# Patient Record
Sex: Male | Born: 1970 | Race: White | Hispanic: No | Marital: Married | Smoking: Never smoker
Health system: Southern US, Community
[De-identification: ages and names within clinical notes are randomized; demographics above are authoritative.]

## PROBLEM LIST (undated history)

## (undated) DIAGNOSIS — Z9289 Personal history of other medical treatment: Secondary | ICD-10-CM

## (undated) DIAGNOSIS — E785 Hyperlipidemia, unspecified: Secondary | ICD-10-CM

## (undated) DIAGNOSIS — Z8249 Family history of ischemic heart disease and other diseases of the circulatory system: Secondary | ICD-10-CM

## (undated) DIAGNOSIS — I10 Essential (primary) hypertension: Secondary | ICD-10-CM

## (undated) DIAGNOSIS — I2510 Atherosclerotic heart disease of native coronary artery without angina pectoris: Secondary | ICD-10-CM

## (undated) HISTORY — DX: Hyperlipidemia, unspecified: E78.5

## (undated) HISTORY — DX: Personal history of other medical treatment: Z92.89

## (undated) HISTORY — DX: Family history of ischemic heart disease and other diseases of the circulatory system: Z82.49

## (undated) HISTORY — DX: Essential (primary) hypertension: I10

## (undated) HISTORY — DX: Atherosclerotic heart disease of native coronary artery without angina pectoris: I25.10

---
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
## 1994-07-27 HISTORY — PX: HERNIA REPAIR: SHX51

## 2008-07-27 HISTORY — PX: TRANSTHORACIC ECHOCARDIOGRAM: SHX275

## 2008-10-25 HISTORY — PX: CORONARY ANGIOPLASTY WITH STENT PLACEMENT: SHX49

## 2011-06-27 DIAGNOSIS — Z9289 Personal history of other medical treatment: Secondary | ICD-10-CM

## 2011-06-27 HISTORY — DX: Personal history of other medical treatment: Z92.89

## 2012-01-19 ENCOUNTER — Encounter: Payer: Self-pay | Admitting: *Deleted

## 2012-01-19 ENCOUNTER — Other Ambulatory Visit: Payer: Self-pay | Admitting: Cardiology

## 2012-01-19 DIAGNOSIS — R079 Chest pain, unspecified: Secondary | ICD-10-CM

## 2012-01-21 ENCOUNTER — Other Ambulatory Visit (HOSPITAL_COMMUNITY): Payer: Self-pay

## 2013-02-22 ENCOUNTER — Other Ambulatory Visit: Payer: Self-pay | Admitting: Cardiovascular Disease

## 2013-02-22 NOTE — Telephone Encounter (Signed)
Rx was sent to pharmacy electronically. 

## 2013-03-05 ENCOUNTER — Other Ambulatory Visit: Payer: Self-pay | Admitting: Cardiovascular Disease

## 2013-03-06 ENCOUNTER — Other Ambulatory Visit: Payer: Self-pay | Admitting: *Deleted

## 2013-03-06 MED ORDER — PRAVASTATIN SODIUM 80 MG PO TABS: 80.0000 mg | ORAL_TABLET | Freq: Every evening | ORAL | Status: DC

## 2013-03-06 NOTE — Telephone Encounter (Signed)
Rx was sent to pharmacy electronically. 

## 2013-03-17 ENCOUNTER — Encounter: Payer: Self-pay | Admitting: *Deleted

## 2013-03-22 ENCOUNTER — Encounter: Payer: Self-pay | Admitting: Cardiovascular Disease

## 2013-03-23 ENCOUNTER — Encounter: Payer: Self-pay | Admitting: Cardiovascular Disease

## 2013-03-23 ENCOUNTER — Ambulatory Visit (INDEPENDENT_AMBULATORY_CARE_PROVIDER_SITE_OTHER): Payer: No Typology Code available for payment source | Admitting: Cardiovascular Disease

## 2013-03-23 VITALS — BP 126/84 | HR 60 | Resp 14 | Ht 74.0 in | Wt 250.4 lb

## 2013-03-23 DIAGNOSIS — I2510 Atherosclerotic heart disease of native coronary artery without angina pectoris: Secondary | ICD-10-CM

## 2013-03-23 DIAGNOSIS — E785 Hyperlipidemia, unspecified: Secondary | ICD-10-CM

## 2013-03-23 DIAGNOSIS — I10 Essential (primary) hypertension: Secondary | ICD-10-CM

## 2013-03-23 DIAGNOSIS — K429 Umbilical hernia without obstruction or gangrene: Secondary | ICD-10-CM

## 2013-03-23 DIAGNOSIS — E782 Mixed hyperlipidemia: Secondary | ICD-10-CM

## 2013-03-23 DIAGNOSIS — F329 Major depressive disorder, single episode, unspecified: Secondary | ICD-10-CM

## 2013-03-23 DIAGNOSIS — Z79899 Other long term (current) drug therapy: Secondary | ICD-10-CM

## 2013-03-23 MED ORDER — PRAVASTATIN SODIUM 80 MG PO TABS: 80.0000 mg | ORAL_TABLET | Freq: Every evening | ORAL | Status: DC

## 2013-03-23 MED ORDER — LOSARTAN POTASSIUM 50 MG PO TABS: 50.0000 mg | ORAL_TABLET | Freq: Every day | ORAL | Status: DC

## 2013-03-23 MED ORDER — SERTRALINE HCL 100 MG PO TABS: 100.0000 mg | ORAL_TABLET | Freq: Every day | ORAL | Status: DC

## 2013-03-23 NOTE — Patient Instructions (Addendum)
Your physician recommends that you schedule a follow-up appointment in: 12 months Your physician encouraged you to lose weight for better health. Your physician recommends that you return for lab work when fasting

## 2013-03-26 ENCOUNTER — Encounter: Payer: Self-pay | Admitting: Cardiovascular Disease

## 2013-03-26 DIAGNOSIS — F325 Major depressive disorder, single episode, in full remission: Secondary | ICD-10-CM | POA: Insufficient documentation

## 2013-03-26 DIAGNOSIS — E7800 Pure hypercholesterolemia, unspecified: Secondary | ICD-10-CM | POA: Insufficient documentation

## 2013-03-26 DIAGNOSIS — F329 Major depressive disorder, single episode, unspecified: Secondary | ICD-10-CM | POA: Insufficient documentation

## 2013-03-26 DIAGNOSIS — E785 Hyperlipidemia, unspecified: Secondary | ICD-10-CM | POA: Insufficient documentation

## 2013-03-26 DIAGNOSIS — K429 Umbilical hernia without obstruction or gangrene: Secondary | ICD-10-CM

## 2013-03-26 DIAGNOSIS — I2510 Atherosclerotic heart disease of native coronary artery without angina pectoris: Secondary | ICD-10-CM | POA: Insufficient documentation

## 2013-03-26 DIAGNOSIS — F32A Depression, unspecified: Secondary | ICD-10-CM

## 2013-03-26 DIAGNOSIS — F324 Major depressive disorder, single episode, in partial remission: Secondary | ICD-10-CM | POA: Insufficient documentation

## 2013-03-26 DIAGNOSIS — I10 Essential (primary) hypertension: Secondary | ICD-10-CM

## 2013-03-26 HISTORY — DX: Essential (primary) hypertension: I10

## 2013-03-26 HISTORY — DX: Umbilical hernia without obstruction or gangrene: K42.9

## 2013-03-26 HISTORY — DX: Depression, unspecified: F32.A

## 2013-03-26 NOTE — Assessment & Plan Note (Addendum)
Time to reassess lipid levels. Encouraged to lose weight for better health. Discussed diet and exercise.

## 2013-03-26 NOTE — Assessment & Plan Note (Signed)
Asymptomatic. 10/2008 xience 3.5x75mm stent to LAD. Normal LV function.

## 2013-03-26 NOTE — Assessment & Plan Note (Signed)
Good control

## 2013-03-26 NOTE — Assessment & Plan Note (Signed)
Improved. Paradoxically, reduced libido with sertraline has helped his marriage.

## 2013-03-26 NOTE — Progress Notes (Signed)
Patient ID: Aneudy Champlain, male   DOB: [birth year], 42 y.o.   MRN: [id number]     Reason for office visit CAD follow up  Doing well, no angina. Had a recent respiratory infection and developed a small umbilical hernia secondary to coughing. Relatively sedentary, no symptoms mowing the lawn with a push mower.    Allergies  Allergen Reactions  . Simvastatin Other (See Comments)    Myalgias     Current Outpatient Prescriptions  Medication Sig Dispense Refill  . aspirin 325 MG tablet Take 325 mg by mouth daily.      Marland Kitchen losartan (COZAAR) 50 MG tablet Take 1 tablet (50 mg total) by mouth daily.  90 tablet  3  . Multiple Vitamins-Minerals (MULTIVITAMIN PO) Take by mouth daily.      . nitroGLYCERIN (NITROSTAT) 0.4 MG SL tablet Place 0.4 mg under the tongue every 5 (five) minutes as needed for chest pain.      . pravastatin (PRAVACHOL) 80 MG tablet Take 1 tablet (80 mg total) by mouth every evening.  90 tablet  3  . sertraline (ZOLOFT) 100 MG tablet Take 1 tablet (100 mg total) by mouth daily.  90 tablet  3   No current facility-administered medications for this visit.    Past Medical History  Diagnosis Date  . Family history of heart disease   . CAD (coronary artery disease)   . Hyperlipidemia   . Hypertension   . History of nuclear stress test 06/2011    exercise; negative for ischemia, normal pattern of perfusion     Past Surgical History  Procedure Laterality Date  . Coronary angioplasty with stent placement  10/2008    xience 3.5x22mm stent to LAD [facility])  . Appendectomy  1977  . Hernia repair  1996  . Transthoracic echocardiogram  2010    EF ~65%; borderline LA enlargement     Family History  Problem Relation Age of Onset  . Heart disease Father     CABG at 12  . Heart disease Paternal Grandmother   . Heart disease Paternal Grandfather     History   Social History  . Marital Status: Married    Spouse Name: N/A    Number of Children: 3  . Years of  Education: N/A   Occupational History  . Not on file.   Social History Main Topics  . Smoking status: Never Smoker   . Smokeless tobacco: Never Used  . Alcohol Use: Yes     Comment: occasional   . Drug Use: No  . Sexual Activity: Not on file   Other Topics Concern  . Not on file   Social History Narrative  . No narrative on file    Review of systems: The patient specifically denies any chest pain at rest exertion, dyspnea at rest or with exertion, orthopnea, paroxysmal nocturnal dyspnea, syncope, palpitations, focal neurological deficits, intermittent claudication, lower extremity edema, unexplained weight gain, cough, hemoptysis or wheezing.   PHYSICAL EXAM BP 126/84  Pulse 60  Resp 14  Ht 6\' 2"  (1.88 m)  Wt 250 lb 6.4 oz (113.581 kg)  BMI 32.14 kg/m2  General: Alert, oriented x3, no distress Head: no evidence of trauma, PERRL, EOMI, no exophtalmos or lid lag, no myxedema, no xanthelasma; normal ears, nose and oropharynx Neck: normal jugular venous pulsations and no hepatojugular reflux; brisk carotid pulses without delay and no carotid bruits Chest: clear to auscultation, no signs of consolidation by percussion or palpation, normal fremitus, symmetrical and  full respiratory excursions Cardiovascular: normal position and quality of the apical impulse, regular rhythm, normal first and second heart sounds, no murmurs, rubs or gallops Abdomen: no tenderness or distention, no masses by palpation, no abnormal pulsatility or arterial bruits, normal bowel sounds, no hepatosplenomegaly. Tiny readily reducible umbilical hernia, evident with cough. Extremities: no clubbing, cyanosis or edema; 2+ radial, ulnar and brachial pulses bilaterally; 2+ right femoral, posterior tibial and dorsalis pedis pulses; 2+ left femoral, posterior tibial and dorsalis pedis pulses; no subclavian or femoral bruits Neurological: grossly nonfocal   EKG: NSR, normal   ASSESSMENT AND PLAN HTN  (hypertension) Good control.  Hyperlipidemia Time to reassess lipid levels. Encouraged to lose weight for better health. Discussed diet and exercise.  Depression Improved. Paradoxically, reduced libido with sertraline has helped his marriage.  CAD (coronary artery disease) Asymptomatic. 10/2008 xience 3.5x52mm stent to LAD. Normal LV function.  Umbilical hernia Small, no enteric content. Observe. Avoid excessive straining.   Orders Placed This Encounter  Procedures  . Comp Met (CMET)  . Lipid Profile  . EKG 12-Lead   Meds ordered this encounter  Medications  . losartan (COZAAR) 50 MG tablet    Sig: Take 1 tablet (50 mg total) by mouth daily.    Dispense:  90 tablet    Refill:  3    Patient needs to call our office to schedule an appointment.  . sertraline (ZOLOFT) 100 MG tablet    Sig: Take 1 tablet (100 mg total) by mouth daily.    Dispense:  90 tablet    Refill:  3  . pravastatin (PRAVACHOL) 80 MG tablet    Sig: Take 1 tablet (80 mg total) by mouth every evening.    Dispense:  90 tablet    Refill:  3    Patient needs appointment for future refills.    Junious Silk, MD, [facility] [phone] office [phone] pager

## 2013-03-26 NOTE — Assessment & Plan Note (Signed)
Small, no enteric content. Observe. Avoid excessive straining.

## 2013-03-30 ENCOUNTER — Other Ambulatory Visit: Payer: Self-pay | Admitting: Cardiovascular Disease

## 2013-03-31 NOTE — Telephone Encounter (Signed)
Rx was sent to pharmacy electronically. 

## 2013-06-18 ENCOUNTER — Other Ambulatory Visit: Payer: Self-pay | Admitting: Cardiovascular Disease

## 2013-06-19 NOTE — Telephone Encounter (Signed)
Rx was sent to pharmacy electronically. 

## 2013-08-29 ENCOUNTER — Other Ambulatory Visit: Payer: Self-pay | Admitting: Cardiovascular Disease

## 2013-08-29 NOTE — Telephone Encounter (Signed)
Please advise 

## 2013-11-21 ENCOUNTER — Telehealth: Payer: Self-pay | Admitting: Cardiovascular Disease

## 2013-11-21 NOTE — Telephone Encounter (Signed)
Returned call and pt verified x 2.  Pt stated losartan doesn't seem to be working as effective.  Stated BP has been around 155/95.  Stated he has noticed the increase over the past week.  Denied CP, SOB, HA, dizziness or other symptoms.   Advised BP check since pt has not been seen since August 2014.  Pt agreed and appt scheduled for tomorrow (4/29) at 2:40pm w/ PharmD.  Pt agreed to bring in a list of ALL meds Rx and OTC and BP monitor.

## 2013-11-21 NOTE — Telephone Encounter (Signed)
Please call- question about his Losartan.

## 2013-11-22 ENCOUNTER — Encounter: Payer: Self-pay | Admitting: Pharmacist Clinician (PhC)/ Clinical Pharmacy Specialist

## 2013-11-22 ENCOUNTER — Ambulatory Visit (INDEPENDENT_AMBULATORY_CARE_PROVIDER_SITE_OTHER)
Payer: No Typology Code available for payment source | Admitting: Pharmacist Clinician (PhC)/ Clinical Pharmacy Specialist

## 2013-11-22 VITALS — BP 138/90 | HR 68 | Ht 74.0 in | Wt 247.4 lb

## 2013-11-22 DIAGNOSIS — I10 Essential (primary) hypertension: Secondary | ICD-10-CM

## 2013-11-22 MED ORDER — LOSARTAN POTASSIUM 50 MG PO TABS: 50.0000 mg | ORAL_TABLET | Freq: Two times a day (BID) | ORAL | Status: DC

## 2013-11-22 NOTE — Patient Instructions (Signed)
Return for a a follow up appointment in 1 month  Your blood pressure today is 138\90  Check your blood pressure at home daily (if able) and keep record of the readings.  Take your BP meds as follows - increase losartan to 50mg  twice daily  Bring all of your meds, your BP cuff and your record of home blood pressures to your next appointment.  Exercise as you're able, try to walk approximately 30 minutes per day.  Keep salt intake to a minimum, especially watch canned and prepared boxed foods.  Eat more fresh fruits and vegetables and fewer canned items.  Avoid eating in fast food restaurants.    HOW TO TAKE YOUR BLOOD PRESSURE:   Rest 5 minutes before taking your blood pressure.    Don't smoke or drink caffeinated beverages for at least 30 minutes before.   Take your blood pressure before (not after) you eat.   Sit comfortably with your back supported and both feet on the floor (don't cross your legs).   Elevate your arm to heart level on a table or a desk.   Use the proper sized cuff. It should fit smoothly and snugly around your bare upper arm. There should be enough room to slip a fingertip under the cuff. The bottom edge of the cuff should be 1 inch above the crease of the elbow.   Ideally, take 3 measurements at one sitting and record the average.

## 2013-11-22 NOTE — Progress Notes (Signed)
     11/22/2013 Francisco White 1970/11/11 [id number]   HPI:  Francisco White is a 43 y.o. male patient of Dr.Croitoru, with a PMH below who presents today for a blood pressure check.  He has been treated for hypertension with losartan 50mg  since 2012 and only just recently noticed that his home blood pressure readings were trending into the 150s/90s.  He denies any CP, SOB, HA or dizziness.  He comes into the office today with his home BP machine.  He does not use any tobacco products, but does admit to drinking 3-4 beers and 2-3 cups of coffee per day.  Most of his meals are eaten at home, and while his mother-in-law, who does the cooking, does not add salt, he does keep a salt shaker on the table to salt his own food.  His family history is significant in that both his father and paternal grandfather both had hypertension.  He gets no regular exercise other than some evening walks with his wife and mowing his lawn.  He owns his own business and states that it has been more stressful in the past month or two.   Current Outpatient Prescriptions  Medication Sig Dispense Refill  . aspirin 325 MG tablet Take 325 mg by mouth daily.      Marland Kitchen. losartan (COZAAR) 50 MG tablet TAKE 1 TABLET BY MOUTH DAILY  30 tablet  6  . nitroGLYCERIN (NITROSTAT) 0.4 MG SL tablet Place 0.4 mg under the tongue every 5 (five) minutes as needed for chest pain.      . pravastatin (PRAVACHOL) 80 MG tablet TAKE 1 TABLET BY MOUTH EVERY EVENING  90 tablet  2  . sertraline (ZOLOFT) 100 MG tablet TAKE ONE TABLET BY MOUTH DAILY  90 tablet  2   No current facility-administered medications for this visit.    Allergies  Allergen Reactions  . Simvastatin Other (See Comments)    Myalgias     Past Medical History  Diagnosis Date  . Family history of heart disease   . CAD (coronary artery disease)   . Hyperlipidemia   . Hypertension   . History of nuclear stress test 06/2011    exercise; negative for ischemia, normal pattern of  perfusion     Blood pressure 138/90, pulse 68, height 6\' 2"  (1.88 m), weight 247 lb 6.4 oz (112.22 kg).   ASSESSMENT AND PLAN: Today [name]'s blood pressure is actually just within guidelines at 138/90.  His home BP cuff read about 10 points higher at 148/99.  We had a long discussion about the changes in BP with cuts in sodium and increases in exercise.  I encouraged him to work on these areas  However I find that losartan is usually not effective for a full 24 hours, so I will go ahead and increase his dose to 50mg  twice daily.  I have asked that he continue with home blood pressure checks and return in one month.    Phillips HayKristin Alvstad PharmD CPP [facility]

## 2014-01-22 ENCOUNTER — Other Ambulatory Visit: Payer: Self-pay | Admitting: *Deleted

## 2014-01-28 ENCOUNTER — Other Ambulatory Visit: Payer: Self-pay | Admitting: Cardiovascular Disease

## 2014-01-29 NOTE — Telephone Encounter (Signed)
Rx was sent to pharmacy electronically. 

## 2014-02-01 ENCOUNTER — Telehealth: Payer: Self-pay | Admitting: Cardiovascular Disease

## 2014-02-01 ENCOUNTER — Other Ambulatory Visit: Payer: Self-pay | Admitting: *Deleted

## 2014-02-01 MED ORDER — LOSARTAN POTASSIUM 50 MG PO TABS: 50.0000 mg | ORAL_TABLET | Freq: Two times a day (BID) | ORAL | Status: DC

## 2014-02-01 NOTE — Telephone Encounter (Signed)
Pt still have not received his Losartan,this have been going on for quite a while. [facility] [address] [facility]

## 2014-04-15 ENCOUNTER — Other Ambulatory Visit: Payer: Self-pay | Admitting: Cardiovascular Disease

## 2014-04-16 NOTE — Telephone Encounter (Signed)
Rx was sent to pharmacy electronically. 

## 2014-05-11 LAB — COMPREHENSIVE METABOLIC PANEL
ALK PHOS: 49 U/L (ref 39–117)
ALT: 22 U/L (ref 0–53)
AST: 20 U/L (ref 0–37)
Albumin: 4.7 g/dL (ref 3.5–5.2)
BUN: 15 mg/dL (ref 6–23)
CO2: 26 mEq/L (ref 19–32)
CREATININE: 1.16 mg/dL (ref 0.50–1.35)
Calcium: 9.8 mg/dL (ref 8.4–10.5)
Chloride: 105 mEq/L (ref 96–112)
GLUCOSE: 95 mg/dL (ref 70–99)
Potassium: 4.1 mEq/L (ref 3.5–5.3)
Sodium: 141 mEq/L (ref 135–145)
Total Bilirubin: 0.8 mg/dL (ref 0.2–1.2)
Total Protein: 6.9 g/dL (ref 6.0–8.3)

## 2014-07-02 ENCOUNTER — Other Ambulatory Visit: Payer: Self-pay | Admitting: Cardiovascular Disease

## 2014-07-02 MED ORDER — PRAVASTATIN SODIUM 80 MG PO TABS
80.0000 mg | ORAL_TABLET | Freq: Every evening | ORAL | Status: DC
Start: 2014-07-02 — End: 2014-07-06

## 2014-07-02 NOTE — Telephone Encounter (Signed)
Rx was sent to pharmacy electronically. 

## 2014-07-04 ENCOUNTER — Ambulatory Visit (INDEPENDENT_AMBULATORY_CARE_PROVIDER_SITE_OTHER): Payer: No Typology Code available for payment source | Admitting: Cardiovascular Disease

## 2014-07-04 ENCOUNTER — Encounter: Payer: Self-pay | Admitting: Cardiovascular Disease

## 2014-07-04 VITALS — BP 114/72 | HR 66 | Resp 16 | Ht 74.0 in | Wt 251.3 lb

## 2014-07-04 DIAGNOSIS — E7800 Pure hypercholesterolemia, unspecified: Secondary | ICD-10-CM

## 2014-07-04 DIAGNOSIS — F329 Major depressive disorder, single episode, unspecified: Secondary | ICD-10-CM

## 2014-07-04 DIAGNOSIS — E785 Hyperlipidemia, unspecified: Secondary | ICD-10-CM

## 2014-07-04 DIAGNOSIS — F32A Depression, unspecified: Secondary | ICD-10-CM

## 2014-07-04 DIAGNOSIS — I10 Essential (primary) hypertension: Secondary | ICD-10-CM

## 2014-07-04 DIAGNOSIS — I2510 Atherosclerotic heart disease of native coronary artery without angina pectoris: Secondary | ICD-10-CM

## 2014-07-04 NOTE — Patient Instructions (Addendum)
Your physician recommends that you return for lab work in: FASTING.  Your physician has requested that you have an exercise tolerance test. For further information please visit [email or web address]. Please also follow instruction sheet, as given. (order given after patient had left the office).  Scheduling will call to arrange.  Dr. Royann Shiversroitoru recommends that you schedule a follow-up appointment in: One year.

## 2014-07-05 LAB — LIPID PANEL
CHOL/HDL RATIO: 4.2 ratio
Cholesterol: 211 mg/dL — ABNORMAL HIGH (ref 0–200)
HDL: 50 mg/dL (ref >39)
LDL CALC: 113 mg/dL — AB (ref 0–99)
Triglycerides: 239 mg/dL — ABNORMAL HIGH (ref <150)
VLDL: 48 mg/dL — AB (ref 0–40)

## 2014-07-06 ENCOUNTER — Telehealth: Payer: Self-pay | Admitting: *Deleted

## 2014-07-06 ENCOUNTER — Encounter: Payer: Self-pay | Admitting: Cardiovascular Disease

## 2014-07-06 MED ORDER — ATORVASTATIN CALCIUM 40 MG PO TABS: 40.0000 mg | ORAL_TABLET | Freq: Every day | ORAL | Status: DC

## 2014-07-06 NOTE — Progress Notes (Signed)
Patient ID: Francisco White, male   DOB: 04-26-1971, 43 y.o.   MRN: [id number]     Reason for office visit CAD, HTN, Hyperlipidemia  No recent changes in [name]'s health. He has infrequent and inconsistent cardiac complaints. Mows his own lawn with a push mower with mild dyspnea and chest tightness, different from previous angina (his previous angina was "indigestion"). His depression is well controlled. He remains mildly obese.  Received a drug eluting 3.5x18 mm Xience LAD stent in April 2010.   Allergies  Allergen Reactions  . Simvastatin Other (See Comments)    Myalgias     Current Outpatient Prescriptions  Medication Sig Dispense Refill  . aspirin 325 MG tablet Take 325 mg by mouth daily.    Marland Kitchen. losartan (COZAAR) 50 MG tablet Take 1 tablet (50 mg total) by mouth 2 (two) times daily. 180 tablet 1  . losartan (COZAAR) 50 MG tablet Take 1 tablet (50 mg total) by mouth 2 (two) times daily. 180 tablet 3  . sertraline (ZOLOFT) 100 MG tablet TAKE ONE TABLET BY MOUTH DAILY 90 tablet 2  . atorvastatin (LIPITOR) 40 MG tablet Take 1 tablet (40 mg total) by mouth daily. 30 tablet 6   No current facility-administered medications for this visit.    Past Medical History  Diagnosis Date  . Family history of heart disease   . CAD (coronary artery disease)   . Hyperlipidemia   . Hypertension   . History of nuclear stress test 06/2011    exercise; negative for ischemia, normal pattern of perfusion     Past Surgical History  Procedure Laterality Date  . Coronary angioplasty with stent placement  10/2008    xience 3.5x11018mm stent to LAD [facility])  . Appendectomy  1977  . Hernia repair  1996  . Transthoracic echocardiogram  2010    EF ~65%; borderline LA enlargement     Family History  Problem Relation Age of Onset  . Heart disease Father     CABG at 652  . Heart disease Paternal Grandmother   . Heart disease Paternal Grandfather     History   Social History  . Marital  Status: Married    Spouse Name: N/A    Number of Children: 3  . Years of Education: N/A   Occupational History  . Not on file.   Social History Main Topics  . Smoking status: Never Smoker   . Smokeless tobacco: Never Used  . Alcohol Use: Yes     Comment: occasional   . Drug Use: No  . Sexual Activity: Not on file   Other Topics Concern  . Not on file   Social History Narrative    Review of systems: The patient specifically denies any chest pain at rest or with exertion, dyspnea at rest or with exertion, orthopnea, paroxysmal nocturnal dyspnea, syncope, palpitations, focal neurological deficits, intermittent claudication, lower extremity edema, unexplained weight gain, cough, hemoptysis or wheezing.  The patient also denies abdominal pain, nausea, vomiting, dysphagia, diarrhea, constipation, polyuria, polydipsia, dysuria, hematuria, frequency, urgency, abnormal bleeding or bruising, fever, chills, unexpected weight changes, mood swings, change in skin or hair texture, change in voice quality, auditory or visual problems, allergic reactions or rashes, new musculoskeletal complaints other than usual "aches and pains".   PHYSICAL EXAM BP 114/72 mmHg  Pulse 66  Ht 6\' 2"  (1.88 m)  Wt 251 lb 4.8 oz (113.989 kg)  BMI 32.25 kg/m2  General: Alert, oriented x3, no distress Head: no evidence of trauma,  PERRL, EOMI, no exophtalmos or lid lag, no myxedema, no xanthelasma; normal ears, nose and oropharynx Neck: normal jugular venous pulsations and no hepatojugular reflux; brisk carotid pulses without delay and no carotid bruits Chest: clear to auscultation, no signs of consolidation by percussion or palpation, normal fremitus, symmetrical and full respiratory excursions Cardiovascular: normal position and quality of the apical impulse, regular rhythm, normal first and second heart sounds, no murmurs, rubs or gallops Abdomen: no tenderness or distention, no masses by palpation, no abnormal  pulsatility or arterial bruits, normal bowel sounds, no hepatosplenomegaly, small reducible periumbilical hernia. Extremities: no clubbing, cyanosis or edema; 2+ radial, ulnar and brachial pulses bilaterally; 2+ right femoral, posterior tibial and dorsalis pedis pulses; 2+ left femoral, posterior tibial and dorsalis pedis pulses; no subclavian or femoral bruits Neurological: grossly nonfocal   EKG: NSR, normal  Lipid Panel     Component Value Date/Time   CHOL 211* 07/04/2014 1207   TRIG 239* 07/04/2014 1207   HDL 50 07/04/2014 1207   CHOLHDL 4.2 07/04/2014 1207   VLDL 48* 07/04/2014 1207   LDLCALC 113* 07/04/2014 1207    BMET    Component Value Date/Time   NA 141 05/11/2014 0802   K 4.1 05/11/2014 0802   CL 105 05/11/2014 0802   CO2 26 05/11/2014 0802   GLUCOSE 95 05/11/2014 0802   BUN 15 05/11/2014 0802   CREATININE 1.16 05/11/2014 0802   CALCIUM 9.8 05/11/2014 0802     ASSESSMENT AND PLAN Hyperlipidemia Switch to atorvastatin 40 mg daily, target LDL preferred<70, definitely want <100.. Encouraged to lose weight for better health. Discussed diet and exercise.  Depression Improved. Stable on sertraline.  CAD (coronary artery disease)  10/2008 xience 3.5x5618mm stent to LAD. Normal LV function. Reasonable to perform ECG stress test for his recent symptoms.  Umbilical hernia Small, no enteric content. Observe.  Orders Placed This Encounter  Procedures  . Lipid panel  . EKG 12-Lead  . Exercise Tolerance Test   No orders of the defined types were placed in this encounter.    Junious SilkROITORU,Rivkah Wolz  Aran Menning, MD, [facility] [phone] office [phone] pager

## 2014-07-06 NOTE — Telephone Encounter (Signed)
-----   Message from Thurmon FairMihai Croitoru, MD sent at 07/05/2014  9:10 PM EST ----- Not happy with lab results. Both LDL and TG are way too high prevent new coronary blockages.Best solution is to switch to Crestor 20 mg daily (more expensive, but He was intolerant of simvastatin and will likely not tolerate atorvastatin either). If he agrees, prescribe, send him a discount card and recheck in 3 months.

## 2014-07-06 NOTE — Telephone Encounter (Signed)
Lab results called to patient.  Patient is self insured and can't afford Crestor.  Would like to try atorvastatin.  Message to Dr. Salena Saner - instructed to switch to atorvastatin 40mg  daily.  [facility]

## 2014-07-06 NOTE — Telephone Encounter (Signed)
Returning your call. This note should have been added to original note.

## 2014-07-08 ENCOUNTER — Other Ambulatory Visit: Payer: Self-pay | Admitting: Cardiovascular Disease

## 2014-08-01 ENCOUNTER — Telehealth (HOSPITAL_COMMUNITY): Payer: Self-pay

## 2014-08-01 NOTE — Telephone Encounter (Signed)
Encounter complete. 

## 2014-08-03 ENCOUNTER — Ambulatory Visit (HOSPITAL_COMMUNITY)
Admission: RE | Admit: 2014-08-03 | Discharge: 2014-08-03 | Disposition: A | Discharge status: Home / Self Care | Payer: No Typology Code available for payment source | Source: Ambulatory Visit | Attending: Cardiology | Admitting: Cardiology

## 2014-08-03 DIAGNOSIS — R5383 Other fatigue: Secondary | ICD-10-CM | POA: Diagnosis not present

## 2014-08-03 DIAGNOSIS — R079 Chest pain, unspecified: Secondary | ICD-10-CM | POA: Diagnosis present

## 2014-08-03 DIAGNOSIS — I2510 Atherosclerotic heart disease of native coronary artery without angina pectoris: Secondary | ICD-10-CM

## 2014-08-03 NOTE — Procedures (Signed)
Exercise Treadmill Test  Pre-Exercise Testing Evaluation NSR, normal tracing  Test  Exercise Tolerance Test Ordering MD: M. Raeleigh Guinn, MD  Interpreting MD:   Unique Test No: 1 Treadmill:  1  Indication for ETT: chest pain - rule out ischemia Contraindication to ETT: No   Stress Modality: exercise - treadmill  Cardiac Imaging Performed: non   Protocol: standard Bruce - maximal  Max BP:  210/74  Max MPHR (bpm): 175 85% MPR (bpm):  148  MPHR obtained (bpm):  169 % MPHR obtained: 96  Reached 85% MPHR (min:sec):  9:15 Total Exercise Time (min-sec):  11:02  Workload in METS: 13.50 Borg Scale:   Reason ETT Terminated:  fatigue    ST Segment Analysis At Rest: normal ST segments - no evidence of significant ST depression With Exercise: no evidence of significant ST depression  Other Information Arrhythmia:  No Angina during ETT:  absent (0) Quality of ETT:  diagnostic  ETT Interpretation:  normal - no evidence of ischemia by ST analysis  Comments: Good exercise tolerance. Normal BP response to exercise.  Exercise tolerance is slightly better compared to 2012.  Thurmon FairMihai Sedale Jenifer, MD, [facility] [phone] office [phone] pager

## 2014-08-21 ENCOUNTER — Telehealth: Payer: Self-pay | Admitting: Cardiovascular Disease

## 2014-08-21 NOTE — Telephone Encounter (Signed)
Normal ETT results given.  Patient states he was expecting the results last week.  I apologized and suggested he call anytime he feels he should be getting any results. Once again, apologized.

## 2014-08-21 NOTE — Telephone Encounter (Signed)
Pt called in stating that he had a stress test done on 1/8 and would like to know the results. Please f/u with pt  Thanks

## 2014-08-21 NOTE — Telephone Encounter (Signed)
Forward to Huntsman CorporationBARBARA LASSISTER CMA

## 2014-08-22 NOTE — Telephone Encounter (Signed)
Can this encounter be closed?

## 2014-08-31 NOTE — Telephone Encounter (Signed)
Can this encounter be closed?

## 2015-01-26 ENCOUNTER — Other Ambulatory Visit: Payer: Self-pay | Admitting: Cardiovascular Disease

## 2015-01-29 NOTE — Telephone Encounter (Signed)
REFILL 

## 2015-02-11 ENCOUNTER — Other Ambulatory Visit: Payer: Self-pay

## 2015-02-11 MED ORDER — LOSARTAN POTASSIUM 50 MG PO TABS: 50.0000 mg | ORAL_TABLET | Freq: Two times a day (BID) | ORAL | Status: DC

## 2015-02-11 NOTE — Telephone Encounter (Signed)
Rx(s) sent to pharmacy electronically.  

## 2015-02-12 ENCOUNTER — Telehealth: Payer: Self-pay | Admitting: Cardiovascular Disease

## 2015-02-12 NOTE — Telephone Encounter (Signed)
Spoke with dawn, the patient is needing clearance to hold his 325 mg aspirin 5 to 7 days prior to vasectomy. They are planning on doing surgery 03/08/15, pending clearance. Aware dr croitoru is out and will return in 2 weeks. She is going to fax clearance form for review. Form received and placed in dr croitoru's box for review when he returns.

## 2015-02-26 ENCOUNTER — Encounter: Payer: Self-pay | Admitting: Cardiovascular Disease

## 2015-03-06 ENCOUNTER — Other Ambulatory Visit: Payer: Self-pay | Admitting: Cardiovascular Disease

## 2015-03-06 NOTE — Telephone Encounter (Signed)
Rx request has been sent to the pharmacy.

## 2015-06-08 ENCOUNTER — Other Ambulatory Visit: Payer: Self-pay | Admitting: Cardiovascular Disease

## 2015-06-11 NOTE — Telephone Encounter (Signed)
Pt requesting a refill on this medication. Please advise °

## 2015-06-11 NOTE — Telephone Encounter (Signed)
Deferred to Dr. Salena Saner for advise

## 2015-07-16 ENCOUNTER — Other Ambulatory Visit: Payer: Self-pay

## 2015-07-16 MED ORDER — ATORVASTATIN CALCIUM 40 MG PO TABS
40.0000 mg | ORAL_TABLET | Freq: Every day | ORAL | Status: DC
Start: 2015-07-16 — End: 2015-08-31

## 2015-08-31 ENCOUNTER — Other Ambulatory Visit: Payer: Self-pay | Admitting: Cardiovascular Disease

## 2015-09-02 NOTE — Telephone Encounter (Signed)
Rx(s) sent to pharmacy electronically.  

## 2015-09-23 ENCOUNTER — Other Ambulatory Visit: Payer: Self-pay | Admitting: Cardiovascular Disease

## 2015-09-24 NOTE — Telephone Encounter (Signed)
REFILL 

## 2015-09-30 ENCOUNTER — Other Ambulatory Visit: Payer: Self-pay | Admitting: Cardiovascular Disease

## 2015-10-02 ENCOUNTER — Ambulatory Visit (INDEPENDENT_AMBULATORY_CARE_PROVIDER_SITE_OTHER): Payer: No Typology Code available for payment source | Admitting: Cardiovascular Disease

## 2015-10-02 ENCOUNTER — Encounter: Payer: Self-pay | Admitting: Cardiovascular Disease

## 2015-10-02 VITALS — BP 152/100 | HR 60 | Ht 74.0 in | Wt 251.6 lb

## 2015-10-02 DIAGNOSIS — F329 Major depressive disorder, single episode, unspecified: Secondary | ICD-10-CM | POA: Diagnosis not present

## 2015-10-02 DIAGNOSIS — E785 Hyperlipidemia, unspecified: Secondary | ICD-10-CM

## 2015-10-02 DIAGNOSIS — I2510 Atherosclerotic heart disease of native coronary artery without angina pectoris: Secondary | ICD-10-CM | POA: Diagnosis not present

## 2015-10-02 DIAGNOSIS — I10 Essential (primary) hypertension: Secondary | ICD-10-CM

## 2015-10-02 DIAGNOSIS — Z79899 Other long term (current) drug therapy: Secondary | ICD-10-CM

## 2015-10-02 DIAGNOSIS — F32A Depression, unspecified: Secondary | ICD-10-CM

## 2015-10-02 LAB — COMPREHENSIVE METABOLIC PANEL
ALK PHOS: 54 U/L (ref 40–115)
ALT: 26 U/L (ref 9–46)
AST: 19 U/L (ref 10–40)
Albumin: 4.7 g/dL (ref 3.6–5.1)
BILIRUBIN TOTAL: 0.8 mg/dL (ref 0.2–1.2)
BUN: 14 mg/dL (ref 7–25)
CO2: 27 mmol/L (ref 20–31)
CREATININE: 1.3 mg/dL (ref 0.60–1.35)
Calcium: 9.7 mg/dL (ref 8.6–10.3)
Chloride: 103 mmol/L (ref 98–110)
GLUCOSE: 87 mg/dL (ref 65–99)
Potassium: 4.3 mmol/L (ref 3.5–5.3)
SODIUM: 138 mmol/L (ref 135–146)
Total Protein: 6.9 g/dL (ref 6.1–8.1)

## 2015-10-02 LAB — LIPID PANEL
CHOL/HDL RATIO: 4 ratio (ref <=5.0)
CHOLESTEROL: 170 mg/dL (ref 125–200)
HDL: 43 mg/dL (ref >=40)
LDL Cholesterol: 99 mg/dL (ref <130)
Triglycerides: 140 mg/dL (ref <150)
VLDL: 28 mg/dL (ref <30)

## 2015-10-02 MED ORDER — LOSARTAN POTASSIUM-HCTZ 100-12.5 MG PO TABS: 1.0000 | ORAL_TABLET | Freq: Every day | ORAL | Status: DC

## 2015-10-02 MED ORDER — ATORVASTATIN CALCIUM 40 MG PO TABS: 40.0000 mg | ORAL_TABLET | Freq: Every day | ORAL | Status: DC

## 2015-10-02 NOTE — Progress Notes (Signed)
Patient ID: Francisco White, male   DOB: [birth year], 45 y.o.   MRN: [id number]    Cardiology Office Note    Date:  10/02/2015   ID:  Francisco White, DOB [birth year], MRN [id number]  PCP:  No PCP Per Patient  Cardiologist:   Sanda Klein, MD   Chief Complaint  Patient presents with  . Annual Exam    no chest pain, no shortness of breath, no edema, no pain or cramping in legs, no lightheadedness or dizziness, no fatigue    History of Present Illness:  Francisco White is a 45 y.o. male with a history of coronary artery disease, now 7 years after placement of a drug-eluting stent to the LAD artery. He has hypertension and hyperlipidemia and has had problems with depression.  He has no cardiovascular complaints. He occasionally exercises on his treadmill and does not have angina or dyspnea. He admits he should exercise more regularly. His weight is essentially unchanged over the last 3 years. Denies palpitations, edema, claudication or focal neurological complaints.  He recently had a vasectomy and has a lot of residual pain. His wife has been diagnosed with BRCA positive cancer of the breast and is currently undergoing neoadjuvant chemotherapy, with a lot of emotional issues at home. Personally, he finds that his mood is stable and other than "road rage" has not had any outbursts of depression or other major psychological issues.  He had a normal treadmill stress test in January 2016 when he was able to exercise for over 11 minutes.  Past Medical History  Diagnosis Date  . Family history of heart disease   . CAD (coronary artery disease)   . Hyperlipidemia   . Hypertension   . History of nuclear stress test 06/2011    exercise; negative for ischemia, normal pattern of perfusion     Past Surgical History  Procedure Laterality Date  . Coronary angioplasty with stent placement  10/2008    xience 3.5x83m stent to LAD ([facility])  . Appendectomy  1977  . Hernia repair  1996  .  Transthoracic echocardiogram  2010    EF ~65%; borderline LA enlargement     Outpatient Prescriptions Prior to Visit  Medication Sig Dispense Refill  . aspirin 325 MG tablet Take 325 mg by mouth daily.    .Marland Kitchenatorvastatin (LIPITOR) 40 MG tablet Take 1 tablet (40 mg total) by mouth daily at 6 PM. NEED OV. 7 tablet 0  . losartan (COZAAR) 50 MG tablet Take 1 tablet (50 mg total) by mouth 2 (two) times daily. 180 tablet 1  . sertraline (ZOLOFT) 100 MG tablet TAKE ONE TABLET BY MOUTH DAILY. NEED OFFICE VISIT FOR MORE REFILLS. 60 tablet 3   No facility-administered medications prior to visit.     Allergies:   Simvastatin   Social History   Social History  . Marital Status: Married    Spouse Name: N/A  . Number of Children: 3  . Years of Education: N/A   Social History Main Topics  . Smoking status: Never Smoker   . Smokeless tobacco: Never Used  . Alcohol Use: Yes     Comment: occasional   . Drug Use: No  . Sexual Activity: Not Asked   Other Topics Concern  . None   Social History Narrative     Family History:  The patient's family history includes Heart disease in his father, paternal grandfather, and paternal grandmother.   ROS:   Please see the history of present illness.  ROS All other systems reviewed and are negative.   PHYSICAL EXAM:   VS:  There were no vitals taken for this visit.   GEN: Well nourished, well developed, in no acute distress HEENT: normal Neck: no JVD, carotid bruits, or masses Cardiac: RRR; no murmurs, rubs, or gallops,no edema  Respiratory:  clear to auscultation bilaterally, normal work of breathing GI: soft, nontender, nondistended, + BS MS: no deformity or atrophy Skin: warm and dry, no rash Neuro:  Alert and Oriented x 3, Strength and sensation are intact Psych: euthymic mood, full affect  Wt Readings from Last 3 Encounters:  07/04/14 113.989 kg (251 lb 4.8 oz)  11/22/13 112.22 kg (247 lb 6.4 oz)  03/23/13 113.581 kg (250 lb 6.4  oz)      Studies/Labs Reviewed:   EKG:  EKG is ordered today.  The ekg ordered today demonstrates normal sinus rhythm, normal tracing  Recent Labs: No results found for requested labs within last 365 days.   Lipid Panel    Component Value Date/Time   CHOL 211* 07/04/2014 1207   TRIG 239* 07/04/2014 1207   HDL 50 07/04/2014 1207   CHOLHDL 4.2 07/04/2014 1207   VLDL 48* 07/04/2014 1207   LDLCALC 113* 07/04/2014 1207      ASSESSMENT:    1. Coronary artery disease involving native coronary artery of native heart without angina pectoris   2. Essential hypertension   3. Hyperlipidemia   4. Depression      PLAN:  In order of problems listed above:  1. CAD: Asymptomatic. Focus on risk factor modification. Lose weight, improve blood pressure control, reevaluate lipids, exercise regularly 2. HTN: His blood pressure severely elevated today, also bleeding in part due to pain and due to emotional distress. Nevertheless even when he is relaxed at home his systolic blood pressure is elevated. Will switch to losartan-HCT 100/12.5 and asked him to come back in a month with his home blood pressure monitors to make sure they're adequately calibrated. 3. HLP: The current lipid profile was performed before we switched to atorvastatin. Will reevaluate. Target LDL less than 100, preferably less than 70. Again, weight loss and regular physical exercise be highly beneficial. 4. seems to be very well compensated on current medications. Would advise against discontinuing the SSRI until his home situation is calmer.    Medication Adjustments/Labs and Tests Ordered: Current medicines are reviewed at length with the patient today.  Concerns regarding medicines are outlined above.  Medication changes, Labs and Tests ordered today are listed in the Patient Instructions below. There are no Patient Instructions on file for this visit.     Mikael Spray, MD  10/02/2015 8:24 AM    [facility] [address], [place], [place]  [address] Phone: [phone]; Fax: [phone]

## 2015-10-02 NOTE — Patient Instructions (Signed)
Your physician has recommended you make the following change in your medication:   STOP LOSARTAN  START LOSARTAN/HCTZ 100/12.5 MG TAKE ONE TABLET DAILY.  A NEW RX HAS BEEN SENT TO YOUR PHARMACY  If you need a refill on your cardiac medications before your next appointment, please call your pharmacy.   Your physician recommends that you return for lab work in: [facility]  Your physician recommends that you schedule a follow-up appointment in: ONE MONTH WITH KRISTIN, PHARM-D   Dr. Royann Shiversroitoru recommends that you schedule a follow-up appointment in: ONE YEAR

## 2015-10-02 NOTE — Addendum Note (Signed)
Addended by: Ronnell GuadalajaraLASSITER, Zanita Millman A on: 10/02/2015 09:54 AM   Modules accepted: Orders, Medications

## 2015-11-07 ENCOUNTER — Ambulatory Visit (INDEPENDENT_AMBULATORY_CARE_PROVIDER_SITE_OTHER)
Payer: No Typology Code available for payment source | Admitting: Pharmacist Clinician (PhC)/ Clinical Pharmacy Specialist

## 2015-11-07 ENCOUNTER — Encounter: Payer: Self-pay | Admitting: Pharmacist Clinician (PhC)/ Clinical Pharmacy Specialist

## 2015-11-07 VITALS — BP 112/84 | HR 60 | Ht 74.0 in | Wt 249.8 lb

## 2015-11-07 DIAGNOSIS — I10 Essential (primary) hypertension: Secondary | ICD-10-CM | POA: Diagnosis not present

## 2015-11-07 NOTE — Progress Notes (Signed)
     11/07/2015 Francisco White 10-27-1970 [id number]   HPI:  Francisco White is a 45 y.o. male patient of Dr Royann Shiversroitoru, with a PMH below who presents today for hypertension clinic evaluation.  He was seen by Dr. Royann Shiversroitoru last month with a pressure of 152/100 and his losartan 50 mg was changed to losartan hctz 100/12.5.  Today he reports no side effects or concerns with the medication.  He says he has had hypertension for about 5-6 years, he assumes well controlled, as he has not regularly checked a home BP.  There has been some home stresses lately as his wife is going thru breast cancer treatment and has surgery scheduled for next week.    Cardiac Hx: CAD with stent to LAD, hypertension, hyperlipidemia  Family Hx: father and paternal grandfather both with CABG, father still living at 3072  Social Hx: no tobacco; does admit to 6 beers per day; 2 cups of coffee most mornings  Diet: eats lunch out, mostly fast food, admits to too much salt at home on meals, but believes eats healthier overall at home  Exercise: yard work, home maintenace; has a treadmill but hasn't been on much in past 6 months  Home BP readings: has not checked (although checks wife's BP twice daily)  Current antihypertensive medications: losartan hctz 100/12.5   Wt Readings from Last 3 Encounters:  11/07/15 249 lb 12.8 oz (113.309 kg)  10/02/15 251 lb 9 oz (114.108 kg)  07/04/14 251 lb 4.8 oz (113.989 kg)   BP Readings from Last 3 Encounters:  11/07/15 112/84  10/02/15 152/100  07/04/14 114/72   Pulse Readings from Last 3 Encounters:  11/07/15 60  10/02/15 60  07/04/14 66    Current Outpatient Prescriptions  Medication Sig Dispense Refill  . aspirin 81 MG tablet Take 81 mg by mouth daily.    Marland Kitchen. atorvastatin (LIPITOR) 40 MG tablet Take 1 tablet (40 mg total) by mouth daily at 6 PM. NEED OV. 30 tablet 10  . losartan-hydrochlorothiazide (HYZAAR) 100-12.5 MG tablet Take 1 tablet by mouth daily. 30 tablet 5  .  sertraline (ZOLOFT) 100 MG tablet TAKE ONE TABLET BY MOUTH DAILY. NEED OFFICE VISIT FOR MORE REFILLS. 60 tablet 3   No current facility-administered medications for this visit.    Allergies  Allergen Reactions  . Simvastatin Other (See Comments)    Myalgias     Past Medical History  Diagnosis Date  . Family history of heart disease   . CAD (coronary artery disease)   . Hyperlipidemia   . Hypertension   . History of nuclear stress test 06/2011    exercise; negative for ischemia, normal pattern of perfusion     Blood pressure 112/84, pulse 60, height 6\' 2"  (1.88 m), weight 249 lb 12.8 oz (113.309 kg).    Phillips HayKristin Alvstad PharmD CPP [facility]

## 2015-11-07 NOTE — Assessment & Plan Note (Signed)
Today his BP is much better controlled at 112/84.  His right arm was higher at 128/90.  Will make no changes in medication today.  Did encourage him to cut back on number of beers/day as well as to decrease his salt use at the table.  He did ask several questions about his cholesterol lab results and whether the alcohol has caused any liver damage.  Asked that he check home BP readings occasionally and look for readings that go above 140/90.  If he sees this consistently, he should contact us for medication adjustment.

## 2015-11-07 NOTE — Patient Instructions (Signed)
   Your blood pressure today is 112/84  (goal is < 140/90)  Check your blood pressure at home several times each week and keep record of the readings.  Take your BP meds as follows: continue with the losartan hct 100/12.5 mg tablet  Bring all of your meds, your BP cuff and your record of home blood pressures to your next appointment.  Exercise as you're able, try to walk approximately 30 minutes per day.  Keep salt intake to a minimum, especially watch canned and prepared boxed foods.  Eat more fresh fruits and vegetables and fewer canned items.  Avoid eating in fast food restaurants.    HOW TO TAKE YOUR BLOOD PRESSURE: . Rest 5 minutes before taking your blood pressure. .  Don't smoke or drink caffeinated beverages for at least 30 minutes before. . Take your blood pressure before (not after) you eat. . Sit comfortably with your back supported and both feet on the floor (don't cross your legs). . Elevate your arm to heart level on a table or a desk. . Use the proper sized cuff. It should fit smoothly and snugly around your bare upper arm. There should be enough room to slip a fingertip under the cuff. The bottom edge of the cuff should be 1 inch above the crease of the elbow. . Ideally, take 3 measurements at one sitting and record the average.

## 2016-01-15 ENCOUNTER — Other Ambulatory Visit: Payer: Self-pay | Admitting: Family Medicine

## 2016-01-15 ENCOUNTER — Encounter: Payer: Self-pay | Admitting: Family Medicine

## 2016-01-15 ENCOUNTER — Ambulatory Visit (INDEPENDENT_AMBULATORY_CARE_PROVIDER_SITE_OTHER): Payer: No Typology Code available for payment source | Admitting: Family Medicine

## 2016-01-15 VITALS — BP 120/84 | HR 65 | Temp 98.6°F | Resp 12 | Ht 74.0 in | Wt 248.1 lb

## 2016-01-15 DIAGNOSIS — K429 Umbilical hernia without obstruction or gangrene: Secondary | ICD-10-CM

## 2016-01-15 DIAGNOSIS — Z6831 Body mass index (BMI) 31.0-31.9, adult: Secondary | ICD-10-CM | POA: Diagnosis not present

## 2016-01-15 DIAGNOSIS — I10 Essential (primary) hypertension: Secondary | ICD-10-CM

## 2016-01-15 DIAGNOSIS — N50812 Left testicular pain: Secondary | ICD-10-CM | POA: Diagnosis not present

## 2016-01-15 NOTE — Progress Notes (Addendum)
HPI:   [name] is a 45 y.o. male, who is here today to establish care with me.  Former PCP: not one for 20 years. Last preventive routine visit: not in the past few years.  Hx of CAD, s/p stenting, last OV with cardiologists in 09/2015, annual follow up.    Concerns today: umbilical hernia.  S/P left inguinal hernia repair , which according to pt was done through laparoscopy, once of the incisions right above umbilicus. He states that he has "a lot scar tissue" and hernia is"way open" since then. He can reduce hernia with no problem/pain.  Denies abdominal pain, nausea, vomiting, changes in bowel habits, blood in stool or melena.  He also c/o testicular pain, hard to pint point where or which testicle. According to pt, it  started a couple weeks after vasectomy (01/2015), pain is achy,mild, intermittently, exacerbated by sexual activity. No edema or erythema. He feels like it is steadily getting better but very slowly. No Hx of testicular trauma. Denies urinary symptoms.    Hypertension: Currently he is on Losartan-HCTZ . He is taking medications as instructed, no side effects reported.  He has not noted unusual headache, visual changes, exertional chest pain, dyspnea,  focal weakness, or edema.  Lab Results  Component Value Date   CREATININE 1.30 10/02/2015   BUN 14 10/02/2015   NA 138 10/02/2015   K 4.3 10/02/2015   CL 103 10/02/2015   CO2 27 10/02/2015      Hyperlipidemia: He is on Lipitor 40 mg daily. Following a low fat diet. He has not noted side effects with medication.  Lab Results  Component Value Date   CHOL 170 10/02/2015   HDL 43 10/02/2015   LDLCALC 99 10/02/2015   TRIG 140 10/02/2015   CHOLHDL 4.0 10/02/2015      Depression: Dx a couple years ago, he thinks he is still on medication to control his libido after his wife was Dx and treated for breast cancer.  Currently he is on Zoloft 100 mg daily. Prior medications include:  N/A. Hospitalizations due to psychiatric disorders: denies Hx of bipolar disorder denies Insomnia/sleep disorder no Suicidal thoughts/ideation denies. FHx mother with depression. It has been managed by cardiologists.   Lives with wife.  Current symptoms: denies any depressed symptoms.   Review of Systems  Constitutional: Negative for fever, activity change, appetite change, fatigue and unexpected weight change.  HENT: Negative for nosebleeds, sore throat and trouble swallowing.   Eyes: Negative for redness and visual disturbance.  Respiratory: Negative for apnea, cough, shortness of breath and wheezing.   Cardiovascular: Negative for chest pain, palpitations and leg swelling.  Gastrointestinal: Negative for nausea, vomiting and abdominal pain.       No changes in bowel habits.  Endocrine: Negative for cold intolerance and heat intolerance.  Genitourinary: Positive for testicular pain. Negative for dysuria, frequency, hematuria, flank pain, decreased urine volume, penile swelling and scrotal swelling.  Skin: Negative for color change, rash and wound.  Neurological: Negative for dizziness, seizures, weakness, numbness and headaches.  Psychiatric/Behavioral: Negative for hallucinations, confusion and sleep disturbance. The patient is not nervous/anxious.       Current Outpatient Prescriptions on File Prior to Visit  Medication Sig Dispense Refill  . aspirin 81 MG tablet Take 81 mg by mouth daily.    Marland Kitchen. atorvastatin (LIPITOR) 40 MG tablet Take 1 tablet (40 mg total) by mouth daily at 6 PM. NEED OV. 30 tablet 10  . losartan-hydrochlorothiazide (  HYZAAR) 100-12.5 MG tablet Take 1 tablet by mouth daily. 30 tablet 5  . sertraline (ZOLOFT) 100 MG tablet TAKE ONE TABLET BY MOUTH DAILY. NEED OFFICE VISIT FOR MORE REFILLS. 60 tablet 3   No current facility-administered medications on file prior to visit.     Past Medical History  Diagnosis Date  . Family history of heart disease   .  CAD (coronary artery disease)   . Hyperlipidemia   . Hypertension   . History of nuclear stress test 06/2011    exercise; negative for ischemia, normal pattern of perfusion    Allergies  Allergen Reactions  . Simvastatin Other (See Comments)    Myalgias     Family History  Problem Relation Age of Onset  . Heart disease Father     CABG at 37  . Heart disease Paternal Grandmother   . Heart disease Paternal Grandfather     Social History   Social History  . Marital Status: Married    Spouse Name: N/A  . Number of Children: 3  . Years of Education: N/A   Social History Main Topics  . Smoking status: Never Smoker   . Smokeless tobacco: Never Used  . Alcohol Use: Yes     Comment: occasional   . Drug Use: No  . Sexual Activity: Not Asked   Other Topics Concern  . None   Social History Narrative    Filed Vitals:   01/15/16 0955  BP: 120/84  Pulse: 65  Temp: 98.6 F (37 C)  Resp: 12    Body mass index is 31.84 kg/(m^2).  SpO2 Readings from Last 3 Encounters:  01/15/16 97%     Physical Exam  Constitutional: He is oriented to person, place, and time. He appears well-developed. No distress.  HENT:  Head: Atraumatic.  Eyes: Conjunctivae and EOM are normal. Pupils are equal, round, and reactive to light.  Neck: No tracheal deviation present. No thyroid mass and no thyromegaly present.  Cardiovascular: Normal rate and regular rhythm.   No murmur heard. Pulses:      Dorsalis pedis pulses are 2+ on the right side, and 2+ on the left side.  Respiratory: Effort normal and breath sounds normal. No respiratory distress.  GI: Soft. He exhibits no mass. There is no hepatomegaly. There is no tenderness. A hernia (umbilical, easy to reduce, no tender.) is present. Hernia confirmed negative in the right inguinal area and confirmed negative in the left inguinal area.  Genitourinary: Penis normal. Right testis shows no swelling and no tenderness. Left testis shows  tenderness. Left testis shows no swelling.  No chaperone in room, pt choice. Nodular lesion along epididymo and mildly tender on left testicle, ? Varicocele. No testicular masses.  Musculoskeletal: He exhibits no edema.  Lymphadenopathy:    He has no cervical adenopathy.       Right: No inguinal and no supraclavicular adenopathy present.       Left: No inguinal and no supraclavicular adenopathy present.  Neurological: He is alert and oriented to person, place, and time. He has normal strength.  Skin: Skin is warm. No erythema.  Psychiatric: He has a normal mood and affect. His speech is normal.  Well groomed, good eye contact. Flat affect.      ASSESSMENT AND PLAN:     Talin was seen today for establish care.  Diagnoses and all orders for this visit:  Umbilical hernia without obstruction and without gangrene  Asymptomatic, instructed about warning signs. Surgery referral placed.  -  Ambulatory referral to General Surgery  Testicular pain, left  We discussed possible causes: Epididymal cyst versus varicocele.  Options discussed: urology vs s/s, he would like the latter one since pain is slowly getting better. Further recommendations would be given after testicular ultrasound results are back. Instructed about warning signs.  -     US Scrotum; Future  Essential hypertension  Adequately controlled. No changes in current management. Low salt diet recommended. Eye exam recommended annually.   BMI 31.0-31.9,adult  We discussed benefits of wt loss as well as adverse effects of obesity. Consistency with healthy diet and physical activity recommended. Postion control, healthy diet as well as daily brisk walking for 15-30 min as tolerated.     -Since he is also following with pharmacists and cardiology intermittently around q 6 months, he can have his annual physical here. He can come sooner if new concerns arise.       Ginia Rudell G. [place], MD  [facility]. [place] office.

## 2016-01-15 NOTE — Progress Notes (Signed)
Pre visit review using our clinic review tool, if applicable. No additional management support is needed unless otherwise documented below in the visit note. 

## 2016-01-15 NOTE — Patient Instructions (Signed)
A few things to remember from today's visit:   1. Essential hypertension   2. Umbilical hernia without obstruction and without gangrene   3. Testicular pain, left   Testicular u/s will be arranged as well as surgeon.  Continue healthy diet and regular exercise.Since you are following with cardiologists and pharmacists you can continue following annually.       If you sign-up for My chart, you can communicate easier with us in case you have any question or concern.

## 2016-01-22 ENCOUNTER — Ambulatory Visit
Admission: RE | Admit: 2016-01-22 | Discharge: 2016-01-22 | Disposition: A | Discharge status: Home / Self Care | Payer: No Typology Code available for payment source | Source: Ambulatory Visit | Attending: Family Medicine | Admitting: Family Medicine

## 2016-01-22 ENCOUNTER — Encounter: Payer: Self-pay | Admitting: Family Medicine

## 2016-01-22 DIAGNOSIS — K429 Umbilical hernia without obstruction or gangrene: Secondary | ICD-10-CM

## 2016-01-22 DIAGNOSIS — N50812 Left testicular pain: Secondary | ICD-10-CM

## 2016-04-09 ENCOUNTER — Other Ambulatory Visit: Payer: Self-pay | Admitting: Cardiovascular Disease

## 2016-04-11 ENCOUNTER — Other Ambulatory Visit: Payer: Self-pay | Admitting: Cardiovascular Disease

## 2016-04-13 ENCOUNTER — Telehealth: Payer: Self-pay | Admitting: Family Medicine

## 2016-04-13 ENCOUNTER — Encounter: Payer: Self-pay | Admitting: Cardiovascular Disease

## 2016-04-13 ENCOUNTER — Encounter: Payer: Self-pay | Admitting: Family Medicine

## 2016-04-13 ENCOUNTER — Other Ambulatory Visit: Payer: Self-pay | Admitting: *Deleted

## 2016-04-13 ENCOUNTER — Telehealth: Payer: Self-pay | Admitting: *Deleted

## 2016-04-13 MED ORDER — LOSARTAN POTASSIUM-HCTZ 100-12.5 MG PO TABS: 1.0000 | ORAL_TABLET | Freq: Every day | ORAL | 3 refills | Status: DC

## 2016-04-13 MED ORDER — SERTRALINE HCL 100 MG PO TABS: 100.0000 mg | ORAL_TABLET | Freq: Every day | ORAL | 1 refills | Status: DC

## 2016-04-13 MED ORDER — ATORVASTATIN CALCIUM 40 MG PO TABS: 40.0000 mg | ORAL_TABLET | Freq: Every day | ORAL | 3 refills | Status: DC

## 2016-04-13 MED ORDER — SERTRALINE HCL 100 MG PO TABS: 100.0000 mg | ORAL_TABLET | Freq: Every day | ORAL | 3 refills | Status: DC

## 2016-04-13 NOTE — Telephone Encounter (Signed)
Spoke to patient. Aware of  change in quantity of sertaline medications from 30 day to 90 day supply. Quantity of 90 tablet  phone in the changes to pharmacy.

## 2016-04-13 NOTE — Telephone Encounter (Signed)
° °  Pt request refill of the following:  sertraline (ZOLOFT) 100 MG tablet  atorvastatin (LIPITOR) 40 MG tablet  losartan-hydrochlorothiazide (HYZAAR) 100-12.5 MG tablet  Pt would like a call back. His heart doctor refuse to fill SERTRALINE  Phamacy:

## 2016-04-13 NOTE — Telephone Encounter (Signed)
Patient has called and requested this to be refilled. I made him aware that per the rx refusal, he needs to contact his pcp for this refill. He stated that Dr Royann Shiversroitoru is his pcp.  He went on to say that he feels that he should have been notified if Dr Royann Shiversroitoru would no longer refill this for him, as he has always refilled it in the past and he originally prescribed it for him. He stressed to me that he was very upset with the practice.

## 2016-04-13 NOTE — Telephone Encounter (Signed)
Rx's sent to pharmacy with enough refills until patient comes back for his next appointment. Patient aware.

## 2016-04-13 NOTE — Telephone Encounter (Signed)
-----   Message from Thurmon FairMihai Croitoru, MD sent at 04/13/2016  3:28 PM EDT ----- Jasmine DecemberSharon, Can you please refill 90 days of sertraline for this gentleman? I actually started him on antidepressants and he was supposed to transition the Rx to PCP, but... Thank you! MCr    Sorry, Francisco White. Our office has standing instructions to route requests for noncardiac prescriptions to primary care providers, for all patients. I do understand your predicament. I will ask them to give you three months refill on sertraline and this should give you enough time for the appointment with Dr. SwazilandJordan. Please ask them to refill from there on, so that this does not happen again. Good luck to your wife and best of health to you both. Dr. Salena Saner ===View-only below this line===   ----- Message -----    From: [name]    Sent: 04/13/2016  1:05 PM EDT      To: Thurmon FairMihai Croitoru, MD Subject: Non-Urgent Medical Question  Dr. Royann Shiversroitoru,  Your office has refused to refill my sertraline prescription.    No notice was given to me before that decision.   I am trying to work with your office and Dr. Elvis CoilJordan's office [facility] to get my prescription refilled.   As you know, my wife is fighting breast cancer.  This run-around to get a medicine that is helping me is causing a great deal of stress.    I discussed the medicine with Dr SwazilandJordan but her nurse just told me to schedule another appointment for me to get this medication.  I will likely exhaust my supply waiting to see Dr SwazilandJordan and take away from the time I need to care for my wife.  I am very angry.

## 2016-07-03 ENCOUNTER — Other Ambulatory Visit (INDEPENDENT_AMBULATORY_CARE_PROVIDER_SITE_OTHER): Payer: No Typology Code available for payment source

## 2016-07-03 DIAGNOSIS — Z0000 Encounter for general adult medical examination without abnormal findings: Secondary | ICD-10-CM | POA: Diagnosis not present

## 2016-07-03 LAB — BASIC METABOLIC PANEL
BUN: 14 mg/dL (ref 6–23)
CALCIUM: 9.7 mg/dL (ref 8.4–10.5)
CO2: 28 meq/L (ref 19–32)
Chloride: 103 mEq/L (ref 96–112)
Creatinine, Ser: 1.14 mg/dL (ref 0.40–1.50)
GFR: 72.91 mL/min (ref >60.00)
Glucose, Bld: 79 mg/dL (ref 70–99)
Potassium: 3.9 mEq/L (ref 3.5–5.1)
SODIUM: 141 meq/L (ref 135–145)

## 2016-07-03 LAB — CBC WITH DIFFERENTIAL/PLATELET
BASOS ABS: 0 10*3/uL (ref 0.0–0.1)
Basophils Relative: 0.4 % (ref 0.0–3.0)
Eosinophils Absolute: 0.1 10*3/uL (ref 0.0–0.7)
Eosinophils Relative: 1.3 % (ref 0.0–5.0)
HEMATOCRIT: 45.5 % (ref 39.0–52.0)
HEMOGLOBIN: 15.5 g/dL (ref 13.0–17.0)
LYMPHS PCT: 17.8 % (ref 12.0–46.0)
Lymphs Abs: 1.7 10*3/uL (ref 0.7–4.0)
MCHC: 34 g/dL (ref 30.0–36.0)
MCV: 88.7 fl (ref 78.0–100.0)
MONOS PCT: 7.1 % (ref 3.0–12.0)
Monocytes Absolute: 0.7 10*3/uL (ref 0.1–1.0)
NEUTROS ABS: 7.1 10*3/uL (ref 1.4–7.7)
Neutrophils Relative %: 73.4 % (ref 43.0–77.0)
PLATELETS: 200 10*3/uL (ref 150.0–400.0)
RBC: 5.13 Mil/uL (ref 4.22–5.81)
RDW: 13.3 % (ref 11.5–15.5)
WBC: 9.6 10*3/uL (ref 4.0–10.5)

## 2016-07-03 LAB — POC URINALSYSI DIPSTICK (AUTOMATED)
Bilirubin, UA: NEGATIVE
Glucose, UA: NEGATIVE
Ketones, UA: NEGATIVE
Leukocytes, UA: NEGATIVE
NITRITE UA: NEGATIVE
PH UA: 6
Protein, UA: NEGATIVE
SPEC GRAV UA: 1.02
UROBILINOGEN UA: 1

## 2016-07-03 LAB — HEPATIC FUNCTION PANEL
ALBUMIN: 4.8 g/dL (ref 3.5–5.2)
ALK PHOS: 60 U/L (ref 39–117)
ALT: 24 U/L (ref 0–53)
AST: 17 U/L (ref 0–37)
Bilirubin, Direct: 0.2 mg/dL (ref 0.0–0.3)
TOTAL PROTEIN: 7.1 g/dL (ref 6.0–8.3)
Total Bilirubin: 0.9 mg/dL (ref 0.2–1.2)

## 2016-07-03 LAB — LIPID PANEL
CHOL/HDL RATIO: 4
Cholesterol: 191 mg/dL (ref 0–200)
HDL: 48.6 mg/dL (ref >39.00)
LDL Cholesterol: 124 mg/dL — ABNORMAL HIGH (ref 0–99)
NONHDL: 142.34
Triglycerides: 92 mg/dL (ref 0.0–149.0)
VLDL: 18.4 mg/dL (ref 0.0–40.0)

## 2016-07-03 LAB — TSH: TSH: 1.71 u[IU]/mL (ref 0.35–4.50)

## 2016-07-10 ENCOUNTER — Ambulatory Visit (INDEPENDENT_AMBULATORY_CARE_PROVIDER_SITE_OTHER): Payer: No Typology Code available for payment source | Admitting: Family Medicine

## 2016-07-10 ENCOUNTER — Encounter: Payer: Self-pay | Admitting: Family Medicine

## 2016-07-10 VITALS — BP 112/70 | HR 73 | Resp 12 | Ht 74.0 in | Wt 245.1 lb

## 2016-07-10 DIAGNOSIS — E538 Deficiency of other specified B group vitamins: Secondary | ICD-10-CM | POA: Insufficient documentation

## 2016-07-10 DIAGNOSIS — N509 Disorder of male genital organs, unspecified: Secondary | ICD-10-CM

## 2016-07-10 DIAGNOSIS — R200 Anesthesia of skin: Secondary | ICD-10-CM | POA: Diagnosis not present

## 2016-07-10 DIAGNOSIS — Z0000 Encounter for general adult medical examination without abnormal findings: Secondary | ICD-10-CM

## 2016-07-10 DIAGNOSIS — Z23 Encounter for immunization: Secondary | ICD-10-CM | POA: Diagnosis not present

## 2016-07-10 DIAGNOSIS — N50819 Testicular pain, unspecified: Secondary | ICD-10-CM

## 2016-07-10 LAB — VITAMIN B12: Vitamin B-12: 199 pg/mL — ABNORMAL LOW (ref 211–911)

## 2016-07-10 NOTE — Progress Notes (Signed)
Pre visit review using our clinic review tool, if applicable. No additional management support is needed unless otherwise documented below in the visit note. 

## 2016-07-10 NOTE — Patient Instructions (Signed)
A few things to remember from today's visit:   Numbness of upper extremity - Plan: Vitamin B12  Routine general medical examination at a health care facility  Persistent testicular pain - Plan: Ambulatory referral to Urology    At least 150 minutes of moderate exercise per week, daily brisk walking for 15-30 min is a good exercise option. Healthy diet low in saturated (animal) fats and sweets and consisting of fresh fruits and vegetables, lean meats such as fish and white chicken and whole grains.  - Vaccines:  Tdap vaccine every 10 years.  Shingles vaccine recommended at age 47, could be given after 47 years of age but not sure about insurance coverage.  Pneumonia vaccines:  Prevnar 13 at 65 and Pneumovax at 5766.   -Screening recommendations for low/normal risk males:  Screening for diabetes at age 47-45 and every 3 years.     Colonoscopy for colon cancer screening at age 47 and until age 47.  Prostate cancer screening: some controversy.        Please be sure medication list is accurate. If a new problem present, please set up appointment sooner than planned today.

## 2016-07-10 NOTE — Progress Notes (Signed)
HPI:  [name] is a 45 y.o.male here today for his routine physical examination.  He lives with wife and 2 daughters.  He does not follow a healthy diet consistently and does not exercise regularly.   Chronic medical problems: HTN,HLD,CAD,depression among some.  He had labs done recently:  Lab Results  Component Value Date   CHOL 191 07/03/2016   HDL 48.60 07/03/2016   LDLCALC 124 (H) 07/03/2016   TRIG 92.0 07/03/2016   CHOLHDL 4 07/03/2016   Lab Results  Component Value Date   CREATININE 1.14 07/03/2016   BUN 14 07/03/2016   NA 141 07/03/2016   K 3.9 07/03/2016   CL 103 07/03/2016   CO2 28 07/03/2016     Hx of STD's Denies   -Denies high alcohol intake, tobacco use, or Hx of illicit drug use.  -Concerns and/or follow up today:  Still having scrotal pain, which started a couple weeks after vasectomy (01/2015) and not getting better. He is not sure which testicle is affected and cannot localized pain. He has not noted edema or skin changes. Testicular U/S was done  01/22/16 and negative.  He has not tried to contact his urologists. He would like to see a different urologists.  Numbness on both upper extremities since 08/2014. It is intermittent, starts on hands and spreads to mid arm. Hx of upper left back pain but no more than usual, no radiated. Started around the time he started antihypertensive treatment.  He states that numbness can happened when he is sitting on in bed. Former PCP though it may be related to sleep position but according to pt, it also happens during the day. He thinks he may be compressing arteries at axillar level when he is "tense."  Lab Results  Component Value Date   WBC 9.6 07/03/2016   HGB 15.5 07/03/2016   HCT 45.5 07/03/2016   MCV 88.7 07/03/2016   PLT 200.0 07/03/2016   Lab Results  Component Value Date   TSH 1.71 07/03/2016   Lab Results  Component Value Date   ALT 24 07/03/2016   AST 17 07/03/2016   ALKPHOS 60 07/03/2016   BILITOT 0.9 07/03/2016      Review of Systems  Constitutional: Positive for fatigue (no more than usual). Negative for activity change, appetite change, fever and unexpected weight change.  HENT: Negative for dental problem, hearing loss, mouth sores, nosebleeds, sore throat, trouble swallowing and voice change.   Eyes: Negative for redness and visual disturbance.  Respiratory: Negative for apnea, cough, shortness of breath and wheezing.   Cardiovascular: Negative for chest pain, palpitations and leg swelling.  Gastrointestinal: Negative for abdominal pain, blood in stool, nausea and vomiting.       No changes in bowel habits.  Endocrine: Negative for cold intolerance, heat intolerance, polydipsia, polyphagia and polyuria.  Genitourinary: Positive for testicular pain. Negative for decreased urine volume, difficulty urinating, discharge, dysuria, genital sores, hematuria, penile pain and scrotal swelling.  Musculoskeletal: Positive for back pain. Negative for arthralgias, joint swelling and neck pain.  Skin: Negative for color change and rash.  Neurological: Positive for numbness. Negative for dizziness, syncope, weakness and headaches.  Hematological: Negative for adenopathy. Does not bruise/bleed easily.  Psychiatric/Behavioral: Negative for confusion and sleep disturbance. The patient is nervous/anxious.      Current Outpatient Prescriptions on File Prior to Visit  Medication Sig Dispense Refill  . aspirin 81 MG tablet Take 81 mg by mouth daily.    .Marland Kitchen  atorvastatin (LIPITOR) 40 MG tablet Take 1 tablet (40 mg total) by mouth daily at 6 PM. NEED OV. 30 tablet 3  . losartan-hydrochlorothiazide (HYZAAR) 100-12.5 MG tablet Take 1 tablet by mouth daily. 30 tablet 3  . sertraline (ZOLOFT) 100 MG tablet Take 1 tablet (100 mg total) by mouth daily. 90 tablet 1   No current facility-administered medications on file prior to visit.      Past Medical History:    Diagnosis Date  . CAD (coronary artery disease)   . Family history of heart disease   . History of nuclear stress test 06/2011   exercise; negative for ischemia, normal pattern of perfusion   . Hyperlipidemia   . Hypertension     Allergies  Allergen Reactions  . Simvastatin Other (See Comments)    Myalgias     Family History  Problem Relation Age of Onset  . Heart disease Father     CABG at 39  . Heart disease Paternal Grandmother   . Heart disease Paternal Grandfather   . Depression Mother     Social History   Social History  . Marital status: Married    Spouse name: N/A  . Number of children: 3  . Years of education: N/A   Social History Main Topics  . Smoking status: Never Smoker  . Smokeless tobacco: Never Used  . Alcohol use Yes     Comment: occasional   . Drug use: No  . Sexual activity: Not Asked   Other Topics Concern  . None   Social History Narrative  . None     Vitals:   07/10/16 0659  BP: 112/70  Pulse: 73  Resp: 12   Body mass index is 31.47 kg/m.  O2 sat at RA 98%   Wt Readings from Last 3 Encounters:  07/10/16 245 lb 2 oz (111.2 kg)  01/15/16 248 lb 2 oz (112.5 kg)  11/07/15 249 lb 12.8 oz (113.3 kg)    Physical Exam  Nursing note and vitals reviewed. Constitutional: He is oriented to person, place, and time. He appears well-developed. No distress.  HENT:  Head: Atraumatic.  Mouth/Throat: Oropharynx is clear and moist and mucous membranes are normal.  Eyes: Conjunctivae and EOM are normal. Pupils are equal, round, and reactive to light.  Neck: Normal range of motion. No thyroid mass and no thyromegaly present.  Cardiovascular: Normal rate and regular rhythm.   No murmur heard. Pulses:      Dorsalis pedis pulses are 2+ on the right side, and 2+ on the left side.  Respiratory: Effort normal and breath sounds normal. No respiratory distress.  GI: Soft. He exhibits no mass. There is no tenderness.  Genitourinary:   Genitourinary Comments: No new concerned since last examination, differed to urologists.  Musculoskeletal: He exhibits no edema or tenderness.  No major deformities appreciated and no signs of synovitis.  Lymphadenopathy:    He has no cervical adenopathy.       Right: No supraclavicular adenopathy present.       Left: No supraclavicular adenopathy present.  Neurological: He is alert and oriented to person, place, and time. He has normal strength. No cranial nerve deficit or sensory deficit. Coordination and gait normal.  Reflex Scores:      Bicep reflexes are 2+ on the right side and 2+ on the left side.      Patellar reflexes are 2+ on the right side and 2+ on the left side. Skin: Skin is warm. No erythema.  Psychiatric: He has a normal mood and affect. Cognition and memory are normal.  Well groomed, good eye contact.        ASSESSMENT AND PLAN:   Discussed the following assessment and plan:   Reuel BoomDaniel was seen today for annual exam.  Diagnoses and all orders for this visit:  Routine general medical examination at a health care facility   We discussed the importance of regular physical activity and healthy diet for prevention of chronic illness and/or complications. Preventive guidelines reviewed. Vaccination up to date. Aspirin use discussed and continue for secondary prevention.  Next CPE in 1-2 years.  Numbness of upper extremity  Neurologist examination today normal. We discussed possible causes, because persistent for over a year EMG will be arranged. Instructed about warning signs.  -     Vitamin B12 -     NCV with EMG(electromyography); Future  Persistent testicular pain  Persistent. ? Scarring changes. Urology referral placed.  -     Ambulatory referral to Urology  Need for Tdap vaccination -     Tdap vaccine greater than or equal to 7yo IM       Return in 5 months (on 12/08/2016) for HTN,ddepresion.    Betty G. SwazilandJordan, MD  [facility]. [place] office.

## 2016-07-11 ENCOUNTER — Encounter: Payer: Self-pay | Admitting: Family Medicine

## 2016-07-17 ENCOUNTER — Ambulatory Visit: Payer: No Typology Code available for payment source | Admitting: Emergency Medicine

## 2016-07-17 MED ORDER — CYANOCOBALAMIN 1000 MCG/ML IJ SOLN: 1000.0000 ug | INTRAMUSCULAR | Status: DC

## 2016-07-17 NOTE — Progress Notes (Signed)
Pt came into office for B12 injection. Pt states that he needs a consultation and wanted to speak with his doctor. I informed pt that Dr. SwazilandJordan is out of the office until Wednesday. Pt refused injections and will schedule an appointment.

## 2016-07-21 ENCOUNTER — Encounter: Payer: Self-pay | Admitting: Family Medicine

## 2016-07-22 ENCOUNTER — Ambulatory Visit: Payer: No Typology Code available for payment source | Admitting: Family Medicine

## 2016-08-12 ENCOUNTER — Encounter: Payer: Self-pay | Admitting: Family Medicine

## 2016-08-14 ENCOUNTER — Encounter: Payer: Self-pay | Admitting: Family Medicine

## 2016-09-17 ENCOUNTER — Encounter: Payer: Self-pay | Admitting: Family Medicine

## 2016-09-20 ENCOUNTER — Other Ambulatory Visit: Payer: Self-pay | Admitting: Cardiovascular Disease

## 2016-09-22 ENCOUNTER — Other Ambulatory Visit: Payer: Self-pay | Admitting: Family Medicine

## 2016-10-20 ENCOUNTER — Other Ambulatory Visit: Payer: Self-pay | Admitting: Cardiovascular Disease

## 2017-01-30 ENCOUNTER — Other Ambulatory Visit: Payer: Self-pay | Admitting: Cardiovascular Disease

## 2017-02-01 NOTE — Telephone Encounter (Signed)
Please refill #90 x 1, no refills and tell him needs an appointment. MCr

## 2017-02-01 NOTE — Telephone Encounter (Signed)
Rx(s) sent to pharmacy electronically per MD 

## 2017-02-26 ENCOUNTER — Other Ambulatory Visit: Payer: Self-pay | Admitting: Cardiovascular Disease

## 2017-03-17 ENCOUNTER — Other Ambulatory Visit: Payer: Self-pay | Admitting: Cardiovascular Disease

## 2017-03-24 ENCOUNTER — Other Ambulatory Visit: Payer: Self-pay | Admitting: Cardiovascular Disease

## 2017-04-01 ENCOUNTER — Other Ambulatory Visit: Payer: Self-pay | Admitting: Family Medicine

## 2017-04-06 NOTE — Progress Notes (Signed)
HPI:   Francisco White is a 46 y.o. male, who is here today to follow on some chronic medical problems.  He was last seen on 07/10/2016 f for his routine CPE.  B12 deficiency:  He did not start B12 injections, he is on is on oral B12 but no sure about mcg.  He is still having numbness sensation on upper extremities when he lays down at night on his back, it doesn't happen every night, "it comes in waves." According to patient, this can happen daily for a week but then he doesn't have it for a few weeks. He denies any symptoms during the day or focal weakness. No cervical pain, skin rash, or muscle atrophy. He has had this for years and has been overall is stable. He attributes it to tightness of muscles with his laying down. He tells me that a few months ago he tried one of her wife's Rx of Lorazepam but didn't help.  Hyperlipidemia:  Currently on Atorvastatin 40 mg daily Following a low fat diet: Yes, his wife cooks and usually healthy.  He has not noted side effects with medication.  Lab Results  Component Value Date   CHOL 191 07/03/2016   HDL 48.60 07/03/2016   LDLCALC 124 (H) 07/03/2016   TRIG 92.0 07/03/2016   CHOLHDL 4 07/03/2016    Hypertension:   Currently on Hyzaar 100-12.5 mg daily.  BP readings at home: 130's/80-90. Last eye exam within a year. He follows low salt diet, tries. He is taking medications as instructed, no side effects reported.  He has not noted headache, visual changes, exertional chest pain, dyspnea,or edema. He denies orthopnea or PND.  Lab Results  Component Value Date   CREATININE 1.14 07/03/2016   BUN 14 07/03/2016   NA 141 07/03/2016   K 3.9 07/03/2016   CL 103 07/03/2016   CO2 28 07/03/2016    Hx of CAD, s/p stent. He is also on Aspirin 81 mg daily. He follows with cardiologists, Dr Royann Shiversroitoru. He has not seen him since I saw him last.  Depression: Currently he is on Zoloft 100 mg daily, started around 2015. He  denies suicidal thoughts. He feels like his symptoms are overall is stable.  He thinks that what exacerbates symptoms the most is the fact that he has not been able to have sex intercourse with his wife for about a year, since she was diagnosed with breast cancer. She completed chemotherapy and had hysterectomy with oophorectomy, she has had some chronic pelvic pain. States that they "are still affectionate to each other" and have some "intimacy."   According to patient, his wife has refused counseling.   Review of Systems  Constitutional: Positive for fatigue. Negative for activity change, appetite change, fever and unexpected weight change.  HENT: Negative for nosebleeds, sore throat and trouble swallowing.   Eyes: Negative for redness and visual disturbance.  Respiratory: Negative for cough, shortness of breath and wheezing.   Cardiovascular: Negative for chest pain, palpitations and leg swelling.  Gastrointestinal: Negative for abdominal pain, nausea and vomiting.       O changes in bowel habits.  Endocrine: Negative for cold intolerance and heat intolerance.  Genitourinary: Negative for decreased urine volume, dysuria and hematuria.  Musculoskeletal: Negative for gait problem, myalgias and neck pain.  Skin: Negative for color change, pallor and rash.  Neurological: Positive for numbness. Negative for dizziness, seizures, syncope, weakness and headaches.  Psychiatric/Behavioral: Negative for confusion, sleep disturbance and  suicidal ideas. The patient is nervous/anxious.       Current Outpatient Prescriptions on File Prior to Visit  Medication Sig Dispense Refill  . aspirin 81 MG tablet Take 81 mg by mouth daily.     No current facility-administered medications on file prior to visit.      Past Medical History:  Diagnosis Date  . CAD (coronary artery disease)   . Family history of heart disease   . History of nuclear stress test 06/2011   exercise; negative for ischemia,  normal pattern of perfusion   . Hyperlipidemia   . Hypertension    Allergies  Allergen Reactions  . Simvastatin Other (See Comments)    Myalgias     Social History   Social History  . Marital status: Married    Spouse name: N/A  . Number of children: 3  . Years of education: N/A   Social History Main Topics  . Smoking status: Never Smoker  . Smokeless tobacco: Never Used  . Alcohol use Yes     Comment: occasional   . Drug use: No  . Sexual activity: Not Asked   Other Topics Concern  . None   Social History Narrative  . None    Vitals:   04/07/17 0727  BP: 124/80  Pulse: 90  Resp: 12  SpO2: 97%   Body mass index is 31.38 kg/m.   Physical Exam  Nursing note and vitals reviewed. Constitutional: He is oriented to person, place, and time. He appears well-developed. No distress.  HENT:  Head: Normocephalic and atraumatic.  Mouth/Throat: Oropharynx is clear and moist and mucous membranes are normal.  Eyes: Pupils are equal, round, and reactive to light. Conjunctivae are normal.  Neck: No tracheal deviation present. No thyroid mass and no thyromegaly present.  Cardiovascular: Normal rate and regular rhythm.   No murmur heard. Pulses:      Dorsalis pedis pulses are 2+ on the right side, and 2+ on the left side.  Respiratory: Effort normal and breath sounds normal. No respiratory distress.  GI: Soft. He exhibits no mass. There is no hepatomegaly. There is no tenderness.  Musculoskeletal: He exhibits no edema or tenderness.  Lymphadenopathy:    He has no cervical adenopathy.  Neurological: He is alert and oriented to person, place, and time. He has normal strength. He displays no atrophy and no tremor. No cranial nerve deficit. He exhibits normal muscle tone. Gait normal.  Skin: Skin is warm. No erythema.  Psychiatric: He has a normal mood and affect. Cognition and memory are normal. He expresses no suicidal ideation.  Fairly groomed, good eye contact.     ASSESSMENT AND PLAN:   Ms.Tyler was seen today for follow-up.  Diagnoses and all orders for this visit:  Lab Results  Component Value Date   CHOL 191 04/07/2017   HDL 59.40 04/07/2017   LDLCALC 102 (H) 04/07/2017   TRIG 145.0 04/07/2017   CHOLHDL 3 04/07/2017   Lab Results  Component Value Date   VITAMINB12 242 04/07/2017   Lab Results  Component Value Date   CREATININE 1.15 04/07/2017   BUN 11 04/07/2017   NA 141 04/07/2017   K 4.0 04/07/2017   CL 103 04/07/2017   CO2 26 04/07/2017    Skin sensation disturbance  She is not interested in any further evaluation at this point since he has been stable. He was clearly instructed about warning signs. We will continue following.  Essential hypertension  Adequately controlled. No changes in  current management. DASH-low salt diet recommended. Eye exam recommended annually. F/U in 6 months, before if needed.  -     losartan-hydrochlorothiazide (HYZAAR) 100-12.5 MG tablet; Take 1 tablet by mouth daily.  B12 deficiency  No changes in current dose of B12. For the recommendations would be given according to lab results.  Major depressive disorder with single episode, remission status unspecified  In general symptoms are stable. No changes in current management. Instructed about warning signs. F/U I 6-12 months.  -     sertraline (ZOLOFT) 100 MG tablet; Take 1 tablet (100 mg total) by mouth daily.  Hyperlipidemia, unspecified hyperlipidemia type  No changes in current management, will follow labs done today and will give further recommendations accordingly. Goal < 100. F/U in 6-12 months.  -     atorvastatin (LIPITOR) 40 MG tablet; Take 1 tablet (40 mg total) by mouth daily with supper.  Coronary artery disease involving native coronary artery of native heart without angina pectoris  Asymptomatic. Continue Aspirin 81 mg daily and Atorvastatin. Instructed about warning signs. Instructed to try to set up  an appointment with his cardiologist.     -[name] was advised to return sooner than planned today if new concerns arise.       Alois Colgan G. [place], MD  [facility]. [place] office.

## 2017-04-07 ENCOUNTER — Ambulatory Visit (INDEPENDENT_AMBULATORY_CARE_PROVIDER_SITE_OTHER): Payer: No Typology Code available for payment source | Admitting: Family Medicine

## 2017-04-07 ENCOUNTER — Encounter: Payer: Self-pay | Admitting: Family Medicine

## 2017-04-07 VITALS — BP 124/80 | HR 90 | Resp 12 | Ht 74.0 in | Wt 244.4 lb

## 2017-04-07 DIAGNOSIS — I2510 Atherosclerotic heart disease of native coronary artery without angina pectoris: Secondary | ICD-10-CM

## 2017-04-07 DIAGNOSIS — Z23 Encounter for immunization: Secondary | ICD-10-CM

## 2017-04-07 DIAGNOSIS — E785 Hyperlipidemia, unspecified: Secondary | ICD-10-CM | POA: Diagnosis not present

## 2017-04-07 DIAGNOSIS — E538 Deficiency of other specified B group vitamins: Secondary | ICD-10-CM | POA: Diagnosis not present

## 2017-04-07 DIAGNOSIS — I10 Essential (primary) hypertension: Secondary | ICD-10-CM | POA: Diagnosis not present

## 2017-04-07 DIAGNOSIS — R209 Unspecified disturbances of skin sensation: Secondary | ICD-10-CM | POA: Diagnosis not present

## 2017-04-07 DIAGNOSIS — F329 Major depressive disorder, single episode, unspecified: Secondary | ICD-10-CM

## 2017-04-07 HISTORY — DX: Unspecified disturbances of skin sensation: R20.9

## 2017-04-07 LAB — BASIC METABOLIC PANEL
BUN: 11 mg/dL (ref 6–23)
CHLORIDE: 103 meq/L (ref 96–112)
CO2: 26 mEq/L (ref 19–32)
CREATININE: 1.15 mg/dL (ref 0.40–1.50)
Calcium: 9.9 mg/dL (ref 8.4–10.5)
GFR: 71.95 mL/min (ref >60.00)
GLUCOSE: 108 mg/dL — AB (ref 70–99)
Potassium: 4 mEq/L (ref 3.5–5.1)
Sodium: 141 mEq/L (ref 135–145)

## 2017-04-07 LAB — LIPID PANEL
Cholesterol: 191 mg/dL (ref 0–200)
HDL: 59.4 mg/dL (ref >39.00)
LDL CALC: 102 mg/dL — AB (ref 0–99)
NonHDL: 131.42
Total CHOL/HDL Ratio: 3
Triglycerides: 145 mg/dL (ref 0.0–149.0)
VLDL: 29 mg/dL (ref 0.0–40.0)

## 2017-04-07 LAB — VITAMIN B12: VITAMIN B 12: 242 pg/mL (ref 211–911)

## 2017-04-07 MED ORDER — LOSARTAN POTASSIUM-HCTZ 100-12.5 MG PO TABS: 1.0000 | ORAL_TABLET | Freq: Every day | ORAL | 2 refills | Status: DC

## 2017-04-07 MED ORDER — ATORVASTATIN CALCIUM 40 MG PO TABS: 40.0000 mg | ORAL_TABLET | Freq: Every day | ORAL | 3 refills | Status: DC

## 2017-04-07 MED ORDER — SERTRALINE HCL 100 MG PO TABS: 100.0000 mg | ORAL_TABLET | Freq: Every day | ORAL | 2 refills | Status: DC

## 2017-04-07 NOTE — Patient Instructions (Addendum)
A few things to remember from today's visit:   Essential hypertension - Plan: losartan-hydrochlorothiazide (HYZAAR) 100-12.5 MG tablet  B12 deficiency  Major depressive disorder with single episode, remission status unspecified - Plan: sertraline (ZOLOFT) 100 MG tablet  Hyperlipidemia, unspecified hyperlipidemia type - Plan: atorvastatin (LIPITOR) 40 MG tablet   DASH Eating Plan DASH stands for "Dietary Approaches to Stop Hypertension." The DASH eating plan is a healthy eating plan that has been shown to reduce high blood pressure (hypertension). It may also reduce your risk for type 2 diabetes, heart disease, and stroke. The DASH eating plan may also help with weight loss. What are tips for following this plan? General guidelines  Avoid eating more than 2,300 mg (milligrams) of salt (sodium) a day. If you have hypertension, you may need to reduce your sodium intake to 1,500 mg a day.  Limit alcohol intake to no more than 1 drink a day for nonpregnant women and 2 drinks a day for men. One drink equals 12 oz of beer, 5 oz of wine, or 1 oz of hard liquor.  Work with your health care provider to maintain a healthy body weight or to lose weight. Ask what an ideal weight is for you.  Get at least 30 minutes of exercise that causes your heart to beat faster (aerobic exercise) most days of the week. Activities may include walking, swimming, or biking.  Work with your health care provider or diet and nutrition specialist (dietitian) to adjust your eating plan to your individual calorie needs. Reading food labels  Check food labels for the amount of sodium per serving. Choose foods with less than 5 percent of the Daily Value of sodium. Generally, foods with less than 300 mg of sodium per serving fit into this eating plan.  To find whole grains, look for the word "whole" as the first word in the ingredient list. Shopping  Buy products labeled as "low-sodium" or "no salt added."  Buy fresh  foods. Avoid canned foods and premade or frozen meals. Cooking  Avoid adding salt when cooking. Use salt-free seasonings or herbs instead of table salt or sea salt. Check with your health care provider or pharmacist before using salt substitutes.  Do not fry foods. Cook foods using healthy methods such as baking, boiling, grilling, and broiling instead.  Cook with heart-healthy oils, such as olive, canola, soybean, or sunflower oil. Meal planning   Eat a balanced diet that includes: ? 5 or more servings of fruits and vegetables each day. At each meal, try to fill half of your plate with fruits and vegetables. ? Up to 6-8 servings of whole grains each day. ? Less than 6 oz of lean meat, poultry, or fish each day. A 3-oz serving of meat is about the same size as a deck of cards. One egg equals 1 oz. ? 2 servings of low-fat dairy each day. ? A serving of nuts, seeds, or beans 5 times each week. ? Heart-healthy fats. Healthy fats called Omega-3 fatty acids are found in foods such as flaxseeds and coldwater fish, like sardines, salmon, and mackerel.  Limit how much you eat of the following: ? Canned or prepackaged foods. ? Food that is high in trans fat, such as fried foods. ? Food that is high in saturated fat, such as fatty meat. ? Sweets, desserts, sugary drinks, and other foods with added sugar. ? Full-fat dairy products.  Do not salt foods before eating.  Try to eat at least 2 vegetarian meals  each week.  Eat more home-cooked food and less restaurant, buffet, and fast food.  When eating at a restaurant, ask that your food be prepared with less salt or no salt, if possible. What foods are recommended? The items listed may not be a complete list. Talk with your dietitian about what dietary choices are best for you. Grains Whole-grain or whole-wheat bread. Whole-grain or whole-wheat pasta. Brown rice. Orpah Cobb. Bulgur. Whole-grain and low-sodium cereals. Pita bread. Low-fat,  low-sodium crackers. Whole-wheat flour tortillas. Vegetables Fresh or frozen vegetables (raw, steamed, roasted, or grilled). Low-sodium or reduced-sodium tomato and vegetable juice. Low-sodium or reduced-sodium tomato sauce and tomato paste. Low-sodium or reduced-sodium canned vegetables. Fruits All fresh, dried, or frozen fruit. Canned fruit in natural juice (without added sugar). Meat and other protein foods Skinless chicken or [place]. Ground chicken or [place]. Pork with fat trimmed off. Fish and seafood. Egg whites. Dried beans, peas, or lentils. Unsalted nuts, nut butters, and seeds. Unsalted canned beans. Lean cuts of beef with fat trimmed off. Low-sodium, lean deli meat. Dairy Low-fat (1%) or fat-free (skim) milk. Fat-free, low-fat, or reduced-fat cheeses. Nonfat, low-sodium ricotta or cottage cheese. Low-fat or nonfat yogurt. Low-fat, low-sodium cheese. Fats and oils Soft margarine without trans fats. Vegetable oil. Low-fat, reduced-fat, or light mayonnaise and salad dressings (reduced-sodium). Canola, safflower, olive, soybean, and sunflower oils. Avocado. Seasoning and other foods Herbs. Spices. Seasoning mixes without salt. Unsalted popcorn and pretzels. Fat-free sweets. What foods are not recommended? The items listed may not be a complete list. Talk with your dietitian about what dietary choices are best for you. Grains Baked goods made with fat, such as croissants, muffins, or some breads. Dry pasta or rice meal packs. Vegetables Creamed or fried vegetables. Vegetables in a cheese sauce. Regular canned vegetables (not low-sodium or reduced-sodium). Regular canned tomato sauce and paste (not low-sodium or reduced-sodium). Regular tomato and vegetable juice (not low-sodium or reduced-sodium). Rosita Fire. Olives. Fruits Canned fruit in a light or heavy syrup. Fried fruit. Fruit in cream or butter sauce. Meat and other protein foods Fatty cuts of meat. Ribs. Fried meat. Tomasa Blase. Sausage.  Bologna and other processed lunch meats. Salami. Fatback. Hotdogs. Bratwurst. Salted nuts and seeds. Canned beans with added salt. Canned or smoked fish. Whole eggs or egg yolks. Chicken or [place] with skin. Dairy Whole or 2% milk, cream, and half-and-half. Whole or full-fat cream cheese. Whole-fat or sweetened yogurt. Full-fat cheese. Nondairy creamers. Whipped toppings. Processed cheese and cheese spreads. Fats and oils Butter. Stick margarine. Lard. Shortening. Ghee. Bacon fat. Tropical oils, such as coconut, palm kernel, or palm oil. Seasoning and other foods Salted popcorn and pretzels. Onion salt, garlic salt, seasoned salt, table salt, and sea salt. Worcestershire sauce. Tartar sauce. Barbecue sauce. Teriyaki sauce. Soy sauce, including reduced-sodium. Steak sauce. Canned and packaged gravies. Fish sauce. Oyster sauce. Cocktail sauce. Horseradish that you find on the shelf. Ketchup. Mustard. Meat flavorings and tenderizers. Bouillon cubes. Hot sauce and Tabasco sauce. Premade or packaged marinades. Premade or packaged taco seasonings. Relishes. Regular salad dressings. Where to find more information:  National Heart, Lung, and Blood Institute: PopSteam.is  American Heart Association: www.heart.org Summary  The DASH eating plan is a healthy eating plan that has been shown to reduce high blood pressure (hypertension). It may also reduce your risk for type 2 diabetes, heart disease, and stroke.  With the DASH eating plan, you should limit salt (sodium) intake to 2,300 mg a day. If you have hypertension, you may need to reduce  your sodium intake to 1,500 mg a day.  When on the DASH eating plan, aim to eat more fresh fruits and vegetables, whole grains, lean proteins, low-fat dairy, and heart-healthy fats.  Work with your health care provider or diet and nutrition specialist (dietitian) to adjust your eating plan to your individual calorie needs. This information is not intended to  replace advice given to you by your health care provider. Make sure you discuss any questions you have with your health care provider. Document Released: 07/02/2011 Document Revised: 07/06/2016 Document Reviewed: 07/06/2016 Elsevier Interactive Patient Education  2017 [employer]   Please be sure medication list is accurate. If a new problem present, please set up appointment sooner than planned today.

## 2017-04-09 ENCOUNTER — Encounter: Payer: Self-pay | Admitting: Family Medicine

## 2017-04-15 ENCOUNTER — Encounter: Payer: Self-pay | Admitting: Family Medicine

## 2017-05-03 ENCOUNTER — Other Ambulatory Visit: Payer: Self-pay | Admitting: Cardiovascular Disease

## 2017-05-03 ENCOUNTER — Other Ambulatory Visit: Payer: Self-pay | Admitting: Family Medicine

## 2017-05-03 NOTE — Telephone Encounter (Signed)
Refill refused as was Rx'ed by PCP (see below)  Prescribing Provider Encounter Provider  [place], Betty G, MD [place], Betty G, MD  Medication Detail    Disp Refills Start End   sertraline (ZOLOFT) 100 MG tablet 90 tablet 2 04/07/2017    Sig - Route: Take 1 tablet (100 mg total) by mouth daily. - Oral   Sent to pharmacy as: sertraline (ZOLOFT) 100 MG tablet   E-Prescribing Status: Receipt confirmed by pharmacy (04/07/2017 7:55 AM EDT)

## 2018-02-20 ENCOUNTER — Other Ambulatory Visit: Payer: Self-pay | Admitting: Family Medicine

## 2018-02-20 DIAGNOSIS — F329 Major depressive disorder, single episode, unspecified: Secondary | ICD-10-CM

## 2018-02-20 DIAGNOSIS — I10 Essential (primary) hypertension: Secondary | ICD-10-CM

## 2018-02-27 ENCOUNTER — Encounter: Payer: Self-pay | Admitting: Family Medicine

## 2018-03-01 ENCOUNTER — Encounter: Payer: Self-pay | Admitting: Family Medicine

## 2018-03-01 ENCOUNTER — Ambulatory Visit (INDEPENDENT_AMBULATORY_CARE_PROVIDER_SITE_OTHER): Payer: No Typology Code available for payment source | Admitting: Family Medicine

## 2018-03-01 VITALS — BP 132/84 | HR 87 | Temp 98.7°F | Resp 12 | Ht 74.0 in | Wt 241.1 lb

## 2018-03-01 DIAGNOSIS — F329 Major depressive disorder, single episode, unspecified: Secondary | ICD-10-CM

## 2018-03-01 DIAGNOSIS — E785 Hyperlipidemia, unspecified: Secondary | ICD-10-CM | POA: Diagnosis not present

## 2018-03-01 DIAGNOSIS — E538 Deficiency of other specified B group vitamins: Secondary | ICD-10-CM

## 2018-03-01 DIAGNOSIS — I10 Essential (primary) hypertension: Secondary | ICD-10-CM | POA: Diagnosis not present

## 2018-03-01 MED ORDER — LOSARTAN POTASSIUM-HCTZ 100-12.5 MG PO TABS: 1.0000 | ORAL_TABLET | Freq: Every day | ORAL | 2 refills | Status: DC

## 2018-03-01 MED ORDER — SERTRALINE HCL 100 MG PO TABS: 100.0000 mg | ORAL_TABLET | Freq: Every day | ORAL | 2 refills | Status: DC

## 2018-03-01 NOTE — Assessment & Plan Note (Addendum)
Adequately controlled. No changes in current management. Low-salt and DASH diet to continue. Eye exam recommended annually. F/U in 6 months, before if needed.

## 2018-03-01 NOTE — Patient Instructions (Addendum)
A few things to remember from today's visit:   Essential hypertension - Plan: Comprehensive metabolic panel, losartan-hydrochlorothiazide (HYZAAR) 100-12.5 MG tablet  Hyperlipidemia, unspecified hyperlipidemia type - Plan: Comprehensive metabolic panel  B12 deficiency - Plan: Vitamin B12  Major depressive disorder with single episode, remission status unspecified - Plan: sertraline (ZOLOFT) 100 MG tablet  No changes today. Next visit.  Is come fasting.  Please be sure medication list is accurate. If a new problem present, please set up appointment sooner than planned today.

## 2018-03-01 NOTE — Assessment & Plan Note (Signed)
He denies symptoms, he continues Zoloft to decrease libido. Medication has been well-tolerated. Follow-up in 12 months, before if needed.

## 2018-03-01 NOTE — Assessment & Plan Note (Signed)
He is not fasting today. He prefers to wait until next visit to have lipid panel checked. LDL goal < 100 , ideally <70. Continue Lipitor 40 mg daily. Continue low-fat diet. Follow-up in 6 months.

## 2018-03-01 NOTE — Progress Notes (Signed)
HPI:   Francisco White is a 47 y.o. male, who is here today for 8 months follow up.   He was last seen on 04/07/17.   Hypertension: Currently she is on Hyzaar 100-12.5 mg daily. Home BP readings: He checks BP occasionally, and it is usually "good." Last eye exam:18 months ago  Denies severe/frequent headache, visual changes, chest pain, dyspnea, palpitation, claudication, focal weakness, or edema. Lab Results  Component Value Date   CREATININE 1.15 04/07/2017   BUN 11 04/07/2017   NA 141 04/07/2017   K 4.0 04/07/2017   CL 103 04/07/2017   CO2 26 04/07/2017    Hyperlipidemia: Currently he is on atorvastatin 40 mg daily. History of CAD, he follows with cardiologist.  Lab Results  Component Value Date   CHOL 191 04/07/2017   HDL 59.40 04/07/2017   LDLCALC 102 (H) 04/07/2017   TRIG 145.0 04/07/2017   CHOLHDL 3 04/07/2017   Also on Aspirin 81 mg daily.  Depression: Currently she is on Zoloft 100 mg daily, mainly to decrease libido, not able to have sex intercourse with wife for a couple years now.  He has been on Zoloft since 2015. He lives with his wife, who recently completed chemotherapy due to breast cancer.  She completed chemotherapy 1 to 2 years ago. She is doing PT for vaginismus.  She denies depressed mood or suicidal thoughts.  B12 deficiency:  He is currently on B12 oral supplementation. He has not had numbness on upper extremities for a while.  Lab Results  Component Value Date   VITAMINB12 242 04/07/2017    Review of Systems  Constitutional: Negative for activity change, appetite change, fatigue and fever.  HENT: Negative for nosebleeds, sore throat and trouble swallowing.   Eyes: Negative for redness and visual disturbance.  Respiratory: Negative for apnea, cough, shortness of breath and wheezing.   Cardiovascular: Negative for chest pain, palpitations and leg swelling.  Gastrointestinal: Negative for abdominal pain, nausea and  vomiting.  Genitourinary: Negative for decreased urine volume, dysuria and hematuria.  Musculoskeletal: Negative for gait problem and myalgias.  Skin: Negative for rash and wound.  Neurological: Negative for syncope, weakness, numbness and headaches.  Psychiatric/Behavioral: Negative for confusion. The patient is nervous/anxious.      Current Outpatient Medications on File Prior to Visit  Medication Sig Dispense Refill  . aspirin 81 MG tablet Take 81 mg by mouth daily.    Marland Kitchen atorvastatin (LIPITOR) 40 MG tablet Take 1 tablet (40 mg total) by mouth daily with supper. 90 tablet 3   No current facility-administered medications on file prior to visit.      Past Medical History:  Diagnosis Date  . CAD (coronary artery disease)   . Family history of heart disease   . History of nuclear stress test 06/2011   exercise; negative for ischemia, normal pattern of perfusion   . Hyperlipidemia   . Hypertension    Allergies  Allergen Reactions  . Simvastatin Other (See Comments)    Myalgias     Social History   Socioeconomic History  . Marital status: Married    Spouse name: Not on file  . Number of children: 3  . Years of education: Not on file  . Highest education level: Not on file  Occupational History  . Not on file  Social Needs  . Financial resource strain: Not on file  . Food insecurity:    Worry: Not on file    Inability: Not  on file  . Transportation needs:    Medical: Not on file    Non-medical: Not on file  Tobacco Use  . Smoking status: Never Smoker  . Smokeless tobacco: Never Used  Substance and Sexual Activity  . Alcohol use: Yes    Comment: occasional   . Drug use: No  . Sexual activity: Not on file  Lifestyle  . Physical activity:    Days per week: Not on file    Minutes per session: Not on file  . Stress: Not on file  Relationships  . Social connections:    Talks on phone: Not on file    Gets together: Not on file    Attends religious service: Not  on file    Active member of club or organization: Not on file    Attends meetings of clubs or organizations: Not on file    Relationship status: Not on file  Other Topics Concern  . Not on file  Social History Narrative  . Not on file    Vitals:   03/01/18 1556  BP: 132/84  Pulse: 87  Resp: 12  Temp: 98.7 F (37.1 C)  SpO2: 96%   Body mass index is 30.96 kg/m.   Physical Exam  Nursing note and vitals reviewed. Constitutional: He is oriented to person, place, and time. He appears well-developed. No distress.  HENT:  Head: Normocephalic and atraumatic.  Mouth/Throat: Oropharynx is clear and moist and mucous membranes are normal.  Eyes: Pupils are equal, round, and reactive to light. Conjunctivae are normal.  Cardiovascular: Normal rate and regular rhythm.  No murmur heard. Pulses:      Dorsalis pedis pulses are 2+ on the right side, and 2+ on the left side.  Respiratory: Effort normal and breath sounds normal. No respiratory distress.  GI: Soft. He exhibits no mass. There is no hepatomegaly. There is no tenderness.  Musculoskeletal: He exhibits no edema.  Lymphadenopathy:    He has no cervical adenopathy.  Neurological: He is alert and oriented to person, place, and time. He has normal strength. No cranial nerve deficit. Gait normal.  Skin: Skin is warm. No rash noted. No erythema.  Psychiatric: He has a normal mood and affect. Cognition and memory are normal.  Well groomed, good eye contact.     ASSESSMENT AND PLAN:   Francisco White was seen today for 6 months follow-up.  Orders Placed This Encounter  Procedures  . Comprehensive metabolic panel  . Vitamin B12   Lab Results  Component Value Date   CREATININE 1.30 03/01/2018   BUN 15 03/01/2018   NA 140 03/01/2018   K 3.8 03/01/2018   CL 103 03/01/2018   CO2 25 03/01/2018   Lab Results  Component Value Date   VITAMINB12 242 03/01/2018    HTN (hypertension) Adequately controlled. No changes in  current management. Low-salt and DASH diet to continue. Eye exam recommended annually. F/U in 6 months, before if needed.   B12 deficiency No changes in current treatment. Further recommendations will be given according to B12 results.   Hyperlipidemia He is not fasting today. He prefers to wait until next visit to have lipid panel checked. LDL goal < 100 , ideally <70. Continue Lipitor 40 mg daily. Continue low-fat diet. Follow-up in 6 months.  Depression He denies symptoms, he continues Zoloft to decrease libido. Medication has been well-tolerated. Follow-up in 12 months, before if needed.      Betty G. [place]Jordan, MD  [facility]. [place] office.

## 2018-03-01 NOTE — Assessment & Plan Note (Addendum)
No changes in current treatment. Further recommendations will be given according to B12 results.

## 2018-03-02 LAB — COMPREHENSIVE METABOLIC PANEL
ALBUMIN: 4.9 g/dL (ref 3.5–5.2)
ALK PHOS: 54 U/L (ref 39–117)
ALT: 25 U/L (ref 0–53)
AST: 19 U/L (ref 0–37)
BUN: 15 mg/dL (ref 6–23)
CO2: 25 mEq/L (ref 19–32)
Calcium: 9.8 mg/dL (ref 8.4–10.5)
Chloride: 103 mEq/L (ref 96–112)
Creatinine, Ser: 1.3 mg/dL (ref 0.40–1.50)
GFR: 62.22 mL/min (ref >60.00)
Glucose, Bld: 98 mg/dL (ref 70–99)
POTASSIUM: 3.8 meq/L (ref 3.5–5.1)
Sodium: 140 mEq/L (ref 135–145)
TOTAL PROTEIN: 7.4 g/dL (ref 6.0–8.3)
Total Bilirubin: 0.5 mg/dL (ref 0.2–1.2)

## 2018-03-02 LAB — VITAMIN B12: VITAMIN B 12: 242 pg/mL (ref 211–911)

## 2018-03-04 ENCOUNTER — Encounter: Payer: Self-pay | Admitting: Family Medicine

## 2018-04-26 ENCOUNTER — Other Ambulatory Visit: Payer: Self-pay | Admitting: Family Medicine

## 2018-04-26 DIAGNOSIS — E785 Hyperlipidemia, unspecified: Secondary | ICD-10-CM

## 2018-07-11 ENCOUNTER — Ambulatory Visit (INDEPENDENT_AMBULATORY_CARE_PROVIDER_SITE_OTHER): Payer: No Typology Code available for payment source | Admitting: *Deleted

## 2018-07-11 DIAGNOSIS — E538 Deficiency of other specified B group vitamins: Secondary | ICD-10-CM | POA: Diagnosis not present

## 2018-07-11 MED ORDER — CYANOCOBALAMIN 1000 MCG/ML IJ SOLN
1000.0000 ug | Freq: Once | INTRAMUSCULAR | Status: AC
  Administered 2018-07-11: 1000 ug via INTRAMUSCULAR

## 2018-07-11 NOTE — Progress Notes (Signed)
Per orders of Dr. [place]il[place]Jordan, injection of Cyanocbalamin 1000mcg given by Johnella MoloneyFunderburk, Jo A. Patient tolerated injection well.

## 2018-07-18 ENCOUNTER — Ambulatory Visit (INDEPENDENT_AMBULATORY_CARE_PROVIDER_SITE_OTHER): Payer: No Typology Code available for payment source | Admitting: *Deleted

## 2018-07-18 DIAGNOSIS — E538 Deficiency of other specified B group vitamins: Secondary | ICD-10-CM | POA: Diagnosis not present

## 2018-07-18 DIAGNOSIS — Z23 Encounter for immunization: Secondary | ICD-10-CM

## 2018-07-18 MED ORDER — CYANOCOBALAMIN 1000 MCG/ML IJ SOLN
1000.0000 ug | Freq: Once | INTRAMUSCULAR | Status: AC
  Administered 2018-07-18: 1000 ug via INTRAMUSCULAR

## 2018-07-18 NOTE — Progress Notes (Signed)
Per orders of Dr. [place], injection of b12 and influenza given by Kern Reapachel Yash Cacciola. Patient tolerated injection well.

## 2018-07-23 ENCOUNTER — Other Ambulatory Visit: Payer: Self-pay | Admitting: Family Medicine

## 2018-07-23 DIAGNOSIS — E785 Hyperlipidemia, unspecified: Secondary | ICD-10-CM

## 2018-07-26 ENCOUNTER — Ambulatory Visit (INDEPENDENT_AMBULATORY_CARE_PROVIDER_SITE_OTHER): Payer: No Typology Code available for payment source | Admitting: *Deleted

## 2018-07-26 DIAGNOSIS — E538 Deficiency of other specified B group vitamins: Secondary | ICD-10-CM

## 2018-07-26 MED ORDER — CYANOCOBALAMIN 1000 MCG/ML IJ SOLN
1000.0000 ug | Freq: Once | INTRAMUSCULAR | Status: AC
  Administered 2018-07-26: 1000 ug via INTRAMUSCULAR

## 2018-07-26 NOTE — Progress Notes (Signed)
Per orders of Dr. Jordan, injection of B12 given by Caleel Kiner. Patient tolerated injection well. 

## 2018-08-01 ENCOUNTER — Encounter: Payer: Self-pay | Admitting: Family Medicine

## 2018-08-09 ENCOUNTER — Encounter: Payer: Self-pay | Admitting: Family Medicine

## 2018-08-10 ENCOUNTER — Other Ambulatory Visit: Payer: Self-pay | Admitting: Family Medicine

## 2018-08-10 DIAGNOSIS — E538 Deficiency of other specified B group vitamins: Secondary | ICD-10-CM

## 2018-08-10 MED ORDER — CYANOCOBALAMIN 1000 MCG/ML IJ SOLN: 1000.0000 ug | INTRAMUSCULAR | 0 refills | Status: AC

## 2018-08-31 ENCOUNTER — Other Ambulatory Visit: Payer: Self-pay | Admitting: *Deleted

## 2018-08-31 MED ORDER — HYDROCHLOROTHIAZIDE 12.5 MG PO TABS: 12.5000 mg | ORAL_TABLET | Freq: Every day | ORAL | 3 refills | Status: DC

## 2018-08-31 MED ORDER — LOSARTAN POTASSIUM 100 MG PO TABS: 100.0000 mg | ORAL_TABLET | Freq: Every day | ORAL | 3 refills | Status: DC

## 2018-09-01 ENCOUNTER — Encounter: Payer: Self-pay | Admitting: Adult Health

## 2018-09-01 ENCOUNTER — Ambulatory Visit (INDEPENDENT_AMBULATORY_CARE_PROVIDER_SITE_OTHER): Payer: No Typology Code available for payment source | Admitting: Adult Health

## 2018-09-01 ENCOUNTER — Ambulatory Visit: Payer: Self-pay

## 2018-09-01 VITALS — BP 110/78 | Temp 98.3°F | Wt 238.0 lb

## 2018-09-01 DIAGNOSIS — N5082 Scrotal pain: Secondary | ICD-10-CM | POA: Diagnosis not present

## 2018-09-01 DIAGNOSIS — N4889 Other specified disorders of penis: Secondary | ICD-10-CM | POA: Diagnosis not present

## 2018-09-01 NOTE — Telephone Encounter (Signed)
Pt called with ongoing issues of bruising and pain to his genital area. He gives a HX of vasectomy 3 years ago and has had ongoing issues with this problem. He describes pain as from 2-4.  He states that he has bruises that just show up and today he has a bruise that runs the length of the shaft to the skin under his scrotum. His bruising has always been to the left shaft. He denies intercourse or any reason for this bruising. Pt states that he is also experiencing abdominal pain. He describes it as soreness when he presses on his lower abdomin.  Appointment scheduled per protocol. Care advice read to patient.  Pt verbalized understanding of instructions  Reason for Disposition . [1] MILD pain in penis, scrotum, or testicle AND [2] lasts 2 hours  Answer Assessment - Initial Assessment Questions 1. MECHANISM: "How did the injury happen?" (Injuries to the penis can occur during sexual intercourse or masturbation; the caller may not be willing to mention this)      Nothing to cause 2. ONSET: "When did the injury happen?" (Minutes or hours ago)      3.5 years ago vascectomy 3. LOCATION: "What part of the genitals are injured?"      Bruising left shaft 2 weeks ago now center shaft running to perineum 4. APPEARANCE of INJURY: "What does the injury look like?"      bruising 5. BLEEDING: "Is the injury still bleeding?" If so, ask: "Is it difficult to stop?"      no 6. SIZE: For cuts, bruises, or swelling, ask: "How large is it?" (e.g., inches or centimeters)      bruises 7. PAIN: "Is it painful?" If so, ask: "How bad is the pain?"    (e.g., Scale 1-10; or mild, moderate, severe)     2-4 8. TETANUS: For any breaks in the skin, ask: "When was the last tetanus booster?"    N/A 9. OTHER SYMPTOMS: "Do you have any other symptoms?" (e.g., blood from tip of penis, bloody urine)    urninating without problems  Protocols used: GENITAL INJURY - MALE-A-AH

## 2018-09-01 NOTE — Telephone Encounter (Signed)
Pt is seeing Dr. Ardyth Harps today.

## 2018-09-01 NOTE — Telephone Encounter (Signed)
Will send to nurse as FYI 

## 2018-09-01 NOTE — Progress Notes (Signed)
Subjective:    Patient ID: Francisco White, male    DOB: [birth year], 48 y.o.   MRN: [id number]  HPI  48 year old male who  has a past medical history of CAD (coronary artery disease), Family history of heart disease, History of nuclear stress test (06/2011), Hyperlipidemia, and Hypertension.  This is a patient of Dr. Swaziland who I am seeing in the office today.  He reports having a vasectomy approximately 3 years ago, and unfortunately has had some degree of discomfort in his scrotum ever since.  Reports last having an ultrasound of the scrotum 1-1/2 years ago did not show any cause for his acute pain.  3 weeks ago he was seen by Dr. Evelene Croon at [facility] for second opinion, at which time he was given two injections into the scrotum? For pain relief.  Reports no relief with this modality.  He noticed some bruising around his left testicle and scrotum with tenderness to the left testicle doing after the injections.  Last night he noticed a new bruise that extended from the head of the penis down the shaft into the left testicle.  He denies any trauma or intercourse that could have caused the bruising or discomfort.  He rates his pain as a 2-4 out of 10 in pain is relieved with Tylenol  Review of Systems See HPI   Past Medical History:  Diagnosis Date  . CAD (coronary artery disease)   . Family history of heart disease   . History of nuclear stress test 06/2011   exercise; negative for ischemia, normal pattern of perfusion   . Hyperlipidemia   . Hypertension     Social History   Socioeconomic History  . Marital status: Married    Spouse name: Not on file  . Number of children: 3  . Years of education: Not on file  . Highest education level: Not on file  Occupational History  . Not on file  Social Needs  . Financial resource strain: Not on file  . Food insecurity:    Worry: Not on file    Inability: Not on file  . Transportation needs:    Medical: Not on file   Non-medical: Not on file  Tobacco Use  . Smoking status: Never Smoker  . Smokeless tobacco: Never Used  Substance and Sexual Activity  . Alcohol use: Yes    Comment: occasional   . Drug use: No  . Sexual activity: Not on file  Lifestyle  . Physical activity:    Days per week: Not on file    Minutes per session: Not on file  . Stress: Not on file  Relationships  . Social connections:    Talks on phone: Not on file    Gets together: Not on file    Attends religious service: Not on file    Active member of club or organization: Not on file    Attends meetings of clubs or organizations: Not on file    Relationship status: Not on file  . Intimate partner violence:    Fear of current or ex partner: Not on file    Emotionally abused: Not on file    Physically abused: Not on file    Forced sexual activity: Not on file  Other Topics Concern  . Not on file  Social History Narrative  . Not on file    Past Surgical History:  Procedure Laterality Date  . APPENDECTOMY  1977  . CORONARY ANGIOPLASTY WITH STENT  PLACEMENT  10/2008   xience 3.5x7118mm stent to LAD [facility]([place], [place])  . HERNIA REPAIR  1996  . TRANSTHORACIC ECHOCARDIOGRAM  2010   EF ~65%; borderline LA enlargement     Family History  Problem Relation Age of Onset  . Heart disease Father        CABG at 6352  . Heart disease Paternal Grandmother   . Heart disease Paternal Grandfather   . Depression Mother     Allergies  Allergen Reactions  . Simvastatin Other (See Comments)    Myalgias     Current Outpatient Medications on File Prior to Visit  Medication Sig Dispense Refill  . aspirin 81 MG tablet Take 81 mg by mouth daily.    Marland Kitchen. atorvastatin (LIPITOR) 40 MG tablet TAKE 1 TABLET(40 MG) BY MOUTH DAILY WITH SUPPER 90 tablet 0  . losartan-hydrochlorothiazide (HYZAAR) 100-12.5 MG tablet Take 1 tablet by mouth daily. 90 tablet 2  . sertraline (ZOLOFT) 100 MG tablet Take 1 tablet (100 mg total) by mouth daily. 90 tablet 2     No current facility-administered medications on file prior to visit.     BP 110/78   Temp 98.3 F (36.8 C)   Wt 238 lb (108 kg)   BMI 30.56 kg/m       Objective:   Physical Exam Vitals signs and nursing note reviewed.  Constitutional:      Appearance: He is well-developed.  Abdominal:     General: Bowel sounds are increased.     Palpations: Abdomen is rigid.     Hernia: There is no hernia in the right inguinal area or left inguinal area.  Genitourinary:    Scrotum/Testes: Cremasteric reflex is present.        Right: Mass, tenderness, swelling, testicular hydrocele or varicocele not present.        Left: Tenderness, swelling and varicocele (?) present. Mass not present.     Epididymis:     Right: Not inflamed. No tenderness.     Left: Inflamed. Tenderness present.     Comments: Noted dark purple bruising on the ventral shaft of penis that extends into the scrotum.  He does have a well-healing yellow bruise on the left side of the scrotum with some mild swelling and tenderness to the left testicle.  Skin:    General: Skin is warm and dry.  Neurological:     General: No focal deficit present.     Mental Status: He is alert. He is disoriented.        Assessment & Plan:  1. Bruise of penis -Unknown cause of bruising of penis.  Doubt it is from injection that was 3 weeks ago.  He denies any trauma.  Does not take any blood thinners except for a daily aspirin  2. Scrotal pain -No torsion felt but will get ultrasound of scrotum with Doppler tomorrow to rule out epididymitis, varicocele or hydrocele.  No masses felt on's testicle, low suspicion for testicular cancer - US SCROTUM W/DOPPLER; Future -Likely advised follow-up with urology  Shirline Freesory Othello Sgroi, NP

## 2018-09-02 ENCOUNTER — Ambulatory Visit
Admission: RE | Admit: 2018-09-02 | Discharge: 2018-09-02 | Disposition: A | Discharge status: Home / Self Care | Payer: No Typology Code available for payment source | Source: Ambulatory Visit | Attending: Adult Health | Admitting: Adult Health

## 2018-09-02 ENCOUNTER — Ambulatory Visit (INDEPENDENT_AMBULATORY_CARE_PROVIDER_SITE_OTHER): Payer: No Typology Code available for payment source | Admitting: Family Medicine

## 2018-09-02 ENCOUNTER — Other Ambulatory Visit: Payer: Self-pay | Admitting: *Deleted

## 2018-09-02 VITALS — BP 122/78 | HR 52 | Temp 98.2°F | Resp 12 | Ht 74.0 in | Wt 238.0 lb

## 2018-09-02 DIAGNOSIS — R739 Hyperglycemia, unspecified: Secondary | ICD-10-CM | POA: Diagnosis not present

## 2018-09-02 DIAGNOSIS — E538 Deficiency of other specified B group vitamins: Secondary | ICD-10-CM | POA: Diagnosis not present

## 2018-09-02 DIAGNOSIS — E785 Hyperlipidemia, unspecified: Secondary | ICD-10-CM | POA: Diagnosis not present

## 2018-09-02 DIAGNOSIS — N50812 Left testicular pain: Secondary | ICD-10-CM

## 2018-09-02 DIAGNOSIS — I10 Essential (primary) hypertension: Secondary | ICD-10-CM

## 2018-09-02 DIAGNOSIS — N5082 Scrotal pain: Secondary | ICD-10-CM

## 2018-09-02 LAB — COMPREHENSIVE METABOLIC PANEL
ALBUMIN: 4.7 g/dL (ref 3.5–5.2)
ALT: 22 U/L (ref 0–53)
AST: 16 U/L (ref 0–37)
Alkaline Phosphatase: 52 U/L (ref 39–117)
BUN: 15 mg/dL (ref 6–23)
CHLORIDE: 103 meq/L (ref 96–112)
CO2: 27 mEq/L (ref 19–32)
Calcium: 9.7 mg/dL (ref 8.4–10.5)
Creatinine, Ser: 1.11 mg/dL (ref 0.40–1.50)
GFR: 70.11 mL/min (ref >60.00)
Glucose, Bld: 82 mg/dL (ref 70–99)
Potassium: 4.1 mEq/L (ref 3.5–5.1)
Sodium: 140 mEq/L (ref 135–145)
Total Bilirubin: 0.9 mg/dL (ref 0.2–1.2)
Total Protein: 6.8 g/dL (ref 6.0–8.3)

## 2018-09-02 LAB — LIPID PANEL
CHOLESTEROL: 173 mg/dL (ref 0–200)
HDL: 37.5 mg/dL — ABNORMAL LOW (ref >39.00)
LDL Cholesterol: 111 mg/dL — ABNORMAL HIGH (ref 0–99)
NonHDL: 135.63
Total CHOL/HDL Ratio: 5
Triglycerides: 121 mg/dL (ref 0.0–149.0)
VLDL: 24.2 mg/dL (ref 0.0–40.0)

## 2018-09-02 LAB — VITAMIN B12: VITAMIN B 12: 683 pg/mL (ref 211–911)

## 2018-09-02 LAB — HEMOGLOBIN A1C: Hgb A1c MFr Bld: 5.2 % (ref 4.6–6.5)

## 2018-09-02 MED ORDER — HYDROCHLOROTHIAZIDE 12.5 MG PO TABS: 12.5000 mg | ORAL_TABLET | Freq: Every day | ORAL | 3 refills | Status: DC

## 2018-09-02 MED ORDER — LOSARTAN POTASSIUM 100 MG PO TABS: 100.0000 mg | ORAL_TABLET | Freq: Every day | ORAL | 3 refills | Status: DC

## 2018-09-02 NOTE — Patient Instructions (Signed)
A few things to remember from today's visit:   Essential hypertension - Plan: Comprehensive metabolic panel  Hyperlipidemia, unspecified hyperlipidemia type - Plan: Lipid panel, Comprehensive metabolic panel  B12 deficiency - Plan: Vitamin B12  Hyperglycemia - Plan: Hemoglobin A1c  Testicular pain, left   Please be sure medication list is accurate. If a new problem present, please set up appointment sooner than planned today.

## 2018-09-02 NOTE — Progress Notes (Signed)
HPI:   [name] is a 48 y.o. male, who is here today for 6 months follow up.   He was last seen on 03/01/18.  He was seen yesterday because worsening testicular pain. He has followed with urologist due to similar problem, last visit 3 weeks ago. He received bilateral injection in testicle, which did not help with pain.  Sudden worsening pain and new onset of left testicular edema and ecchymosis. No fever or chills. No urethral discharge or urinary symptoms.  He has testis [place] scheduled for today.   Hyperlipidemia: Currently on Atorvastatin 40 mg daily. Following a low fat diet: Yes.  He has not noted side effects with medication.  Lab Results  Component Value Date   CHOL 191 04/07/2017   HDL 59.40 04/07/2017   LDLCALC 102 (H) 04/07/2017   TRIG 145.0 04/07/2017   CHOLHDL 3 04/07/2017    Hypertension:  Currently on Lisinopril-HCTZ 100-12.5 mg daily. He is taking medications as instructed and has not noted side effects reported.  Negative for unusual headache, visual changes, exertional chest pain, dyspnea,  focal weakness, or edema.   Lab Results  Component Value Date   CREATININE 1.30 03/01/2018   BUN 15 03/01/2018   NA 140 03/01/2018   K 3.8 03/01/2018   CL 103 03/01/2018   CO2 25 03/01/2018   B12 deficiency: He completed B12 1000 mcg weekly x 4. Negative for numbness or tingling. No side effects.  Lab Results  Component Value Date   VITAMINB12 242 03/01/2018   Mildly elevated glucose a few months ago,108. Denies abdominal pain, nausea,vomiting, polydipsia,polyuria, or polyphagia.   Review of Systems  Constitutional: Negative for activity change, appetite change, fatigue and fever.  HENT: Negative for nosebleeds, sore throat and trouble swallowing.   Eyes: Negative for redness and visual disturbance.  Respiratory: Negative for cough, shortness of breath and wheezing.   Cardiovascular: Negative for chest pain, palpitations and leg  swelling.  Gastrointestinal: Negative for abdominal pain, nausea and vomiting.  Genitourinary: Positive for scrotal swelling and testicular pain. Negative for decreased urine volume, dysuria and hematuria.  Neurological: Negative for syncope, weakness and headaches.  Psychiatric/Behavioral: Positive for sleep disturbance. Negative for confusion. The patient is nervous/anxious.     Current Outpatient Medications on File Prior to Visit  Medication Sig Dispense Refill  . aspirin 81 MG tablet Take 81 mg by mouth daily.    Marland Kitchen atorvastatin (LIPITOR) 40 MG tablet TAKE 1 TABLET(40 MG) BY MOUTH DAILY WITH SUPPER 90 tablet 0  . losartan-hydrochlorothiazide (HYZAAR) 100-12.5 MG tablet Take 1 tablet by mouth daily. 90 tablet 2  . sertraline (ZOLOFT) 100 MG tablet Take 1 tablet (100 mg total) by mouth daily. 90 tablet 2   No current facility-administered medications on file prior to visit.      Past Medical History:  Diagnosis Date  . CAD (coronary artery disease)   . Family history of heart disease   . History of nuclear stress test 06/2011   exercise; negative for ischemia, normal pattern of perfusion   . Hyperlipidemia   . Hypertension    Allergies  Allergen Reactions  . Simvastatin Other (See Comments)    Myalgias     Social History   Socioeconomic History  . Marital status: Married    Spouse name: Not on file  . Number of children: 3  . Years of education: Not on file  . Highest education level: Not on file  Occupational History  .  Not on file  Social Needs  . Financial resource strain: Not on file  . Food insecurity:    Worry: Not on file    Inability: Not on file  . Transportation needs:    Medical: Not on file    Non-medical: Not on file  Tobacco Use  . Smoking status: Never Smoker  . Smokeless tobacco: Never Used  Substance and Sexual Activity  . Alcohol use: Yes    Comment: occasional   . Drug use: No  . Sexual activity: Not on file  Lifestyle  . Physical  activity:    Days per week: Not on file    Minutes per session: Not on file  . Stress: Not on file  Relationships  . Social connections:    Talks on phone: Not on file    Gets together: Not on file    Attends religious service: Not on file    Active member of club or organization: Not on file    Attends meetings of clubs or organizations: Not on file    Relationship status: Not on file  Other Topics Concern  . Not on file  Social History Narrative  . Not on file    Vitals:   09/02/18 0730  BP: 122/78  Pulse: (!) 52  Resp: 12  Temp: 98.2 F (36.8 C)  SpO2: 98%   Body mass index is 30.56 kg/m.  Physical Exam  Nursing note and vitals reviewed. Constitutional: He is oriented to person, place, and time. He appears well-developed. No distress.  HENT:  Head: Normocephalic and atraumatic.  Mouth/Throat: Oropharynx is clear and moist and mucous membranes are normal.  Eyes: Pupils are equal, round, and reactive to light. Conjunctivae are normal.  Cardiovascular: Regular rhythm. Bradycardia present.  No murmur heard. Pulses:      Dorsalis pedis pulses are 2+ on the right side and 2+ on the left side.  Respiratory: Effort normal and breath sounds normal. No respiratory distress.  GI: Soft. He exhibits no mass. There is no hepatomegaly. There is no abdominal tenderness.  Genitourinary: Left testis shows swelling and tenderness. Left testis shows no mass.    Genitourinary Comments: Mild edema and ecchymosis left scrotum and extending to posterior aspect of penis. Moderate tenderness upon palpation,no erythema.   Musculoskeletal:        General: No edema.  Lymphadenopathy:    He has no cervical adenopathy.       Right: No inguinal adenopathy present.       Left: No inguinal adenopathy present.  Neurological: He is alert and oriented to person, place, and time. He has normal strength. No cranial nerve deficit. Gait normal.  Skin: Skin is warm. Ecchymosis noted. No rash noted. No  erythema.  See GU.  Psychiatric: He has a normal mood and affect.  Well groomed, good eye contact.     ASSESSMENT AND PLAN:   Francisco White was seen today for 6 months follow-up.  Orders Placed This Encounter  Procedures  . Hemoglobin A1c  . Lipid panel  . Vitamin B12  . Comprehensive metabolic panel   Lab Results  Component Value Date   VITAMINB12 683 09/02/2018   Lab Results  Component Value Date   CHOL 173 09/02/2018   HDL 37.50 (L) 09/02/2018   LDLCALC 111 (H) 09/02/2018   TRIG 121.0 09/02/2018   CHOLHDL 5 09/02/2018   Lab Results  Component Value Date   ALT 22 09/02/2018   AST 16 09/02/2018  ALKPHOS 52 09/02/2018   BILITOT 0.9 09/02/2018   Lab Results  Component Value Date   HGBA1C 5.2 09/02/2018    1. Essential hypertension Adequately controlled. No changes in current management. DASH-low salt diet to continue. Eye exam annually. F/U in 6 months.  2. Hyperlipidemia, unspecified hyperlipidemia type No changes in current management, will follow labs done today and will give further recommendations accordingly.  3. B12 deficiency Will give recommendations according to B12 results.  4. Hyperglycemia Mildly elevated FG. Healthy life style for primary prevention recommended.  5. Testicular pain, left Possible causes discussed. Recommend calling his urologist's office. Pending testicular US today. Instructed about warning signs.     Return in about 6 months (around 03/03/2019) for cpe.    Holliday Sheaffer G. SwazilandJordan, MD  [facility]. [place] office.

## 2018-09-05 ENCOUNTER — Other Ambulatory Visit: Payer: Self-pay | Admitting: *Deleted

## 2018-09-05 ENCOUNTER — Encounter: Payer: Self-pay | Admitting: Family Medicine

## 2018-09-05 ENCOUNTER — Encounter: Payer: Self-pay | Admitting: Adult Health

## 2018-09-05 DIAGNOSIS — N50812 Left testicular pain: Secondary | ICD-10-CM

## 2018-09-05 DIAGNOSIS — N5082 Scrotal pain: Secondary | ICD-10-CM

## 2018-09-05 NOTE — Telephone Encounter (Signed)
Duplicate message. 

## 2018-09-06 ENCOUNTER — Encounter: Payer: Self-pay | Admitting: Family Medicine

## 2018-11-01 ENCOUNTER — Other Ambulatory Visit: Payer: Self-pay | Admitting: Family Medicine

## 2018-11-01 DIAGNOSIS — E785 Hyperlipidemia, unspecified: Secondary | ICD-10-CM

## 2018-11-29 ENCOUNTER — Other Ambulatory Visit: Payer: Self-pay | Admitting: Family Medicine

## 2018-11-29 DIAGNOSIS — F329 Major depressive disorder, single episode, unspecified: Secondary | ICD-10-CM

## 2019-01-30 ENCOUNTER — Other Ambulatory Visit: Payer: Self-pay | Admitting: Family Medicine

## 2019-01-30 DIAGNOSIS — E785 Hyperlipidemia, unspecified: Secondary | ICD-10-CM

## 2019-02-14 ENCOUNTER — Encounter: Payer: Self-pay | Admitting: Family Medicine

## 2019-04-27 ENCOUNTER — Other Ambulatory Visit: Payer: Self-pay | Admitting: Family Medicine

## 2019-04-27 DIAGNOSIS — E785 Hyperlipidemia, unspecified: Secondary | ICD-10-CM

## 2019-08-21 ENCOUNTER — Other Ambulatory Visit: Payer: Self-pay

## 2019-08-21 DIAGNOSIS — E785 Hyperlipidemia, unspecified: Secondary | ICD-10-CM

## 2019-08-21 MED ORDER — ATORVASTATIN CALCIUM 40 MG PO TABS: ORAL_TABLET | ORAL | 1 refills | Status: DC

## 2019-09-10 ENCOUNTER — Other Ambulatory Visit: Payer: Self-pay | Admitting: Family Medicine

## 2019-09-10 DIAGNOSIS — F329 Major depressive disorder, single episode, unspecified: Secondary | ICD-10-CM

## 2019-09-11 ENCOUNTER — Other Ambulatory Visit: Payer: Self-pay | Admitting: Family Medicine

## 2019-12-14 ENCOUNTER — Other Ambulatory Visit: Payer: Self-pay | Admitting: Family Medicine

## 2019-12-14 ENCOUNTER — Encounter: Payer: Self-pay | Admitting: Family Medicine

## 2019-12-14 ENCOUNTER — Other Ambulatory Visit: Payer: Self-pay

## 2019-12-14 ENCOUNTER — Ambulatory Visit (INDEPENDENT_AMBULATORY_CARE_PROVIDER_SITE_OTHER): Payer: No Typology Code available for payment source | Admitting: Family Medicine

## 2019-12-14 VITALS — BP 130/80 | HR 60 | Temp 97.9°F | Wt 230.4 lb

## 2019-12-14 DIAGNOSIS — F329 Major depressive disorder, single episode, unspecified: Secondary | ICD-10-CM

## 2019-12-14 DIAGNOSIS — R079 Chest pain, unspecified: Secondary | ICD-10-CM

## 2019-12-14 NOTE — Progress Notes (Signed)
   Subjective:    Patient ID: Francisco White, male    DOB: 08-03-1970, 49 y.o.   MRN: [id number]  HPI Here for one week of intermittent left sided chest pains. These are very mild and quite brief (aboiut 30 seconds each), and they are not associated with exercise. He describes them as having a "pinching" quality. No SOB or nausea or sweats. No indigestion or heartburn. Interestingly he says he had one of these pains in the exam room just before I joined him today. He notes that he had angioplasty with stent placement in 2020. He had been seeing Dr. Royann Shivers regularly, but his last visit with him was in 2017.    Review of Systems  Constitutional: Negative.   Respiratory: Negative.   Cardiovascular: Positive for chest pain. Negative for palpitations and leg swelling.  Gastrointestinal: Negative.        Objective:   Physical Exam Constitutional:      Appearance: Normal appearance. He is well-developed. He is not ill-appearing.  Cardiovascular:     Rate and Rhythm: Normal rate and regular rhythm.     Pulses: Normal pulses.     Heart sounds: Normal heart sounds.     Comments: EKG today is normal  Pulmonary:     Effort: Pulmonary effort is normal.     Breath sounds: Normal breath sounds.  Chest:     Chest wall: No tenderness.  Neurological:     Mental Status: He is alert.           Assessment & Plan:  Chest pains, almost certainly not of cardiac origin. Likely musculoskeletal. However we will arrange for him to see Dr. Royann Shivers again soon. Gershon Crane, MD

## 2019-12-15 ENCOUNTER — Telehealth: Payer: Self-pay | Admitting: Family Medicine

## 2019-12-15 NOTE — Telephone Encounter (Signed)
Dr. Swaziland, I saw this patient yesterday for chest pain which sounded non-cardiac, but he wanted a referral to see Dr. Royann Shivers in Cardiology (he saw him a few years ago). I told him the referral would be better coming from you than me. Would you please do this referral? Thanks Brett Canales

## 2019-12-16 ENCOUNTER — Encounter: Payer: Self-pay | Admitting: Family Medicine

## 2019-12-17 NOTE — Telephone Encounter (Signed)
Can you please schedule pt for f/u. My understanding is that he was seen recently for CP, requested cardiology referral but I have not seen him since 08/2018. Thanks, BJ

## 2019-12-18 ENCOUNTER — Encounter: Payer: Self-pay | Admitting: Family Medicine

## 2019-12-18 ENCOUNTER — Telehealth (INDEPENDENT_AMBULATORY_CARE_PROVIDER_SITE_OTHER): Payer: No Typology Code available for payment source | Admitting: Family Medicine

## 2019-12-18 ENCOUNTER — Encounter: Payer: Self-pay | Admitting: *Deleted

## 2019-12-18 VITALS — Ht 74.0 in

## 2019-12-18 DIAGNOSIS — E785 Hyperlipidemia, unspecified: Secondary | ICD-10-CM | POA: Diagnosis not present

## 2019-12-18 DIAGNOSIS — R079 Chest pain, unspecified: Secondary | ICD-10-CM

## 2019-12-18 DIAGNOSIS — I2510 Atherosclerotic heart disease of native coronary artery without angina pectoris: Secondary | ICD-10-CM | POA: Diagnosis not present

## 2019-12-18 DIAGNOSIS — I10 Essential (primary) hypertension: Secondary | ICD-10-CM

## 2019-12-18 NOTE — Telephone Encounter (Signed)
Patient scheduled for virtual visit  

## 2019-12-18 NOTE — Telephone Encounter (Signed)
Left detailed message for patient to return call to office to schedule f/u per Dr. Swaziland.

## 2019-12-18 NOTE — Progress Notes (Signed)
Virtual Visit via Video Note   I connected with Mr Launer on 12/18/19 by a video enabled telemedicine application and verified that I am speaking with the correct person using two identifiers.  Location patient: home Location provider:work office Persons participating in the virtual visit: patient, provider  I discussed the limitations of evaluation and management by telemedicine and the availability of in person appointments. The patient expressed understanding and agreed to proceed.  HPI: Mr Sinkfield is a 49 yo male with history of CAD, hypertension, and hyperlipidemia who is concerned about intermitent episodes of left-sided chest pain. About 2 weeks ago he started with left-sided "pinching" like chest pain, usually happens at rest. It lasts about 10 seconds. Pain is not radiated. He has not identified exacerbating or alleviating factors. No associated palpitation, dyspnea, diaphoresis, or dizziness.  He has not tried OTC medications.  CAD s/p stent placement in 10/2008.  Hypertension: Currently he is on losartan 100 mg and HCTZ 12.5 mg daily. He is checking BP at home regularly, it has been "good." Negative for frequent/severe headache, visual changes, abdominal pain, nausea, vomiting, gross hematuria, decreased urine output, edema, or focal neurologic deficit.  Lab Results  Component Value Date   CREATININE 1.11 09/02/2018   BUN 15 09/02/2018   NA 140 09/02/2018   K 4.1 09/02/2018   CL 103 09/02/2018   CO2 27 09/02/2018   Hyperlipidemia: Currently he is on atorvastatin 40 mg daily. Tolerating medication well.  Lab Results  Component Value Date   CHOL 173 09/02/2018   HDL 37.50 (L) 09/02/2018   LDLCALC 111 (H) 09/02/2018   TRIG 121.0 09/02/2018   CHOLHDL 5 09/02/2018   ROS: See pertinent positives and negatives per HPI.  Past Medical History:  Diagnosis Date  . CAD (coronary artery disease)   . Family history of heart disease   . History of nuclear stress test  06/2011   exercise; negative for ischemia, normal pattern of perfusion   . Hyperlipidemia   . Hypertension     Past Surgical History:  Procedure Laterality Date  . APPENDECTOMY  1977  . CORONARY ANGIOPLASTY WITH STENT PLACEMENT  10/2008   xience 3.5x61mm stent to LAD [facility])  . HERNIA REPAIR  1996  . TRANSTHORACIC ECHOCARDIOGRAM  2010   EF ~65%; borderline LA enlargement     Family History  Problem Relation Age of Onset  . Heart disease Father        CABG at 54  . Heart disease Paternal Grandmother   . Heart disease Paternal Grandfather   . Depression Mother     Social History   Socioeconomic History  . Marital status: Married    Spouse name: Not on file  . Number of children: 3  . Years of education: Not on file  . Highest education level: Not on file  Occupational History  . Not on file  Tobacco Use  . Smoking status: Never Smoker  . Smokeless tobacco: Never Used  Substance and Sexual Activity  . Alcohol use: Yes    Comment: occasional   . Drug use: No  . Sexual activity: Not on file  Other Topics Concern  . Not on file  Social History Narrative  . Not on file   Social Determinants of Health   Financial Resource Strain:   . Difficulty of Paying Living Expenses:   Food Insecurity:   . Worried About Charity fundraiser in the Last Year:   . [place] in the  Last Year:   Transportation Needs:   . Freight forwarder (Medical):   Marland Kitchen Lack of Transportation (Non-Medical):   Physical Activity:   . Days of Exercise per Week:   . Minutes of Exercise per Session:   Stress:   . Feeling of Stress :   Social Connections:   . Frequency of Communication with Friends and Family:   . Frequency of Social Gatherings with Friends and Family:   . Attends Religious Services:   . Active Member of Clubs or Organizations:   . Attends Banker Meetings:   Marland Kitchen Marital Status:   Intimate Partner Violence:   . Fear of Current or Ex-Partner:   .  Emotionally Abused:   Marland Kitchen Physically Abused:   . Sexually Abused:     Current Outpatient Medications:  .  aspirin 81 MG tablet, Take 81 mg by mouth daily., Disp: , Rfl:  .  atorvastatin (LIPITOR) 40 MG tablet, TAKE 1 TABLET(40 MG) BY MOUTH DAILY WITH SUPPER, Disp: 90 tablet, Rfl: 1 .  hydrochlorothiazide (HYDRODIURIL) 12.5 MG tablet, TAKE 1 TABLET(12.5 MG) BY MOUTH DAILY, Disp: 90 tablet, Rfl: 0 .  losartan (COZAAR) 100 MG tablet, TAKE 1 TABLET(100 MG) BY MOUTH DAILY, Disp: 90 tablet, Rfl: 0 .  sertraline (ZOLOFT) 100 MG tablet, TAKE 1 TABLET(100 MG) BY MOUTH DAILY, Disp: 90 tablet, Rfl: 0  EXAM:  VITALS per patient if applicable:Ht 6\' 2"  (1.88 m)   BMI 29.58 kg/m   GENERAL: alert, oriented, appears well and in no acute distress  HEENT: atraumatic, conjunctiva clear, no obvious abnormalities on inspection. NECK: normal movements of the head and neck  LUNGS: on inspection no signs of respiratory distress, breathing rate appears normal, no obvious gross SOB, gasping or wheezing  CV: no obvious cyanosis  MS: moves all visible extremities without noticeable abnormality  PSYCH/NEURO: pleasant and cooperative, no obvious depression or anxiety, speech and thought processing grossly intact  ASSESSMENT AND PLAN:  Discussed the following assessment and plan:  Chest pain, unspecified type - Plan: Ambulatory referral to Cardiology We discussed possible etiologies. ? Musculoskeletal. History does not quite suggest cardiac etiology but given his history of CAD, it is something that needs to be considered regardless. Cardiology referral placed. He was clearly instructed about warning signs.  CAD (coronary artery disease) Continue aspirins 81 mg daily and atorvastatin 40 mg daily. Instructed about warning signs. Cardiology appointment will be arranged.   HTN (hypertension) BP seems to be adequately controlled. Continue losartan 100 mg daily and HCTZ 12.5 mg daily. Continue monitoring  BP regularly and low-salt diet.  Hyperlipidemia Last LDL 111 over a year ago. Goal LDL < 100 , ideally < 70. Continue atorvastatin 40 mg daily.   He has not had labs in over a year. He may have labs during cardiology appt, so will hold on blood work until then, will arrange lab appt if needed.   I discussed the assessment and treatment plan with the patient. Mr Taaffe was provided an opportunity to ask questions and all were answered. He  agreed with the plan and demonstrated an understanding of the instructions.   Return in about 6 months (around 06/19/2020) for CPE.    Iylah Dworkin 06/21/2020, MD

## 2019-12-18 NOTE — Assessment & Plan Note (Signed)
Last LDL 111 over a year ago. Goal LDL < 100 , ideally < 70. Continue atorvastatin 40 mg daily.

## 2019-12-18 NOTE — Assessment & Plan Note (Signed)
Continue aspirins 81 mg daily and atorvastatin 40 mg daily. Instructed about warning signs. Cardiology appointment will be arranged.

## 2019-12-18 NOTE — Telephone Encounter (Signed)
Pt is calling back to make an follow-up appointment with Dr. Swaziland and she does not have anything until January 01, 2020 when should the pt be scheduled.

## 2019-12-18 NOTE — Telephone Encounter (Signed)
Patient scheduled for mychart visit for today.

## 2019-12-18 NOTE — Assessment & Plan Note (Signed)
BP seems to be adequately controlled. Continue losartan 100 mg daily and HCTZ 12.5 mg daily. Continue monitoring BP regularly and low-salt diet.

## 2019-12-19 ENCOUNTER — Telehealth: Payer: Self-pay | Admitting: Family Medicine

## 2019-12-19 NOTE — Telephone Encounter (Signed)
Per Jordan-Pt needs 6 month for CPE (around 06/19/20) LVM to set up appt

## 2019-12-26 NOTE — Telephone Encounter (Signed)
Patient seen by Dr. Swaziland on 12/18/2019 via video visit. Cardiology referral placed. Nothing further needed at this time.

## 2019-12-26 NOTE — Telephone Encounter (Signed)
I would keep that appt. I am sure there is nothing sooner unless there is a cancellation

## 2020-01-09 NOTE — Progress Notes (Signed)
Cardiology Office Note   Date:  01/10/2020   ID:  Francisco White, DOB [birth year], MRN [id number]  PCP:  Martinique, Betty G, MD  Cardiologist:   Dorris Carnes, MD   Pt presents for f/u of CAD     History of Present Illness: Francisco White is a 49 y.o. male with a history of CAD (stent to LAD in 2010), HTN, HL   He returns for follow up   The patient says about 2 weeks  ago he had sharp L sided CP   Episodes occurred at rest , lasted about 10 sec    In 2010 symptoms before stent was  indigestion   Pt does 10000 steps per day   No problems doing that    No outpatient medications have been marked as taking for the 01/10/20 encounter (Office Visit) with Fay Records, MD.     Allergies:   Simvastatin   Past Medical History:  Diagnosis Date   CAD (coronary artery disease)    Depression 03/26/2013   Family history of heart disease    History of nuclear stress test 06/2011   exercise; negative for ischemia, normal pattern of perfusion    HTN (hypertension) 03/26/2013   Hyperlipidemia    Hypertension    Skin sensation disturbance 6/96/7893   Umbilical hernia 03/05/1750    Past Surgical History:  Procedure Laterality Date   APPENDECTOMY  1977   CORONARY ANGIOPLASTY WITH STENT PLACEMENT  10/2008   xience 3.5x77mm stent to LAD ([place])   [place] ECHOCARDIOGRAM  2010   EF ~65%; borderline LA enlargement      Social History:  The patient  reports that he has never smoked. He has never used smokeless tobacco. He reports current alcohol use. He reports that he does not use drugs.   Family History:  The patient's family history includes Depression in his mother; Heart disease in his father, paternal grandfather, and paternal grandmother.    ROS:  Please see the history of present illness. All other systems are reviewed and  Negative to the above problem except as noted.    PHYSICAL EXAM: VS:  BP 128/84    Pulse (!) 55    Ht 6\' 2"   (1.88 m)    Wt 233 lb 9.6 oz (106 kg)    SpO2 96%    BMI 29.99 kg/m   GEN: Well nourished, well developed, in no acute distress  HEENT: normal  Neck: no JVD, carotid bruits, or masses Cardiac: RRR; no murmurs, rubs, or gallops,no LE edema  Respiratory:  clear to auscultation bilaterally, normal work of breathing GI: soft, nontender, nondistended, + BS  No hepatomegaly  MS: no deformity Moving all extremities   Skin: warm and dry, no rash Neuro:  Strength and sensation are intact Psych: euthymic mood, full affect   EKG:  EKG is not ordered today.   Lipid Panel    Component Value Date/Time   CHOL 173 09/02/2018 0759   TRIG 121.0 09/02/2018 0759   HDL 37.50 (L) 09/02/2018 0759   CHOLHDL 5 09/02/2018 0759   VLDL 24.2 09/02/2018 0759   LDLCALC 111 (H) 09/02/2018 0759      Wt Readings from Last 3 Encounters:  01/10/20 233 lb 9.6 oz (106 kg)  12/14/19 230 lb 6.4 oz (104.5 kg)  09/02/18 238 lb (108 kg)      ASSESSMENT AND PLAN:  1  CAD   Recent episodes of CP do  not sound like angina   I would keep on same regimen   2  HTN  BP is controlled  3  HL  Will check lpids   Will check CBC, BMET and lipids      Current medicines are reviewed at length with the patient today.  The patient does not have concerns regarding medicines.  Signed, Dietrich Pates, MD  01/10/2020 1:52 PM    [facility] [address] [place], [place], [place]  [address] Phone: [phone]; Fax: [phone]

## 2020-01-10 ENCOUNTER — Ambulatory Visit (INDEPENDENT_AMBULATORY_CARE_PROVIDER_SITE_OTHER): Payer: No Typology Code available for payment source | Admitting: Internal Medicine

## 2020-01-10 ENCOUNTER — Encounter: Payer: Self-pay | Admitting: Internal Medicine

## 2020-01-10 ENCOUNTER — Other Ambulatory Visit: Payer: Self-pay

## 2020-01-10 VITALS — BP 128/84 | HR 55 | Ht 74.0 in | Wt 233.6 lb

## 2020-01-10 DIAGNOSIS — I2510 Atherosclerotic heart disease of native coronary artery without angina pectoris: Secondary | ICD-10-CM

## 2020-01-10 DIAGNOSIS — I10 Essential (primary) hypertension: Secondary | ICD-10-CM

## 2020-01-10 DIAGNOSIS — E785 Hyperlipidemia, unspecified: Secondary | ICD-10-CM | POA: Diagnosis not present

## 2020-01-10 NOTE — Patient Instructions (Signed)
Medication Instructions:  No changes *If you need a refill on your cardiac medications before your next appointment, please call your pharmacy*   Lab Work: Today: cbc, cmet, lipids  If you have labs (blood work) drawn today and your tests are completely normal, you will receive your results only by: Marland Kitchen MyChart Message (if you have MyChart) OR . A paper copy in the mail If you have any lab test that is abnormal or we need to change your treatment, we will call you to review the results.   Testing/Procedures: none   Follow-Up: At [facility], you and your health needs are our priority.  As part of our continuing mission to provide you with exceptional heart care, we have created designated Provider Care Teams.  These Care Teams include your primary Cardiologist (physician) and Advanced Practice Providers (APPs -  Physician Assistants and Nurse Practitioners) who all work together to provide you with the care you need, when you need it.  Your next appointment:   12 month(s)  The format for your next appointment:   In Person  Provider:   You may see Dietrich Pates, MD or one of the following Advanced Practice Providers on your designated Care Team:    Tereso Newcomer, PA-C  Chelsea Aus, [place]    Other Instructions

## 2020-01-11 LAB — COMPREHENSIVE METABOLIC PANEL
ALT: 32 IU/L (ref 0–44)
AST: 25 IU/L (ref 0–40)
Albumin/Globulin Ratio: 2 (ref 1.2–2.2)
Albumin: 4.8 g/dL (ref 3.8–4.9)
Alkaline Phosphatase: 63 IU/L (ref 48–121)
BUN/Creatinine Ratio: 16 (ref 9–20)
BUN: 16 mg/dL (ref 6–24)
Bilirubin Total: 0.7 mg/dL (ref 0.0–1.2)
CO2: 23 mmol/L (ref 20–29)
Calcium: 9.6 mg/dL (ref 8.7–10.2)
Chloride: 103 mmol/L (ref 96–106)
Creatinine, Ser: 1.02 mg/dL (ref 0.76–1.27)
GFR calc Af Amer: 98 mL/min/{1.73_m2} (ref >59)
GFR calc non Af Amer: 85 mL/min/{1.73_m2} (ref >59)
Globulin, Total: 2.4 g/dL (ref 1.5–4.5)
Glucose: 95 mg/dL (ref 65–99)
Potassium: 4.3 mmol/L (ref 3.5–5.2)
Sodium: 141 mmol/L (ref 134–144)
Total Protein: 7.2 g/dL (ref 6.0–8.5)

## 2020-01-11 LAB — CBC
Hematocrit: 44.9 % (ref 37.5–51.0)
Hemoglobin: 15.4 g/dL (ref 13.0–17.7)
MCH: 30.2 pg (ref 26.6–33.0)
MCHC: 34.3 g/dL (ref 31.5–35.7)
MCV: 88 fL (ref 79–97)
Platelets: 211 10*3/uL (ref 150–450)
RBC: 5.1 x10E6/uL (ref 4.14–5.80)
RDW: 12.9 % (ref 11.6–15.4)
WBC: 9.9 10*3/uL (ref 3.4–10.8)

## 2020-01-11 LAB — LIPID PANEL
Chol/HDL Ratio: 4.2 ratio (ref 0.0–5.0)
Cholesterol, Total: 186 mg/dL (ref 100–199)
HDL: 44 mg/dL (ref >39)
LDL Chol Calc (NIH): 101 mg/dL — ABNORMAL HIGH (ref 0–99)
Triglycerides: 243 mg/dL — ABNORMAL HIGH (ref 0–149)
VLDL Cholesterol Cal: 41 mg/dL — ABNORMAL HIGH (ref 5–40)

## 2020-02-02 ENCOUNTER — Telehealth: Payer: Self-pay | Admitting: *Deleted

## 2020-02-02 DIAGNOSIS — E785 Hyperlipidemia, unspecified: Secondary | ICD-10-CM

## 2020-02-02 MED ORDER — ATORVASTATIN CALCIUM 80 MG PO TABS
80.0000 mg | ORAL_TABLET | Freq: Every day | ORAL | 3 refills | Status: DC
Start: 2020-02-02 — End: 2020-04-16

## 2020-02-02 NOTE — Telephone Encounter (Signed)
I spoke with patient and reviewed lab results and recommendations from Dr Tenny Craw with him.  Will send prescription for increased dose of Atorvastatin to [facility] on [address] and [employer]  He will come in on 04/04/20 for fasting lab work

## 2020-02-02 NOTE — Telephone Encounter (Signed)
Left message to call office

## 2020-02-02 NOTE — Telephone Encounter (Signed)
-----   Message from Pricilla Riffle, MD sent at 01/11/2020 10:52 PM EDT ----- CBC is normal Electrolytes, kidney function, liver function are normal  LIpids :    LDL 101   With CAD history needs to be lower  (70 or below)   WOuld increase lipitor to 80   Make sure taking in evening    Check lpids in 2 months    Triglycerides elevated 243   May be effected by food  Was normal 1 year ago

## 2020-02-13 ENCOUNTER — Other Ambulatory Visit: Payer: Self-pay | Admitting: Family Medicine

## 2020-02-13 DIAGNOSIS — E785 Hyperlipidemia, unspecified: Secondary | ICD-10-CM

## 2020-03-11 ENCOUNTER — Other Ambulatory Visit: Payer: Self-pay | Admitting: Family Medicine

## 2020-03-11 DIAGNOSIS — F329 Major depressive disorder, single episode, unspecified: Secondary | ICD-10-CM

## 2020-04-04 ENCOUNTER — Other Ambulatory Visit: Payer: Self-pay

## 2020-04-04 ENCOUNTER — Other Ambulatory Visit: Payer: No Typology Code available for payment source | Admitting: *Deleted

## 2020-04-04 DIAGNOSIS — E785 Hyperlipidemia, unspecified: Secondary | ICD-10-CM

## 2020-04-04 LAB — LIPID PANEL
Chol/HDL Ratio: 3.7 ratio (ref 0.0–5.0)
Cholesterol, Total: 167 mg/dL (ref 100–199)
HDL: 45 mg/dL (ref >39)
LDL Chol Calc (NIH): 108 mg/dL — ABNORMAL HIGH (ref 0–99)
Triglycerides: 72 mg/dL (ref 0–149)
VLDL Cholesterol Cal: 14 mg/dL (ref 5–40)

## 2020-04-08 ENCOUNTER — Telehealth: Payer: Self-pay | Admitting: *Deleted

## 2020-04-08 NOTE — Telephone Encounter (Signed)
Please schedule patient in clinic so we can discuss cost/options. thanks

## 2020-04-08 NOTE — Telephone Encounter (Signed)
I spoke with patient and reviewed results with him. He is taking Atorvastatin 80 mg daily.  He reports increased pain in right knee since being on increased dose.  Also has difficult swallowing due to size of pill and is asking if next refill could be for 2 of the 40 mg tablets. He is concerned about cost of Repatha due to high deductible but is willing to discuss with pharmacist. Will forward to lipid clinic regarding possible Repatha initiation

## 2020-04-08 NOTE — Telephone Encounter (Signed)
I called patient to schedule appointment. He would like to wait on scheduling appointment. He would like to work on diet and increasing exercise and recheck lab work in 2 months if OK with Dr Tenny Craw

## 2020-04-08 NOTE — Telephone Encounter (Signed)
-----   Message from Dietrich Pates V, MD sent at 04/05/2020  9:03 PM EDT ----- Lipids are still not optimized LDL should be 70 or below  Confirm taking lipitor  If he is then it is not adequate and I would recomm Repatha Then I would forward to pharmacy for initiation

## 2020-04-09 NOTE — Telephone Encounter (Signed)
LDL is 108, goal is < 70. Unlikely that diet and exercise will lower LDL an extra ~40 pts. He has Nurse, learning disability so we can use a copay card to make PCSK9i or Nexlizet affordable. Would still recommend that pt come in for lipid visit to discuss options. If he still prefers to work on diet and exercise, would schedule him an appt in lipid clinic in 2 months after labs are checked to reassess readiness at that time.

## 2020-04-11 MED ORDER — REPATHA SURECLICK 140 MG/ML ~~LOC~~ SOAJ: 1.0000 "pen " | SUBCUTANEOUS | 11 refills | Status: DC

## 2020-04-11 NOTE — Telephone Encounter (Signed)
Confirmed $5 copay for Repatha at pharmacy. Called pt who is still a bit hesitant that the copay is only $5 because of his specific plan. Advised him that his insurance already approved the Repatha and that the pharmacy confirmed they are filling it for $5. He will pick up rx and bring to PharmD appt next Tuesday. Will discuss Repatha in more detail, help pt with injection, and schedule follow up labs at that time.

## 2020-04-11 NOTE — Telephone Encounter (Signed)
I don't know any specifics about this particular plan, typically with any commercial plan we can use a copay card once prior authorization is completed.   Prior auth submitted and approved through 04/11/21. Copay card activated and called to pharmacy 20 mins later who states rx has still not come through. Will call back later to confirm copay, then will reach out to pt.

## 2020-04-11 NOTE — Telephone Encounter (Signed)
Reviewed w patient. He has a "grandfathered in, pre affordable care act" insurance that includes a ryder that excludes getting cholesterol help.  Has had it for over 12 years and cannot give it up.  He has an $8000 deductible on this policy.  He would be interested to hear if this can be worked on as if there is no insurance option.  Would there be any patient assistance for this in that case?  He pays out of pocket for all cholesterol related medications.

## 2020-04-16 ENCOUNTER — Ambulatory Visit (INDEPENDENT_AMBULATORY_CARE_PROVIDER_SITE_OTHER): Payer: No Typology Code available for payment source | Admitting: Pharmacist

## 2020-04-16 ENCOUNTER — Other Ambulatory Visit: Payer: Self-pay

## 2020-04-16 DIAGNOSIS — E785 Hyperlipidemia, unspecified: Secondary | ICD-10-CM

## 2020-04-16 MED ORDER — ATORVASTATIN CALCIUM 40 MG PO TABS
80.0000 mg | ORAL_TABLET | Freq: Every day | ORAL | 11 refills | Status: DC
Start: 2020-04-16 — End: 2020-04-17

## 2020-04-16 NOTE — Patient Instructions (Addendum)
Your next Repatha injection is due on October 5th. Inject 1 shot every 2 weeks into the fatty tissue in your abdomen  Recheck fasting cholesterol labs on Monday, October 25th any time after 7:30am  Call Karin Pinedo with any issues #[phone]. We can decrease your atorvastatin back to 40mg  or change you to rosuvastatin 20-40mg  if your aches become bothersome

## 2020-04-16 NOTE — Progress Notes (Addendum)
Patient ID: Francisco White                 DOB: April 11, 1971                    MRN: [id number]     HPI: Francisco White is a 49 y.o. male patient referred to lipid clinic by Dr Tenny Craw. PMH is significant for CAD s/p stent to LAD in 2010, HTN, HLD, and family history of CAD. LDL remains above goal on high intensity statin therapy so pt was referred to lipid clinic for PCSK9i therapy.  Pt presents today in good spirits. He picked up Repatha from his pharmacy without issue. Did notice some aches in his knees when he increased his atorvastatin dose from 40mg  to 80mg .   Current Medications: atorvastatin 80mg  daily Intolerances: simvastatin - myalgias Risk Factors: CAD s/p DES, fam hx of CAD LDL goal: 70mg /dL  Family History: The patient's family history includes Depression in his mother; Heart disease in his father, paternal grandfather, and paternal grandmother.   Social History: The patient  reports that he has never smoked. He has never used smokeless tobacco. He reports current alcohol use. He reports that he does not use drugs.   Labs: 04/04/20: TC 167, TG 72, HDL 45, LDL 108 (atorvastatin 80mg  daily) 01/10/20: TC 186, TG 243, HDL 44, LDL 101 (atorvastatin 40mg  daily)  Past Medical History:  Diagnosis Date   CAD (coronary artery disease)    Depression 03/26/2013   Family history of heart disease    History of nuclear stress test 06/2011   exercise; negative for ischemia, normal pattern of perfusion    HTN (hypertension) 03/26/2013   Hyperlipidemia    Hypertension    Skin sensation disturbance 04/07/2017   Umbilical hernia 03/26/2013    Current Outpatient Medications on File Prior to Visit  Medication Sig Dispense Refill   aspirin 81 MG tablet Take 81 mg by mouth daily.     atorvastatin (LIPITOR) 80 MG tablet Take 1 tablet (80 mg total) by mouth daily. 90 tablet 3   Evolocumab (REPATHA SURECLICK) 140 MG/ML SOAJ Inject 1 pen into the skin every 14 (fourteen) days. 2 mL 11    hydrochlorothiazide (HYDRODIURIL) 12.5 MG tablet TAKE 1 TABLET(12.5 MG) BY MOUTH DAILY 90 tablet 1   losartan (COZAAR) 100 MG tablet TAKE 1 TABLET(100 MG) BY MOUTH DAILY 90 tablet 1   sertraline (ZOLOFT) 100 MG tablet TAKE 1 TABLET(100 MG) BY MOUTH DAILY 90 tablet 1   No current facility-administered medications on file prior to visit.    Allergies  Allergen Reactions   Simvastatin Other (See Comments)    Myalgias     Assessment/Plan:  1. Hyperlipidemia - LDL 108 on atorvastatin 80mg  daily, above goal < 70 due to history of ASCVD. Pt successfully administered first Repatha injection in clinic today. Scheduled follow up labs in 6 weeks to assess efficacy. Discussed decreasing atorvastatin dose back to 40mg  or changing to rosuvastatin to help with tolerability, however pt prefers to continue on 80mg  daily for now until lab work is rechecked. Sent in refill to take 2 of the 40mg  tabs each day per pt preference due to smaller tablet size. Advised pt to call clinic if he does wish to change his statin prior to follow up labs.  Zaylee Cornia E. Rowena Moilanen, PharmD, BCACP, CPP [facility] [address], [place], 03/28/2013 06/07/2017 Phone: [phone]; Fax: [phone] 04/16/2020 1:37 PM

## 2020-04-17 ENCOUNTER — Other Ambulatory Visit: Payer: Self-pay

## 2020-04-17 ENCOUNTER — Telehealth: Payer: Self-pay

## 2020-04-17 MED ORDER — ATORVASTATIN CALCIUM 80 MG PO TABS
80.0000 mg | ORAL_TABLET | Freq: Every day | ORAL | 6 refills | Status: DC
Start: 2020-04-17 — End: 2020-04-30

## 2020-04-17 NOTE — Telephone Encounter (Signed)
**Note De-Identified Francisco White Obfuscation** We received a request to do a PA on the pts Atorvastatin. I checked the pts med list and his Atorvastatin was sent in to fill as 40 mg tablets with instructions to take 2 tablets daily. Insurance will not cover a medications at a lower or higher dose if there is the correct dose available. I have changed RX to Atorvastatin 80 mg with instruction to take 1 tablet daily and e-scribed to [facility] pharmacy #30/6 refills. I also called the pt and made him aware to only take 1 tablet (not 2) of his Atorvastatin 80 mg daily. He verbalized understanding and thanked me for calling to let him know.

## 2020-04-30 ENCOUNTER — Other Ambulatory Visit: Payer: Self-pay

## 2020-04-30 DIAGNOSIS — F329 Major depressive disorder, single episode, unspecified: Secondary | ICD-10-CM

## 2020-04-30 MED ORDER — LOSARTAN POTASSIUM 100 MG PO TABS: ORAL_TABLET | ORAL | 1 refills | Status: DC

## 2020-04-30 MED ORDER — SERTRALINE HCL 100 MG PO TABS: ORAL_TABLET | ORAL | 1 refills | Status: DC

## 2020-04-30 MED ORDER — HYDROCHLOROTHIAZIDE 12.5 MG PO TABS: ORAL_TABLET | ORAL | 1 refills | Status: DC

## 2020-04-30 MED ORDER — ATORVASTATIN CALCIUM 80 MG PO TABS
80.0000 mg | ORAL_TABLET | Freq: Every day | ORAL | 6 refills | Status: DC
Start: 2020-04-30 — End: 2020-05-22

## 2020-05-20 ENCOUNTER — Other Ambulatory Visit: Payer: No Typology Code available for payment source

## 2020-05-20 ENCOUNTER — Other Ambulatory Visit: Payer: Self-pay

## 2020-05-20 DIAGNOSIS — E785 Hyperlipidemia, unspecified: Secondary | ICD-10-CM

## 2020-05-20 LAB — HEPATIC FUNCTION PANEL
ALT: 30 IU/L (ref 0–44)
AST: 24 IU/L (ref 0–40)
Albumin: 4.7 g/dL (ref 3.8–4.9)
Alkaline Phosphatase: 66 IU/L (ref 44–121)
Bilirubin Total: 0.8 mg/dL (ref 0.0–1.2)
Bilirubin, Direct: 0.28 mg/dL (ref 0.00–0.40)
Total Protein: 6.9 g/dL (ref 6.0–8.5)

## 2020-05-20 LAB — LIPID PANEL
Chol/HDL Ratio: 1.8 ratio (ref 0.0–5.0)
Cholesterol, Total: 85 mg/dL — ABNORMAL LOW (ref 100–199)
HDL: 47 mg/dL (ref >39)
LDL Chol Calc (NIH): 22 mg/dL (ref 0–99)
Triglycerides: 77 mg/dL (ref 0–149)
VLDL Cholesterol Cal: 16 mg/dL (ref 5–40)

## 2020-05-22 ENCOUNTER — Telehealth: Payer: Self-pay | Admitting: *Deleted

## 2020-05-22 MED ORDER — ATORVASTATIN CALCIUM 40 MG PO TABS: 40.0000 mg | ORAL_TABLET | Freq: Every day | ORAL | Status: DC

## 2020-05-22 NOTE — Telephone Encounter (Signed)
Pt has been notified of lab results and recommendations. Pt agreeable to continue on Repatha and decrease Lipitor to 40 mg daily. Pt said he will cut the remaining Lipitor 80 mg tablets in 1/2 to = 40 mg tablet. Pt will call in for a new Rx for the Lipitor 40 mg when he is due for a refill. Pt thanked me for the call. Med list has been updated. Patient notified of result.  Please refer to phone note from today for complete details.   Danielle Rankin, [facility] 05/22/2020 4:22 PM

## 2020-06-19 ENCOUNTER — Encounter: Payer: No Typology Code available for payment source | Admitting: Family Medicine

## 2020-09-03 ENCOUNTER — Telehealth: Payer: Self-pay

## 2020-09-03 MED ORDER — PRALUENT 150 MG/ML ~~LOC~~ SOAJ: 150.0000 mg | SUBCUTANEOUS | 11 refills | Status: DC

## 2020-09-03 NOTE — Telephone Encounter (Signed)
Called and lmomed pt stated that they were approved for praluent 150 instead of repatha due to insurance change, rx sent, nstructd apt to apply for copay card or to call us if they have issues

## 2020-09-04 ENCOUNTER — Telehealth: Payer: Self-pay | Admitting: Internal Medicine

## 2020-09-04 NOTE — Telephone Encounter (Signed)
New Message:     Renae Fickle from [facility] will be faxing a form for pt's prior Authorization for his Repatha. If you want to call and do this by phone, please call [phone].

## 2020-09-04 NOTE — Telephone Encounter (Signed)
Larita Fife you can disregard this. Pt was changed from Repatha to Praluent yesterday per formulary change.

## 2020-09-10 ENCOUNTER — Other Ambulatory Visit: Payer: Self-pay

## 2020-09-11 ENCOUNTER — Encounter: Payer: Self-pay | Admitting: Family Medicine

## 2020-09-11 ENCOUNTER — Ambulatory Visit (INDEPENDENT_AMBULATORY_CARE_PROVIDER_SITE_OTHER): Payer: No Typology Code available for payment source | Admitting: Family Medicine

## 2020-09-11 VITALS — BP 118/70 | HR 72 | Resp 12 | Ht 74.0 in | Wt 242.0 lb

## 2020-09-11 DIAGNOSIS — Z130 Encounter for screening for diseases of the blood and blood-forming organs and certain disorders involving the immune mechanism: Secondary | ICD-10-CM

## 2020-09-11 DIAGNOSIS — Z0000 Encounter for general adult medical examination without abnormal findings: Secondary | ICD-10-CM | POA: Diagnosis not present

## 2020-09-11 DIAGNOSIS — M791 Myalgia, unspecified site: Secondary | ICD-10-CM | POA: Diagnosis not present

## 2020-09-11 DIAGNOSIS — E785 Hyperlipidemia, unspecified: Secondary | ICD-10-CM

## 2020-09-11 DIAGNOSIS — F325 Major depressive disorder, single episode, in full remission: Secondary | ICD-10-CM

## 2020-09-11 DIAGNOSIS — E538 Deficiency of other specified B group vitamins: Secondary | ICD-10-CM | POA: Diagnosis not present

## 2020-09-11 DIAGNOSIS — Z13228 Encounter for screening for other metabolic disorders: Secondary | ICD-10-CM | POA: Diagnosis not present

## 2020-09-11 DIAGNOSIS — I10 Essential (primary) hypertension: Secondary | ICD-10-CM | POA: Diagnosis not present

## 2020-09-11 DIAGNOSIS — Z1211 Encounter for screening for malignant neoplasm of colon: Secondary | ICD-10-CM

## 2020-09-11 DIAGNOSIS — Z1329 Encounter for screening for other suspected endocrine disorder: Secondary | ICD-10-CM | POA: Diagnosis not present

## 2020-09-11 DIAGNOSIS — Z1159 Encounter for screening for other viral diseases: Secondary | ICD-10-CM

## 2020-09-11 LAB — BASIC METABOLIC PANEL
BUN: 19 mg/dL (ref 6–23)
CO2: 29 mEq/L (ref 19–32)
Calcium: 9.5 mg/dL (ref 8.4–10.5)
Chloride: 103 mEq/L (ref 96–112)
Creatinine, Ser: 1.11 mg/dL (ref 0.40–1.50)
GFR: 76.59 mL/min (ref >60.00)
Glucose, Bld: 99 mg/dL (ref 70–99)
Potassium: 4.1 mEq/L (ref 3.5–5.1)
Sodium: 139 mEq/L (ref 135–145)

## 2020-09-11 LAB — TSH: TSH: 2.06 u[IU]/mL (ref 0.35–4.50)

## 2020-09-11 LAB — HEPATITIS C ANTIBODY
Hepatitis C Ab: NONREACTIVE
SIGNAL TO CUT-OFF: 0.01 (ref <1.00)

## 2020-09-11 LAB — VITAMIN B12: Vitamin B-12: 254 pg/mL (ref 211–911)

## 2020-09-11 LAB — HEMOGLOBIN A1C: Hgb A1c MFr Bld: 5.5 % (ref 4.6–6.5)

## 2020-09-11 MED ORDER — SERTRALINE HCL 100 MG PO TABS
ORAL_TABLET | ORAL | 3 refills | Status: DC
Start: 2020-09-11 — End: 2020-10-21

## 2020-09-11 MED ORDER — HYDROCHLOROTHIAZIDE 12.5 MG PO TABS
ORAL_TABLET | ORAL | 3 refills | Status: DC
Start: 2020-09-11 — End: 2020-10-18

## 2020-09-11 MED ORDER — LOSARTAN POTASSIUM 100 MG PO TABS: ORAL_TABLET | ORAL | 3 refills | Status: DC

## 2020-09-11 NOTE — Assessment & Plan Note (Signed)
Further recommendations according to B12 result. 

## 2020-09-11 NOTE — Assessment & Plan Note (Signed)
LDL is at goal. Continue praluent and Atorvastatin + low fat diet. Following with cardiologist.

## 2020-09-11 NOTE — Assessment & Plan Note (Signed)
Not symptomatic. Continue Sertraline 100 mg daily.

## 2020-09-11 NOTE — Assessment & Plan Note (Signed)
BP adequately controlled. No changes in Losartan or HCTZ dose. Low sat diet. Continue monitoring BP regularly.

## 2020-09-11 NOTE — Patient Instructions (Addendum)
A few things to remember from today's visit:   Routine general medical examination at a health care facility  Primary hypertension  Hyperlipidemia, unspecified hyperlipidemia type  B12 deficiency - Plan: Vitamin B12  Encounter for HCV screening test for low risk patient - Plan: Hepatitis C antibody screen  Myalgia - Plan: TSH  Colon cancer screening  Screening for endocrine, metabolic and immunity disorder - Plan: Basic metabolic panel, Hemoglobin A1c  Major depressive disorder with single episode, remission status unspecified - Plan: sertraline (ZOLOFT) 100 MG tablet  Essential hypertension  If you need refills please call your pharmacy. Do not use My Chart to request refills or for acute issues that need immediate attention.    At least 150 minutes of moderate exercise per week, daily brisk walking for 15-30 min is a good exercise option. Healthy diet low in saturated (animal) fats and sweets and consisting of fresh fruits and vegetables, lean meats such as fish and white chicken and whole grains.  - Vaccines:  Tdap vaccine every 10 years.  Shingles vaccine recommended at age 79, could be given after 52 years of age but not sure about insurance coverage.  Pneumonia vaccines: Pneumovax at 88   -Screening recommendations for low/normal risk males:  Screening for diabetes at age 36 and every 3 years. Earlier screening if cardiovascular risk factors.   Lipid screening at 35 and every 3 years. Screening starts in younger males with cardiovascular risk factors.N/A  Colon cancer screening is now at age 65 but your insurance may not cover until age 22 .screening is recommended age 71.  Prostate cancer screening: some controversy, starts usually at 50: Rectal exam and PSA.  Aortic Abdominal Aneurism once between 74 and 94 years old if ever smoker.  Also recommended:  1. Dental visit- Brush and floss your teeth twice daily; visit your dentist twice a year. 2. Eye  doctor- Get an eye exam at least every 2 years. 3. Helmet use- Always wear a helmet when riding a bicycle, motorcycle, rollerblading or skateboarding. 4. Safe sex- If you may be exposed to sexually transmitted infections, use a condom. 5. Seat belts- Seat belts can save your live; always wear one. 6. Smoke/Carbon Monoxide detectors- These detectors need to be installed on the appropriate level of your home. Replace batteries at least once a year. 7. Skin cancer- When out in the sun please cover up and use sunscreen 15 SPF or higher. 8. Violence- If anyone is threatening or hurting you, please tell your healthcare provider.  9. Drink alcohol in moderation- Limit alcohol intake to one drink or less per day. Never drink and drive.  Please be sure medication list is accurate. If a new problem present, please set up appointment sooner than planned today.

## 2020-09-11 NOTE — Progress Notes (Signed)
HPI: [name] is a 50 y.o.male here today for his routine physical examination.  Last CPE: 07/10/16. He lives with his wife and 2 daughter 86 yo and 53.  Regular exercise 3 or more times per week: 10,000 steps daily. Following a healthy diet: Yes.  Chronic medical problems: Nephrolithiasis,HLD,CAD. He takes Sertraline to decrease libido. Remote hx of depression, denied symptoms.  Immunization History  Administered Date(s) Administered  . Influenza,inj,Quad PF,6+ Mos 04/07/2017, 07/18/2018  . Influenza-Unspecified 04/18/2016  . Tdap 07/10/2016   -Hep C screening: Never.  Last colon cancer screening: Never. Last prostate ca screening: PSA 6 months ago at his urologist's office.  Negative for high alcohol intake, tobacco use, or Hx of illicit drug use.  -Concerns and/or follow up today:   B12 deficiency: He is not on B12 supplementation. Lab Results  Component Value Date   VITAMINB12 683 09/02/2018   HTN: He is on Losartan 100 mg daily and HCTZ 12.5 mg daily. Checks BP at home regularly, wrist monitor. CAD and HLD: Following with cardiologist annually. He is on Atorvastatin 40 mg daily and Praluent [place] q 14 days. LDL has greatly improved. Noted muscles aches/cramps since Praluent was started, alleviated by ROM exercises. Myalgias are mild and do not interfere with ADL's.  Lab Results  Component Value Date   CHOL 85 (L) 05/20/2020   HDL 47 05/20/2020   LDLCALC 22 05/20/2020   TRIG 77 05/20/2020   CHOLHDL 1.8 05/20/2020   Review of Systems  Constitutional: Negative for activity change, appetite change, fatigue and fever.  HENT: Negative for dental problem, nosebleeds, sore throat, trouble swallowing and voice change.   Eyes: Negative for redness and visual disturbance.  Respiratory: Negative for cough, shortness of breath and wheezing.   Cardiovascular: Negative for chest pain, palpitations and leg swelling.  Gastrointestinal: Negative for abdominal  pain, blood in stool, nausea and vomiting.  Endocrine: Negative for cold intolerance, heat intolerance, polydipsia, polyphagia and polyuria.  Genitourinary: Negative for decreased urine volume, dysuria, genital sores, hematuria and testicular pain.  Musculoskeletal: Negative for gait problem and joint swelling.  Skin: Negative for color change and rash.  Allergic/Immunologic: Negative for environmental allergies.  Neurological: Negative for syncope, weakness and headaches.  Hematological: Negative for adenopathy. Does not bruise/bleed easily.  Psychiatric/Behavioral: Negative for confusion. The patient is not nervous/anxious.   All other systems reviewed and are negative.  Current Outpatient Medications on File Prior to Visit  Medication Sig Dispense Refill  . Alirocumab (PRALUENT) 150 MG/ML SOAJ Inject 150 mg into the skin every 14 (fourteen) days. 2 mL 11  . aspirin 81 MG tablet Take 81 mg by mouth daily.    Marland Kitchen atorvastatin (LIPITOR) 40 MG tablet Take 1 tablet (40 mg total) by mouth daily.     No current facility-administered medications on file prior to visit.   Past Medical History:  Diagnosis Date  . CAD (coronary artery disease)   . Depression 03/26/2013  . Family history of heart disease   . History of nuclear stress test 06/2011   exercise; negative for ischemia, normal pattern of perfusion   . HTN (hypertension) 03/26/2013  . Hyperlipidemia   . Hypertension   . Skin sensation disturbance 04/07/2017  . Umbilical hernia 03/26/2013    Past Surgical History:  Procedure Laterality Date  . APPENDECTOMY  1977  . CORONARY ANGIOPLASTY WITH STENT PLACEMENT  10/2008   xience 3.5x88mm stent to LAD [facility] [place])  . HERNIA REPAIR  1996  . TRANSTHORACIC  ECHOCARDIOGRAM  2010   EF ~65%; borderline LA enlargement     Allergies  Allergen Reactions  . Simvastatin Other (See Comments)    Myalgias     Family History  Problem Relation Age of Onset  . Heart disease Father         CABG at 1  . Heart disease Paternal Grandmother   . Heart disease Paternal Grandfather   . Depression Mother     Social History   Socioeconomic History  . Marital status: Married    Spouse name: Not on file  . Number of children: 3  . Years of education: Not on file  . Highest education level: Not on file  Occupational History  . Not on file  Tobacco Use  . Smoking status: Never Smoker  . Smokeless tobacco: Never Used  Substance and Sexual Activity  . Alcohol use: Yes    Comment: occasional   . Drug use: No  . Sexual activity: Not on file  Other Topics Concern  . Not on file  Social History Narrative  . Not on file   Social Determinants of Health   Financial Resource Strain: Not on file  Food Insecurity: Not on file  Transportation Needs: Not on file  Physical Activity: Not on file  Stress: Not on file  Social Connections: Not on file   Vitals:   09/11/20 0700  BP: 118/70  Pulse: 72  Resp: 12  SpO2: 98%   Body mass index is 31.07 kg/m.  Wt Readings from Last 3 Encounters:  09/11/20 242 lb (109.8 kg)  01/10/20 233 lb 9.6 oz (106 kg)  12/14/19 230 lb 6.4 oz (104.5 kg)   Physical Exam Vitals and nursing note reviewed.  Constitutional:      General: He is not in acute distress.    Appearance: He is well-developed.  HENT:     Head: Normocephalic and atraumatic.     Right Ear: Tympanic membrane, ear canal and external ear normal.     Left Ear: Tympanic membrane, ear canal and external ear normal.     Mouth/Throat:     Mouth: Oropharynx is clear and moist and mucous membranes are normal. Mucous membranes are moist.     Pharynx: Oropharynx is clear.  Eyes:     Extraocular Movements: Extraocular movements intact and EOM normal.     Conjunctiva/sclera: Conjunctivae normal.     Pupils: Pupils are equal, round, and reactive to light.  Neck:     Thyroid: No thyromegaly.     Trachea: No tracheal deviation.  Cardiovascular:     Rate and Rhythm: Normal rate  and regular rhythm.     Pulses:          Dorsalis pedis pulses are 2+ on the right side and 2+ on the left side.     Heart sounds: No murmur heard.   Pulmonary:     Effort: Pulmonary effort is normal. No respiratory distress.     Breath sounds: Normal breath sounds.  Chest:  Breasts:     Right: No supraclavicular adenopathy.     Left: No supraclavicular adenopathy.    Abdominal:     Palpations: Abdomen is soft. There is no hepatomegaly or mass.     Tenderness: There is no abdominal tenderness.  Genitourinary:    Comments: Deferred to urologist. Musculoskeletal:        General: No tenderness or edema.     Cervical back: Normal range of motion.     Comments:  No major deformities appreciated and no signs of synovitis.  Lymphadenopathy:     Cervical: No cervical adenopathy.     Upper Body:     Right upper body: No supraclavicular adenopathy.     Left upper body: No supraclavicular adenopathy.  Skin:    General: Skin is warm.     Findings: No erythema.  Neurological:     General: No focal deficit present.     Mental Status: He is alert and oriented to person, place, and time.     Cranial Nerves: No cranial nerve deficit.     Sensory: No sensory deficit.     Coordination: Coordination normal.     Gait: Gait normal.     Deep Tendon Reflexes: Strength normal.     Reflex Scores:      Bicep reflexes are 2+ on the right side and 2+ on the left side.      Patellar reflexes are 2+ on the right side and 2+ on the left side. Psychiatric:        Mood and Affect: Mood and affect normal.        Cognition and Memory: Cognition and memory normal.    ASSESSMENT AND PLAN:  Mr.Ernan was seen today for annual exam.  Diagnoses and all orders for this visit:  Lab Results  Component Value Date   HGBA1C 5.5 09/11/2020   Lab Results  Component Value Date   CREATININE 1.11 09/11/2020   BUN 19 09/11/2020   NA 139 09/11/2020   K 4.1 09/11/2020   CL 103 09/11/2020   CO2 29 09/11/2020    Lab Results  Component Value Date   VITAMINB12 254 09/11/2020   Lab Results  Component Value Date   TSH 2.06 09/11/2020   Routine general medical examination at a health care facility He understands the importance of regular physical activity and healthy diet for prevention of chronic illness and/or complications. Preventive guidelines reviewed. Vaccination up to date.  Next CPE in a year.  Encounter for HCV screening test for low risk patient -     Hepatitis C antibody screen  Myalgia Problem seems to be mild. Stretching exercise help ,so continue. Monitor for changes.  Colon cancer screening We discussed options, he would like FIT. FIT cards given today.  Screening for endocrine, metabolic and immunity disorder -     Hemoglobin A1c -     Basic metabolic panel  HTN (hypertension) BP adequately controlled. No changes in Losartan or HCTZ dose. Low sat diet. Continue monitoring BP regularly.  B12 deficiency Further recommendations according to B12 result.  Hyperlipidemia LDL is at goal. Continue praluent and Atorvastatin + low fat diet. Following with cardiologist.  Depression, major, in remission [facility]) Not symptomatic. Continue Sertraline 100 mg daily.  Return in 1 year (on 09/11/2021) for cpe.   Betty G. [place], MD  [facility]. [place] office.  A few things to remember from today's visit:   Routine general medical examination at a health care facility  Primary hypertension  Hyperlipidemia, unspecified hyperlipidemia type  B12 deficiency - Plan: Vitamin B12  Encounter for HCV screening test for low risk patient - Plan: Hepatitis C antibody screen  Myalgia - Plan: TSH  Colon cancer screening  Screening for endocrine, metabolic and immunity disorder - Plan: Basic metabolic panel, Hemoglobin A1c  Major depressive disorder with single episode, remission status unspecified - Plan: sertraline (ZOLOFT) 100 MG tablet  Essential  hypertension  If you need refills please call your pharmacy.  Do not use My Chart to request refills or for acute issues that need immediate attention.    At least 150 minutes of moderate exercise per week, daily brisk walking for 15-30 min is a good exercise option. Healthy diet low in saturated (animal) fats and sweets and consisting of fresh fruits and vegetables, lean meats such as fish and white chicken and whole grains.  - Vaccines:  Tdap vaccine every 10 years.  Shingles vaccine recommended at age 52, could be given after 52 years of age but not sure about insurance coverage.  Pneumonia vaccines: Pneumovax at 8365  -Screening recommendations for low/normal risk males:  Screening for diabetes at age 52 and every 3 years. Earlier screening if cardiovascular risk factors.   Lipid screening at 35 and every 3 years. Screening starts in younger males with cardiovascular risk factors.N/A  Colon cancer screening is now at age 8945 but your insurance may not cover until age 52 .screening is recommended age 52.  Prostate cancer screening: some controversy, starts usually at 50: Rectal exam and PSA.  Aortic Abdominal Aneurism once between 3165 and 52 years old if ever smoker.  Also recommended:  1. Dental visit- Brush and floss your teeth twice daily; visit your dentist twice a year. 2. Eye doctor- Get an eye exam at least every 2 years. 3. Helmet use- Always wear a helmet when riding a bicycle, motorcycle, rollerblading or skateboarding. 4. Safe sex- If you may be exposed to sexually transmitted infections, use a condom. 5. Seat belts- Seat belts can save your live; always wear one. 6. Smoke/Carbon Monoxide detectors- These detectors need to be installed on the appropriate level of your home. Replace batteries at least once a year. 7. Skin cancer- When out in the sun please cover up and use sunscreen 15 SPF or higher. 8. Violence- If anyone is threatening or hurting you, please tell your  healthcare provider.  9. Drink alcohol in moderation- Limit alcohol intake to one drink or less per day. Never drink and drive.  Please be sure medication list is accurate. If a new problem present, please set up appointment sooner than planned today.

## 2020-10-18 ENCOUNTER — Other Ambulatory Visit: Payer: Self-pay | Admitting: Family Medicine

## 2020-10-18 DIAGNOSIS — I10 Essential (primary) hypertension: Secondary | ICD-10-CM

## 2020-10-18 DIAGNOSIS — F325 Major depressive disorder, single episode, in full remission: Secondary | ICD-10-CM

## 2020-11-28 ENCOUNTER — Other Ambulatory Visit: Payer: Self-pay

## 2020-11-28 MED ORDER — OLMESARTAN MEDOXOMIL 20 MG PO TABS: 20.0000 mg | ORAL_TABLET | Freq: Every day | ORAL | 0 refills | Status: DC

## 2020-12-19 DIAGNOSIS — E785 Hyperlipidemia, unspecified: Secondary | ICD-10-CM

## 2020-12-20 NOTE — Telephone Encounter (Signed)
Patient on Praluent and Lipitor. Will check with PharmD before placing lab orders.

## 2020-12-24 ENCOUNTER — Other Ambulatory Visit: Payer: Self-pay

## 2020-12-24 ENCOUNTER — Other Ambulatory Visit: Payer: No Typology Code available for payment source | Admitting: *Deleted

## 2020-12-24 DIAGNOSIS — E785 Hyperlipidemia, unspecified: Secondary | ICD-10-CM

## 2020-12-24 LAB — LIPID PANEL
Chol/HDL Ratio: 6.5 ratio — ABNORMAL HIGH (ref 0.0–5.0)
Cholesterol, Total: 271 mg/dL — ABNORMAL HIGH (ref 100–199)
HDL: 42 mg/dL (ref >39)
LDL Chol Calc (NIH): 204 mg/dL — ABNORMAL HIGH (ref 0–99)
Triglycerides: 137 mg/dL (ref 0–149)
VLDL Cholesterol Cal: 25 mg/dL (ref 5–40)

## 2020-12-25 ENCOUNTER — Telehealth: Payer: Self-pay | Admitting: Pharmacist

## 2020-12-25 MED ORDER — ROSUVASTATIN CALCIUM 20 MG PO TABS: 20.0000 mg | ORAL_TABLET | Freq: Every day | ORAL | 3 refills | Status: DC

## 2020-12-25 MED ORDER — PRALUENT 75 MG/ML ~~LOC~~ SOAJ: 1.0000 "pen " | SUBCUTANEOUS | 11 refills | Status: DC

## 2020-12-25 NOTE — Telephone Encounter (Signed)
Called pt and left message to discuss lipid results. LDL has increased from 22 to 204, assume that pt has stopped both atorvastatin 40mg  and Praluent 150mg  based on drastic increase in LDL.  Pt was changed from Repatha to Praluent in February due to insurance requirement/change in formulary. He did send in MyChart message on 3/21 with reports of muscle pain/stiffness and was advised to skip 1 dose of Praluent to see if his symptoms improved.

## 2020-12-25 NOTE — Telephone Encounter (Addendum)
Pt returned call, reports he stopped both Praluent and atorvastatin. States pain had a gradual onset so he's not sure if his Repatha caused aches like his Praluent did.  He is willing to try lower dose of Praluent 75mg  Q2W. Will also change atorvastatin 40mg  to rosuvastatin 20mg  daily due to better side effect profile. Will call pt in 1-2 months to follow up with tolerability and schedule labs. Advised pharmacy to apply copay card from Praluent 150mg  rx to 75mg  rx.

## 2021-01-02 ENCOUNTER — Emergency Department (HOSPITAL_COMMUNITY): Payer: No Typology Code available for payment source

## 2021-01-02 ENCOUNTER — Other Ambulatory Visit: Payer: Self-pay

## 2021-01-02 ENCOUNTER — Telehealth: Payer: Self-pay | Admitting: Internal Medicine

## 2021-01-02 ENCOUNTER — Observation Stay (HOSPITAL_COMMUNITY)
Admission: EM | Admit: 2021-01-02 | Discharge: 2021-01-03 | Disposition: A | Discharge status: Home / Self Care | Payer: No Typology Code available for payment source | Attending: Internal Medicine | Admitting: Internal Medicine

## 2021-01-02 ENCOUNTER — Encounter (HOSPITAL_COMMUNITY): Payer: Self-pay

## 2021-01-02 DIAGNOSIS — Z79899 Other long term (current) drug therapy: Secondary | ICD-10-CM | POA: Insufficient documentation

## 2021-01-02 DIAGNOSIS — I10 Essential (primary) hypertension: Secondary | ICD-10-CM | POA: Insufficient documentation

## 2021-01-02 DIAGNOSIS — Z20822 Contact with and (suspected) exposure to covid-19: Secondary | ICD-10-CM | POA: Insufficient documentation

## 2021-01-02 DIAGNOSIS — Z7982 Long term (current) use of aspirin: Secondary | ICD-10-CM | POA: Insufficient documentation

## 2021-01-02 DIAGNOSIS — I2510 Atherosclerotic heart disease of native coronary artery without angina pectoris: Secondary | ICD-10-CM | POA: Diagnosis not present

## 2021-01-02 DIAGNOSIS — R001 Bradycardia, unspecified: Secondary | ICD-10-CM | POA: Insufficient documentation

## 2021-01-02 DIAGNOSIS — R55 Syncope and collapse: Principal | ICD-10-CM | POA: Insufficient documentation

## 2021-01-02 LAB — URINALYSIS, ROUTINE W REFLEX MICROSCOPIC
Bilirubin Urine: NEGATIVE
Glucose, UA: NEGATIVE mg/dL
Hgb urine dipstick: NEGATIVE
Ketones, ur: 5 mg/dL — AB
Leukocytes,Ua: NEGATIVE
Nitrite: NEGATIVE
Protein, ur: NEGATIVE mg/dL
Specific Gravity, Urine: 1.012 (ref 1.005–1.030)
pH: 8 (ref 5.0–8.0)

## 2021-01-02 LAB — COMPREHENSIVE METABOLIC PANEL
ALT: 34 U/L (ref 0–44)
AST: 27 U/L (ref 15–41)
Albumin: 3.9 g/dL (ref 3.5–5.0)
Alkaline Phosphatase: 43 U/L (ref 38–126)
Anion gap: 10 (ref 5–15)
BUN: 16 mg/dL (ref 6–20)
CO2: 22 mmol/L (ref 22–32)
Calcium: 9.1 mg/dL (ref 8.9–10.3)
Chloride: 106 mmol/L (ref 98–111)
Creatinine, Ser: 1.14 mg/dL (ref 0.61–1.24)
GFR, Estimated: >60 mL/min (ref >60)
Glucose, Bld: 145 mg/dL — ABNORMAL HIGH (ref 70–99)
Potassium: 3.5 mmol/L (ref 3.5–5.1)
Sodium: 138 mmol/L (ref 135–145)
Total Bilirubin: 1.3 mg/dL — ABNORMAL HIGH (ref 0.3–1.2)
Total Protein: 6.6 g/dL (ref 6.5–8.1)

## 2021-01-02 LAB — CBC
HCT: 42 % (ref 39.0–52.0)
Hemoglobin: 14.7 g/dL (ref 13.0–17.0)
MCH: 30.4 pg (ref 26.0–34.0)
MCHC: 35 g/dL (ref 30.0–36.0)
MCV: 87 fL (ref 80.0–100.0)
Platelets: 151 10*3/uL (ref 150–400)
RBC: 4.83 MIL/uL (ref 4.22–5.81)
RDW: 12.1 % (ref 11.5–15.5)
WBC: 12 10*3/uL — ABNORMAL HIGH (ref 4.0–10.5)
nRBC: 0 % (ref 0.0–0.2)

## 2021-01-02 LAB — D-DIMER, QUANTITATIVE: D-Dimer, Quant: 0.51 ug{FEU}/mL — ABNORMAL HIGH (ref 0.00–0.50)

## 2021-01-02 LAB — DIFFERENTIAL
Abs Immature Granulocytes: 0.08 10*3/uL — ABNORMAL HIGH (ref 0.00–0.07)
Basophils Absolute: 0.1 10*3/uL (ref 0.0–0.1)
Basophils Relative: 1 %
Eosinophils Absolute: 0.1 10*3/uL (ref 0.0–0.5)
Eosinophils Relative: 0 %
Immature Granulocytes: 1 %
Lymphocytes Relative: 8 %
Lymphs Abs: 1 10*3/uL (ref 0.7–4.0)
Monocytes Absolute: 1 10*3/uL (ref 0.1–1.0)
Monocytes Relative: 8 %
Neutro Abs: 9.9 10*3/uL — ABNORMAL HIGH (ref 1.7–7.7)
Neutrophils Relative %: 82 %

## 2021-01-02 LAB — RAPID URINE DRUG SCREEN, HOSP PERFORMED
Amphetamines: NOT DETECTED
Barbiturates: NOT DETECTED
Benzodiazepines: NOT DETECTED
Cocaine: NOT DETECTED
Opiates: NOT DETECTED
Tetrahydrocannabinol: NOT DETECTED

## 2021-01-02 LAB — RESP PANEL BY RT-PCR (FLU A&B, COVID) ARPGX2
Influenza A by PCR: NEGATIVE
Influenza B by PCR: NEGATIVE
SARS Coronavirus 2 by RT PCR: NEGATIVE

## 2021-01-02 LAB — CK: Total CK: 179 U/L (ref 49–397)

## 2021-01-02 LAB — TROPONIN I (HIGH SENSITIVITY)
Troponin I (High Sensitivity): 7 ng/L
Troponin I (High Sensitivity): 7 ng/L (ref <18)

## 2021-01-02 LAB — CBG MONITORING, ED: Glucose-Capillary: 137 mg/dL — ABNORMAL HIGH (ref 70–99)

## 2021-01-02 MED ORDER — SODIUM CHLORIDE 0.9% FLUSH
3.0000 mL | Freq: Two times a day (BID) | INTRAVENOUS | Status: DC
  Administered 2021-01-02: 3 mL via INTRAVENOUS

## 2021-01-02 MED ORDER — ONDANSETRON HCL 4 MG/2ML IJ SOLN
4.0000 mg | Freq: Once | INTRAMUSCULAR | Status: AC
  Administered 2021-01-02: 4 mg via INTRAVENOUS
  Filled 2021-01-02: qty 2

## 2021-01-02 MED ORDER — IRBESARTAN 150 MG PO TABS
150.0000 mg | ORAL_TABLET | Freq: Every day | ORAL | Status: DC
  Administered 2021-01-02 – 2021-01-03 (×2): 150 mg via ORAL
  Filled 2021-01-02 (×2): qty 1

## 2021-01-02 MED ORDER — ENOXAPARIN SODIUM 40 MG/0.4ML IJ SOSY
40.0000 mg | PREFILLED_SYRINGE | INTRAMUSCULAR | Status: DC
  Administered 2021-01-02: 40 mg via SUBCUTANEOUS
  Filled 2021-01-02: qty 0.4

## 2021-01-02 MED ORDER — ROSUVASTATIN CALCIUM 20 MG PO TABS
20.0000 mg | ORAL_TABLET | Freq: Every day | ORAL | Status: DC
  Administered 2021-01-02 – 2021-01-03 (×2): 20 mg via ORAL
  Filled 2021-01-02 (×2): qty 1

## 2021-01-02 MED ORDER — SODIUM CHLORIDE 0.9 % IV SOLN: INTRAVENOUS | Status: DC

## 2021-01-02 MED ORDER — ASPIRIN EC 81 MG PO TBEC
81.0000 mg | DELAYED_RELEASE_TABLET | Freq: Every day | ORAL | Status: DC
  Administered 2021-01-02 – 2021-01-03 (×2): 81 mg via ORAL
  Filled 2021-01-02 (×2): qty 1

## 2021-01-02 MED ORDER — POTASSIUM CHLORIDE CRYS ER 20 MEQ PO TBCR
40.0000 meq | EXTENDED_RELEASE_TABLET | Freq: Once | ORAL | Status: AC
  Administered 2021-01-02: 40 meq via ORAL
  Filled 2021-01-02: qty 4

## 2021-01-02 MED ORDER — HYDROCHLOROTHIAZIDE 25 MG PO TABS
12.5000 mg | ORAL_TABLET | Freq: Every day | ORAL | Status: DC
  Administered 2021-01-02 – 2021-01-03 (×2): 12.5 mg via ORAL
  Filled 2021-01-02 (×2): qty 1

## 2021-01-02 MED ORDER — SODIUM CHLORIDE 0.9 % IV BOLUS
500.0000 mL | Freq: Once | INTRAVENOUS | Status: AC
  Administered 2021-01-02: 500 mL via INTRAVENOUS

## 2021-01-02 MED ORDER — SERTRALINE HCL 50 MG PO TABS
100.0000 mg | ORAL_TABLET | Freq: Every day | ORAL | Status: DC
  Administered 2021-01-02 – 2021-01-03 (×2): 100 mg via ORAL
  Filled 2021-01-02 (×2): qty 2

## 2021-01-02 NOTE — ED Notes (Signed)
Lab called to collect CK and urine drug screen.

## 2021-01-02 NOTE — ED Provider Notes (Signed)
[facility] Provider [facility]   CSN: [id number] Arrival date & time: 01/02/21  [id number]     History Chief Complaint  Patient presents with   Near Syncope    Francisco White is a 50 y.o. male.  Patient brought in by EMS.  Patient with a syncopal episode this morning getting out of bed witnessed by his wife.  Wife confirms that he was completely unconscious.  He felt he was not breathing at all.  Seem to turn blue.  Patient states that he remembers EMS coming but he could not talk or move.  Patient's wife thinks he hit his head on the dresser.  Patient without any complaint of head pain at this time.  Patient has been on both of his current blood pressure medicines for over 2 months.  No change.  There was a change in his lipid medicine recently because he was having trouble with a lot of muscle cramps.  Patient followed by cardiology locally.  Patient had a LAD stent placed in 2010.  Is also known to have hypertension hyperlipidemia.  Last saw Dr. Tenny Crawoss in June 2021.  But the cardiology pharmacist has been adjusting his lipid medicines.  Patient denies any chest pain states he felt short of breath in the ambulance and felt short of breath at home.  But does not feel short of breath now.      Past Medical History:  Diagnosis Date   CAD (coronary artery disease)    Depression 03/26/2013   Family history of heart disease    History of nuclear stress test 06/2011   exercise; negative for ischemia, normal pattern of perfusion    HTN (hypertension) 03/26/2013   Hyperlipidemia    Hypertension    Skin sensation disturbance 04/07/2017   Umbilical hernia 03/26/2013    Patient Active Problem List   Diagnosis Date Noted   Skin sensation disturbance 04/07/2017   B12 deficiency 07/10/2016   HTN (hypertension) 03/26/2013   CAD (coronary artery disease) 03/26/2013   Hyperlipidemia 03/26/2013   Depression, major, in remission (HCC) 03/26/2013   Umbilical hernia  03/26/2013    Past Surgical History:  Procedure Laterality Date   APPENDECTOMY  1977   CORONARY ANGIOPLASTY WITH STENT PLACEMENT  10/2008   xience 3.5x6118mm stent to LAD ([place])   HERNIA REPAIR  1996   TRANSTHORACIC ECHOCARDIOGRAM  2010   EF ~65%; borderline LA enlargement        Family History  Problem Relation Age of Onset   Heart disease Father        CABG at 4352   Heart disease Paternal Grandmother    Heart disease Paternal Grandfather    Depression Mother     Social History   Tobacco Use   Smoking status: Never   Smokeless tobacco: Never  Substance Use Topics   Alcohol use: Yes    Comment: occasional    Drug use: No    Home Medications Prior to Admission medications   Medication Sig Start Date End Date Taking? Authorizing Provider  Alirocumab (PRALUENT) 75 MG/ML SOAJ Inject 1 pen into the skin every 14 (fourteen) days. 12/25/20  Yes Pricilla Riffleoss, Paula V, MD  aspirin 81 MG tablet Take 81 mg by mouth daily.   Yes [provider]  hydrochlorothiazide (HYDRODIURIL) 12.5 MG tablet Take 1 tablet by mouth once daily. Patient taking differently: Take 12.5 mg by mouth daily. 10/18/20  Yes [place], Betty G, MD  olmesartan (BENICAR) 20 MG tablet Take  1 tablet (20 mg total) by mouth daily. 11/28/20  Yes [place], Betty G, MD  rosuvastatin (CRESTOR) 20 MG tablet Take 1 tablet (20 mg total) by mouth daily. 12/25/20  Yes Pricilla Riffle, MD  sertraline (ZOLOFT) 100 MG tablet Take 1 tablet by mouth daily. Patient taking differently: Take 100 mg by mouth daily. 10/21/20  Yes [place], Betty G, MD    Allergies    Simvastatin  Review of Systems   Review of Systems  Constitutional:  Negative for chills and fever.  HENT:  Negative for congestion, rhinorrhea and sore throat.   Eyes:  Negative for visual disturbance.  Respiratory:  Positive for shortness of breath. Negative for cough.   Cardiovascular:  Negative for chest pain and leg swelling.  Gastrointestinal:  Negative for  abdominal pain, diarrhea, nausea and vomiting.  Genitourinary:  Negative for dysuria.  Musculoskeletal:  Negative for back pain and neck pain.  Skin:  Negative for rash.  Neurological:  Positive for syncope. Negative for dizziness, seizures, weakness, light-headedness, numbness and headaches.  Hematological:  Does not bruise/bleed easily.  Psychiatric/Behavioral:  Negative for confusion.    Physical Exam Updated Vital Signs BP 136/72   Pulse (!) 55   Temp 97.6 F (36.4 C) (Rectal)   Resp 14   Ht 1.88 m (6\' 2" )   Wt 111.1 kg   SpO2 100%   BMI 31.46 kg/m   Physical Exam Vitals and nursing note reviewed.  Constitutional:      General: He is not in acute distress.    Appearance: Normal appearance. He is well-developed.  HENT:     Head: Normocephalic and atraumatic.     Mouth/Throat:     Mouth: Mucous membranes are dry.  Eyes:     Extraocular Movements: Extraocular movements intact.     Conjunctiva/sclera: Conjunctivae normal.     Pupils: Pupils are equal, round, and reactive to light.  Cardiovascular:     Rate and Rhythm: Regular rhythm. Bradycardia present.     Heart sounds: No murmur heard. Pulmonary:     Effort: Pulmonary effort is normal. No respiratory distress.     Breath sounds: Normal breath sounds.  Abdominal:     Palpations: Abdomen is soft.     Tenderness: There is no abdominal tenderness.  Musculoskeletal:        General: Normal range of motion.     Cervical back: Normal range of motion and neck supple.  Skin:    General: Skin is warm and dry.     Capillary Refill: Capillary refill takes 2 to 3 seconds.  Neurological:     General: No focal deficit present.     Mental Status: He is alert and oriented to person, place, and time.     Cranial Nerves: No cranial nerve deficit.     Sensory: No sensory deficit.     Motor: No weakness.     Coordination: Coordination normal.     Gait: Gait normal.    ED Results / Procedures / Treatments   Labs (all labs  ordered are listed, but only abnormal results are displayed) Labs Reviewed  COMPREHENSIVE METABOLIC PANEL - Abnormal; Notable for the following components:      Result Value   Glucose, Bld 145 (*)    Total Bilirubin 1.3 (*)    All other components within normal limits  D-DIMER, QUANTITATIVE - Abnormal; Notable for the following components:   D-Dimer, Quant 0.51 (*)    All other components within normal limits  CBC - Abnormal; Notable for the following components:   WBC 12.0 (*)    All other components within normal limits  DIFFERENTIAL - Abnormal; Notable for the following components:   Neutro Abs 9.9 (*)    Abs Immature Granulocytes 0.08 (*)    All other components within normal limits  URINALYSIS, ROUTINE W REFLEX MICROSCOPIC - Abnormal; Notable for the following components:   Ketones, ur 5 (*)    All other components within normal limits  CBG MONITORING, ED - Abnormal; Notable for the following components:   Glucose-Capillary 137 (*)    All other components within normal limits  RESP PANEL BY RT-PCR (FLU A&B, COVID) ARPGX2  CBC WITH DIFFERENTIAL/PLATELET  CK  RAPID URINE DRUG SCREEN, HOSP PERFORMED  TROPONIN I (HIGH SENSITIVITY)  TROPONIN I (HIGH SENSITIVITY)    EKG EKG Interpretation  Date/Time:  Thursday January 02 2021 07:20:46 EDT Ventricular Rate:  50 PR Interval:  170 QRS Duration: 114 QT Interval:  461 QTC Calculation: 421 R Axis:   -13 Text Interpretation: Sinus rhythm Borderline intraventricular conduction delay Confirmed by Vanetta Mulders [phone]) on 01/02/2021 7:24:43 AM  Radiology CT Head Wo Contrast  Result Date: 01/02/2021 CLINICAL DATA:  Syncopal episode EXAM: CT HEAD WITHOUT CONTRAST TECHNIQUE: Contiguous axial images were obtained from the base of the skull through the vertex without intravenous contrast. COMPARISON:  None. FINDINGS: Brain: There is no acute intracranial hemorrhage, mass effect, or edema. Gray-white differentiation is preserved. There is  no extra-axial fluid collection. Ventricles and sulci are within normal limits in size and configuration. Vascular: No hyperdense vessel or unexpected calcification. Skull: Calvarium is unremarkable. Sinuses/Orbits: Retention cysts of maxillary sinuses. Orbits are unremarkable. Other: None. IMPRESSION: No acute intracranial abnormality. Electronically Signed   By: Guadlupe Spanish M.D.   On: 01/02/2021 10:16   DG Chest Port 1 View  Result Date: 01/02/2021 CLINICAL DATA:  Syncope. EXAM: PORTABLE CHEST 1 VIEW COMPARISON:  No prior. FINDINGS: Mediastinum hilar structures normal. Heart size upper limits normal. No pulmonary venous congestion. No focal infiltrate. No pleural effusion or pneumothorax. No acute bony abnormality. IMPRESSION: Heart size upper limits normal. No pulmonary venous congestion. No acute pulmonary disease. Electronically Signed   By: Maisie Fus  Register   On: 01/02/2021 08:20    Procedures Procedures  CRITICAL CARE Performed by: Vanetta Mulders Total critical care time: 35 minutes Critical care time was exclusive of separately billable procedures and treating other patients. Critical care was necessary to treat or prevent imminent or life-threatening deterioration. Critical care was time spent personally by me on the following activities: development of treatment plan with patient and/or surrogate as well as nursing, discussions with consultants, evaluation of patient's response to treatment, examination of patient, obtaining history from patient or surrogate, ordering and performing treatments and interventions, ordering and review of laboratory studies, ordering and review of radiographic studies, pulse oximetry and re-evaluation of patient's condition.   Medications Ordered in ED Medications  0.9 %  sodium chloride infusion ( Intravenous New Bag/Given 01/02/21 0813)  ondansetron (ZOFRAN) injection 4 mg (4 mg Intravenous Given 01/02/21 0810)  sodium chloride 0.9 % bolus 500 mL (500 mLs  Intravenous Bolus 01/02/21 0810)    ED Course  I have reviewed the triage vital signs and the nursing notes.  Pertinent labs & imaging results that were available during my care of the patient were reviewed by me and considered in my medical decision making (see chart for details).    MDM Rules/Calculators/A&P  Patient with a definite syncopal episode.  Without good explanation for why that occurred.  Labs without significant abnormalities.  Urinalysis is negative as well.  COVID testing influenza testing negative.  Patient was feeling fine yesterday.  Feels fine now.  No focal neurological deficit.  Wife denies any seizure activity but I still think this is a possibility as well as syncope related to an arrhythmia.  Heart rate was in the upper 40s when he got here.  Is been mostly in the 50s.  But patient without any orthostatic hypotension.  Ambulated in the room fine without any symptoms at all.  So we do not have a good clear explanation for what happened.  Head CT was negative that was done mostly because his wife felt he hit his head.  Could have been a component of loss of consciousness on top of the syncope.  Chest x-ray negative.  Troponins x2 without any significant change.  Patient with unexplained syncope possible seizure discussed with hospitalist who will see for admission. Final Clinical Impression(s) / ED Diagnoses Final diagnoses:  Syncope and collapse    Rx / DC Orders ED Discharge Orders     None        Vanetta Mulders, MD 01/02/21 1329

## 2021-01-02 NOTE — ED Notes (Signed)
Report called to [facility] nurse. Francisco White

## 2021-01-02 NOTE — Telephone Encounter (Signed)
Patient's wife called to say that the patient is in the hospital. And she would like for Dr. Tenny Craw to oversee his care. Please advise

## 2021-01-02 NOTE — Telephone Encounter (Signed)
Pt is in hospital after syncopal episode this am. Will route to Dr. Tenny Craw to make aware.

## 2021-01-02 NOTE — ED Notes (Signed)
[facility] called for transport to [facility].  

## 2021-01-02 NOTE — H&P (Signed)
History and Physical    Francisco White EQA:[id number] DOB: 10/12/1970 DOA: 01/02/2021  PCP: [place], Betty G, MD (Confirm with patient/family/NH records and if not entered, this has to be entered at [facility] point of entry) Patient coming from: Home  I have personally briefly reviewed patient's old medical records in [facility]  Chief Complaint: I passed out  HPI: Francisco White is a 50 y.o. male with medical history significant of CAD with 1 stent, HTN, HLD, anxiety depression, presented with syncope.  Patient has a history of sinus bradycardia but has been stable, no history of syncope.  But patient reports that occasionally 1-2 times a month, he feels lightheadedness when standing up.  This morning, patient woke up and tried to get out of the bed then collapsed while sitting on the side of the bed, witnessed by wife.  Wife also noticed the patient did not try to got up again and then fell again.  Then patient lost consciousness while walking to the bathroom and hit his head, patient looked pale, but denied any limb twitching.  Patient regained consciousness himself and remained confused, and complaining about feeling nausea.  No loss control of urine no tongue biting.  Patient denies any chest pain shortness of breath palpitations.  Patient does not recall any prodromes of lightheadedness or palpitations or sweating before this episode happened.  EMS arrived and found patient heart rate in the 40s.  Wife also reported patient switched from Repatha to Praluent 2 weeks ago, has been having muscle aching upper extremities especially the left shoulder. ED Course: Patient continues to complain about nauseous and 1 dose of Zofran given heart rate 52 in the ED.  Heart rate occasionally dropped to upper 40s.  Potassium 3.5, creatinine 1.1.  Review of Systems: As per HPI otherwise 14 point review of systems negative.    Past Medical History:  Diagnosis Date   CAD (coronary artery disease)     Depression 03/26/2013   Family history of heart disease    History of nuclear stress test 06/2011   exercise; negative for ischemia, normal pattern of perfusion    HTN (hypertension) 03/26/2013   Hyperlipidemia    Hypertension    Skin sensation disturbance 04/07/2017   Umbilical hernia 03/26/2013    Past Surgical History:  Procedure Laterality Date   APPENDECTOMY  1977   CORONARY ANGIOPLASTY WITH STENT PLACEMENT  10/2008   xience 3.5x81mm stent to LAD ([place])   HERNIA REPAIR  1996   TRANSTHORACIC ECHOCARDIOGRAM  2010   EF ~65%; borderline LA enlargement      reports that he has never smoked. He has never used smokeless tobacco. He reports current alcohol use. He reports that he does not use drugs.  Allergies  Allergen Reactions   Simvastatin Other (See Comments)    Myalgias     Family History  Problem Relation Age of Onset   Heart disease Father        CABG at 23   Heart disease Paternal Grandmother    Heart disease Paternal Grandfather    Depression Mother      Prior to Admission medications   Medication Sig Start Date End Date Taking? Authorizing Provider  Alirocumab (PRALUENT) 75 MG/ML SOAJ Inject 1 pen into the skin every 14 (fourteen) days. 12/25/20  Yes Pricilla Riffle, MD  aspirin 81 MG tablet Take 81 mg by mouth daily.   Yes [provider]  hydrochlorothiazide (HYDRODIURIL) 12.5 MG tablet Take 1 tablet  by mouth once daily. Patient taking differently: Take 12.5 mg by mouth daily. 10/18/20  Yes [place], Betty G, MD  olmesartan (BENICAR) 20 MG tablet Take 1 tablet (20 mg total) by mouth daily. 11/28/20  Yes [place], Betty G, MD  rosuvastatin (CRESTOR) 20 MG tablet Take 1 tablet (20 mg total) by mouth daily. 12/25/20  Yes Pricilla Riffle, MD  sertraline (ZOLOFT) 100 MG tablet Take 1 tablet by mouth daily. Patient taking differently: Take 100 mg by mouth daily. 10/21/20  Yes [place], Betty G, MD    Physical Exam: Vitals:   01/02/21 1300 01/02/21 1315 01/02/21  1330 01/02/21 1400  BP: 136/72  133/84 139/83  Pulse: (!) 55 (!) 58 (!) 52 (!) 50  Resp: 14 18 15 13   Temp:      TempSrc:      SpO2: 100% 100% 99% 100%  Weight:      Height:        Constitutional: NAD, calm, comfortable Vitals:   01/02/21 1300 01/02/21 1315 01/02/21 1330 01/02/21 1400  BP: 136/72  133/84 139/83  Pulse: (!) 55 (!) 58 (!) 52 (!) 50  Resp: 14 18 15 13   Temp:      TempSrc:      SpO2: 100% 100% 99% 100%  Weight:      Height:       Eyes: PERRL, lids and conjunctivae normal ENMT: Mucous membranes are moist. Posterior pharynx clear of any exudate or lesions.Normal dentition.  Neck: normal, supple, no masses, no thyromegaly Respiratory: clear to auscultation bilaterally, no wheezing, no crackles. Normal respiratory effort. No accessory muscle use.  Cardiovascular: Regular rate and rhythm, no murmurs / rubs / gallops. No extremity edema. 2+ pedal pulses. No carotid bruits.  Abdomen: no tenderness, no masses palpated. No hepatosplenomegaly. Bowel sounds positive.  Musculoskeletal: no clubbing / cyanosis. No joint deformity upper and lower extremities. Good ROM, no contractures. Normal muscle tone.  Skin: no rashes, lesions, ulcers. No induration Neurologic: CN 2-12 grossly intact. Sensation intact, DTR normal. Strength 5/5 in all 4.  Psychiatric: Normal judgment and insight. Alert and oriented x 3. Normal mood.     Labs on Admission: I have personally reviewed following labs and imaging studies  CBC: Recent Labs  Lab 01/02/21 0805  WBC 12.0*  NEUTROABS 9.9*  HGB 14.7  HCT 42.0  MCV 87.0  PLT 151   Basic Metabolic Panel: Recent Labs  Lab 01/02/21 0717  NA 138  K 3.5  CL 106  CO2 22  GLUCOSE 145*  BUN 16  CREATININE 1.14  CALCIUM 9.1   GFR: Estimated Creatinine Clearance: 100.6 mL/min (by C-G formula based on SCr of 1.14 mg/dL). Liver Function Tests: Recent Labs  Lab 01/02/21 0717  AST 27  ALT 34  ALKPHOS 43  BILITOT 1.3*  PROT 6.6  ALBUMIN  3.9   No results for input(s): LIPASE, AMYLASE in the last 168 hours. No results for input(s): AMMONIA in the last 168 hours. Coagulation Profile: No results for input(s): INR, PROTIME in the last 168 hours. Cardiac Enzymes: Recent Labs  Lab 01/02/21 1312  CKTOTAL 179   BNP (last 3 results) No results for input(s): PROBNP in the last 8760 hours. HbA1C: No results for input(s): HGBA1C in the last 72 hours. CBG: Recent Labs  Lab 01/02/21 0714  GLUCAP 137*   Lipid Profile: No results for input(s): CHOL, HDL, LDLCALC, TRIG, CHOLHDL, LDLDIRECT in the last 72 hours. Thyroid Function Tests: No results for input(s): TSH, T4TOTAL, FREET4,  T3FREE, THYROIDAB in the last 72 hours. Anemia Panel: No results for input(s): VITAMINB12, FOLATE, FERRITIN, TIBC, IRON, RETICCTPCT in the last 72 hours. Urine analysis:    Component Value Date/Time   COLORURINE YELLOW 01/02/2021 1031   APPEARANCEUR CLEAR 01/02/2021 1031   LABSPEC 1.012 01/02/2021 1031   PHURINE 8.0 01/02/2021 1031   GLUCOSEU NEGATIVE 01/02/2021 1031   HGBUR NEGATIVE 01/02/2021 1031   BILIRUBINUR NEGATIVE 01/02/2021 1031   BILIRUBINUR neg 07/03/2016 0829   KETONESUR 5 (A) 01/02/2021 1031   PROTEINUR NEGATIVE 01/02/2021 1031   UROBILINOGEN 1.0 07/03/2016 0829   NITRITE NEGATIVE 01/02/2021 1031   LEUKOCYTESUR NEGATIVE 01/02/2021 1031    Radiological Exams on Admission: CT Head Wo Contrast  Result Date: 01/02/2021 CLINICAL DATA:  Syncopal episode EXAM: CT HEAD WITHOUT CONTRAST TECHNIQUE: Contiguous axial images were obtained from the base of the skull through the vertex without intravenous contrast. COMPARISON:  None. FINDINGS: Brain: There is no acute intracranial hemorrhage, mass effect, or edema. Gray-white differentiation is preserved. There is no extra-axial fluid collection. Ventricles and sulci are within normal limits in size and configuration. Vascular: No hyperdense vessel or unexpected calcification. Skull: Calvarium  is unremarkable. Sinuses/Orbits: Retention cysts of maxillary sinuses. Orbits are unremarkable. Other: None. IMPRESSION: No acute intracranial abnormality. Electronically Signed   By: Guadlupe Spanish M.D.   On: 01/02/2021 10:16   DG Chest Port 1 View  Result Date: 01/02/2021 CLINICAL DATA:  Syncope. EXAM: PORTABLE CHEST 1 VIEW COMPARISON:  No prior. FINDINGS: Mediastinum hilar structures normal. Heart size upper limits normal. No pulmonary venous congestion. No focal infiltrate. No pleural effusion or pneumothorax. No acute bony abnormality. IMPRESSION: Heart size upper limits normal. No pulmonary venous congestion. No acute pulmonary disease. Electronically Signed   By: Maisie Fus  Register   On: 01/02/2021 08:20    EKG: Independently reviewed.  Sinus bradycardia  Assessment/Plan Active Problems:   Syncope  (please populate well all problems here in Problem List. (For example, if patient is on BP meds at home and you resume or decide to hold them, it is a problem that needs to be her. Same for CAD, COPD, HLD and so on)   Syncope versus seizure -Syncope likely more, concerned about sinus bradycardia.  Wife described patient does have heavy snoring at night.  Recommend outpatient sleep study. -Telemetry monitoring overnight, if negative recommend outpatient follow-up with cardiologist for loop/event monitoring. -Orthostatic vital signs -No history of EGD, low suspicion.  Ordered EEG.. -Check echocardiogram  Sinus bradycardia -Suspect symptomatic bradycardia is the cause for syncope.  Patient not on any beta-blocker.  Check TSH, recommend outpatient sleep study to rule out OSA as above.  HTN -Stable continue HCTZ and ARB  HLD -CK within normal limits -Continue Praluent  CAD -No chest pains, troponin negative x2.  Is at follow-up.    DVT prophylaxis: Lovenox Code Status: Full code Family Communication: Wife at bedside Disposition Plan: Expect less than 2 midnight hospital stay Consults  called: None Admission status: Telemetry observation   Emeline General MD [facility] Pager [phone]  01/02/2021, 3:01 PM

## 2021-01-02 NOTE — ED Triage Notes (Signed)
Brought in by EMS from home c/o syncope episode. EMS reports pt tried to get up this morning and pass out. Pt reports hitter his head in the floor. No injury noted. Pt reports new blood pressure medicine started yesterday.  500 ml NS and 4mg  IV zofran given by EMS.   BP 111/75 HR 52 RR 02Sat 100 on RA CBG 135

## 2021-01-03 ENCOUNTER — Other Ambulatory Visit: Payer: Self-pay

## 2021-01-03 ENCOUNTER — Observation Stay (HOSPITAL_COMMUNITY): Payer: No Typology Code available for payment source

## 2021-01-03 ENCOUNTER — Other Ambulatory Visit (HOSPITAL_COMMUNITY): Payer: No Typology Code available for payment source

## 2021-01-03 ENCOUNTER — Observation Stay (HOSPITAL_BASED_OUTPATIENT_CLINIC_OR_DEPARTMENT_OTHER): Payer: No Typology Code available for payment source

## 2021-01-03 DIAGNOSIS — I2510 Atherosclerotic heart disease of native coronary artery without angina pectoris: Secondary | ICD-10-CM

## 2021-01-03 DIAGNOSIS — R55 Syncope and collapse: Secondary | ICD-10-CM

## 2021-01-03 LAB — ECHOCARDIOGRAM COMPLETE
AR max vel: 2.75 cm2
AV Area VTI: 2.66 cm2
AV Area mean vel: 2.61 cm2
AV Mean grad: 6 mmHg
AV Peak grad: 11.4 mmHg
Ao pk vel: 1.69 m/s
Area-P 1/2: 3.12 cm2
Height: 74 in
MV VTI: 2.72 cm2
S' Lateral: 2.4 cm
Weight: 3865.6 oz

## 2021-01-03 LAB — HIV ANTIBODY (ROUTINE TESTING W REFLEX): HIV Screen 4th Generation wRfx: NONREACTIVE

## 2021-01-03 LAB — BASIC METABOLIC PANEL
Anion gap: 7 (ref 5–15)
BUN: 11 mg/dL (ref 6–20)
CO2: 25 mmol/L (ref 22–32)
Calcium: 8.8 mg/dL — ABNORMAL LOW (ref 8.9–10.3)
Chloride: 106 mmol/L (ref 98–111)
Creatinine, Ser: 1.14 mg/dL (ref 0.61–1.24)
GFR, Estimated: >60 mL/min (ref >60)
Glucose, Bld: 104 mg/dL — ABNORMAL HIGH (ref 70–99)
Potassium: 4 mmol/L (ref 3.5–5.1)
Sodium: 138 mmol/L (ref 135–145)

## 2021-01-03 LAB — TSH: TSH: 2.615 u[IU]/mL (ref 0.350–4.500)

## 2021-01-03 LAB — FOLATE: Folate: 12.8 ng/mL (ref >5.9)

## 2021-01-03 LAB — VITAMIN B12: Vitamin B-12: 231 pg/mL (ref 180–914)

## 2021-01-03 LAB — GLUCOSE, CAPILLARY: Glucose-Capillary: 109 mg/dL — ABNORMAL HIGH (ref 70–99)

## 2021-01-03 NOTE — Discharge Summary (Signed)
Physician Discharge Summary  Francisco White OEV:[id number] DOB: 07-Oct-1970 DOA: 01/02/2021  PCP: [place], Betty G, MD  Admit date: 01/02/2021 Discharge date: 01/03/2021  Admitted From: Home Disposition:  Home  Recommendations for Outpatient Follow-up:  Follow up with PCP in 1-2 weeks Please obtain BMP/CBC in one week Outpatient sleep study  Home Health: None  Equipment/Devices:none  Discharge Condition:stable  CODE STATUS:Full code  Diet recommendation: low sodium diet  Brief/Interim Summary: Francisco White is a 50 y.o. male with medical history significant of CAD with 1 stent, HTN, HLD, anxiety depression, presented with syncope.  ED course: Afebrile, heart rate noted to be in 50s.  EKG shows sinus bradycardia.  He was nauseous and 1 dose of Zofran given.  His heart rate dropped to upper 40s.  Potassium 3.5, creatinine 1.1.  Patient started on gentle IV hydration. CT head and chest x-ray was negative.  Admitted for further evaluation and management.  Syncope: -Symptomatic bradycardia versus underlying OSA -Chest x-ray and CT head negative for acute findings. -Echo shows preserved ejection fraction.  Normal diastolic function. -EEG is pending.:  WNL.  Urine drug screen: Negative, urine negative for infection. -PT recommended no PT follow-up.  Vital signs remained stable.  On room air.  Sinus bradycardia:  -Reviewed EKG.  Troponin x2 negative.  Reviewed echo.  Reviewed home meds-not on beta-blocker -Currently heart rate in 60s.  He is asymptomatic. -Needs outpatient sleep study to rule out sleep apnea.  Numbness/tingling in both arms: Chronic -B12, folate, TSH all are within normal limits. -Follow-up with PCP  Hypertension: Remained stable -Continued home meds of HCTZ and ARB  Hyperlipidemia: CK level within normal limit -Continued statin  Coronary artery disease: Patient denied ACS symptoms. -Troponin x2 negative.  Continued home meds  Obesity with BMI of 31: -Diet  modification/exercise and weight loss recommended  Discharge Diagnoses:  Syncope Sinus bradycardia Numbness tingling sensation in both arms Hypertension Hyperlipidemia Coronary artery disease Obesity with BMI of 31  Discharge Instructions  Discharge Instructions     Diet - low sodium heart healthy   Complete by: As directed    Discharge instructions   Complete by: As directed    Follow-up with PCP in 1 to 2 weeks Outpatient sleep study to rule out obstructive sleep apnea   Increase activity slowly   Complete by: As directed       Allergies as of 01/03/2021       Reactions   Simvastatin Other (See Comments)   Myalgias        Medication List     TAKE these medications    aspirin 81 MG tablet Take 81 mg by mouth daily.   hydrochlorothiazide 12.5 MG tablet Commonly known as: HYDRODIURIL Take 1 tablet by mouth once daily. What changed:  how much to take how to take this when to take this additional instructions   olmesartan 20 MG tablet Commonly known as: BENICAR Take 1 tablet (20 mg total) by mouth daily.   Praluent 75 MG/ML Soaj Generic drug: Alirocumab Inject 1 pen into the skin every 14 (fourteen) days.   rosuvastatin 20 MG tablet Commonly known as: CRESTOR Take 1 tablet (20 mg total) by mouth daily.   sertraline 100 MG tablet Commonly known as: ZOLOFT Take 1 tablet by mouth daily. What changed:  how much to take how to take this when to take this additional instructions        Allergies  Allergen Reactions   Simvastatin Other (See Comments)  Myalgias     Consultations: None   Procedures/Studies: CT Head Wo Contrast  Result Date: 01/02/2021 CLINICAL DATA:  Syncopal episode EXAM: CT HEAD WITHOUT CONTRAST TECHNIQUE: Contiguous axial images were obtained from the base of the skull through the vertex without intravenous contrast. COMPARISON:  None. FINDINGS: Brain: There is no acute intracranial hemorrhage, mass effect, or edema.  Gray-white differentiation is preserved. There is no extra-axial fluid collection. Ventricles and sulci are within normal limits in size and configuration. Vascular: No hyperdense vessel or unexpected calcification. Skull: Calvarium is unremarkable. Sinuses/Orbits: Retention cysts of maxillary sinuses. Orbits are unremarkable. Other: None. IMPRESSION: No acute intracranial abnormality. Electronically Signed   By: Guadlupe SpanishPraneil  Patel M.D.   On: 01/02/2021 10:16   DG Chest Port 1 View  Result Date: 01/02/2021 CLINICAL DATA:  Syncope. EXAM: PORTABLE CHEST 1 VIEW COMPARISON:  No prior. FINDINGS: Mediastinum hilar structures normal. Heart size upper limits normal. No pulmonary venous congestion. No focal infiltrate. No pleural effusion or pneumothorax. No acute bony abnormality. IMPRESSION: Heart size upper limits normal. No pulmonary venous congestion. No acute pulmonary disease. Electronically Signed   By: Maisie Fushomas  Register   On: 01/02/2021 08:20   ECHOCARDIOGRAM COMPLETE  Result Date: 01/03/2021    ECHOCARDIOGRAM REPORT   Patient Name:   Francisco White Date of Exam: 01/03/2021 Medical Rec #:  [id number]        Height:       74.0 in Accession #:    [id number]       Weight:       241.6 lb Date of Birth:  03-08-1971        BSA:          2.355 m Patient Age:    50 years         BP:           116/17 mmHg Patient Gender: M                HR:           63 bpm. Exam Location:  Inpatient Procedure: 2D Echo, Cardiac Doppler and Color Doppler Indications:    Syncope  History:        Patient has prior history of Echocardiogram examinations, most                 recent 10/27/2008. CAD; Risk Factors:Hypertension and                 Dyslipidemia. S/P PCI.  Sonographer:    Ross LudwigArthur Guy RDCS (AE) Referring Phys: [id number] Emeline GeneralPING T ZHANG IMPRESSIONS  1. Left ventricular ejection fraction, by estimation, is 60 to 65%. The left ventricle has normal function. The left ventricle has no regional wall motion abnormalities. There is mild concentric  left ventricular hypertrophy. Left ventricular diastolic parameters were normal.  2. Right ventricular systolic function is normal. The right ventricular size is mildly enlarged. Tricuspid regurgitation signal is inadequate for assessing PA pressure.  3. The mitral valve is normal in structure. No evidence of mitral valve regurgitation. No evidence of mitral stenosis.  4. The aortic valve was not well visualized. Aortic valve regurgitation is not visualized. Mild aortic valve sclerosis is present, with no evidence of aortic valve stenosis.  5. The inferior vena cava is normal in size with greater than 50% respiratory variability, suggesting right atrial pressure of 3 mmHg. FINDINGS  Left Ventricle: Left ventricular ejection fraction, by estimation, is 60 to 65%. The left ventricle has normal function. The  left ventricle has no regional wall motion abnormalities. The left ventricular internal cavity size was normal in size. There is  mild concentric left ventricular hypertrophy. Left ventricular diastolic parameters were normal. Normal left ventricular filling pressure. Right Ventricle: The right ventricular size is mildly enlarged. No increase in right ventricular wall thickness. Right ventricular systolic function is normal. Tricuspid regurgitation signal is inadequate for assessing PA pressure. Left Atrium: Left atrial size was normal in size. Right Atrium: Right atrial size was normal in size. Pericardium: There is no evidence of pericardial effusion. Mitral Valve: The mitral valve is normal in structure. No evidence of mitral valve regurgitation. No evidence of mitral valve stenosis. MV peak gradient, 5.9 mmHg. The mean mitral valve gradient is 2.0 mmHg. Tricuspid Valve: The tricuspid valve is normal in structure. Tricuspid valve regurgitation is not demonstrated. No evidence of tricuspid stenosis. Aortic Valve: The aortic valve was not well visualized. Aortic valve regurgitation is not visualized. Mild aortic  valve sclerosis is present, with no evidence of aortic valve stenosis. Aortic valve mean gradient measures 6.0 mmHg. Aortic valve peak gradient measures 11.4 mmHg. Aortic valve area, by VTI measures 2.66 cm. Pulmonic Valve: The pulmonic valve was normal in structure. Pulmonic valve regurgitation is not visualized. No evidence of pulmonic stenosis. Aorta: The aortic root is normal in size and structure. Venous: The inferior vena cava is normal in size with greater than 50% respiratory variability, suggesting right atrial pressure of 3 mmHg. IAS/Shunts: No atrial level shunt detected by color flow Doppler.  LEFT VENTRICLE PLAX 2D LVIDd:         4.10 cm  Diastology LVIDs:         2.40 cm  LV e' medial:    9.03 cm/s LV PW:         1.20 cm  LV E/e' medial:  12.6 LV IVS:        1.30 cm  LV e' lateral:   11.20 cm/s LVOT diam:     2.00 cm  LV E/e' lateral: 10.2 LV SV:         92 LV SV Index:   39 LVOT Area:     3.14 cm  RIGHT VENTRICLE             IVC RV Basal diam:  4.00 cm     IVC diam: 1.90 cm RV Mid diam:    3.20 cm RV S prime:     17.60 cm/s TAPSE (M-mode): 3.4 cm LEFT ATRIUM             Index       RIGHT ATRIUM           Index LA diam:        3.60 cm 1.53 cm/m  RA Area:     16.00 cm LA Vol (A2C):   65.7 ml 27.90 ml/m RA Volume:   40.60 ml  17.24 ml/m LA Vol (A4C):   88.5 ml 37.58 ml/m LA Biplane Vol: 80.5 ml 34.18 ml/m  AORTIC VALVE AV Area (Vmax):    2.75 cm AV Area (Vmean):   2.61 cm AV Area (VTI):     2.66 cm AV Vmax:           169.00 cm/s AV Vmean:          115.000 cm/s AV VTI:            0.347 m AV Peak Grad:      11.4 mmHg AV Mean Grad:  6.0 mmHg LVOT Vmax:         148.00 cm/s LVOT Vmean:        95.500 cm/s LVOT VTI:          0.294 m LVOT/AV VTI ratio: 0.85  AORTA Ao Root diam: 3.60 cm Ao Asc diam:  3.10 cm MITRAL VALVE MV Area (PHT): 3.12 cm     SHUNTS MV Area VTI:   2.72 cm     Systemic VTI:  0.29 m MV Peak grad:  5.9 mmHg     Systemic Diam: 2.00 cm MV Mean grad:  2.0 mmHg MV Vmax:       1.21  m/s MV Vmean:      56.7 cm/s MV Decel Time: 243 msec MV E velocity: 114.00 cm/s MV A velocity: 49.30 cm/s MV E/A ratio:  2.31 Armanda Magic MD Electronically signed by Armanda Magic MD Signature Date/Time: 01/03/2021/9:38:28 AM    Final       Subjective: Patient seen and examined.  Sitting comfortably in the bed.  No new complaints.  Reports he is feeling better.  No headache, blurry vision, lightheadedness, syncope, chest pain, shortness of breath, palpitation.  He walked with RN without any difficulty.  Discharge Exam: Vitals:   01/03/21 1000 01/03/21 1111  BP: 129/76 131/80  Pulse:  60  Resp:  18  Temp:  97.9 F (36.6 C)  SpO2:  100%   Vitals:   01/03/21 0735 01/03/21 0956 01/03/21 1000 01/03/21 1111  BP: 116/77 114/73 129/76 131/80  Pulse: (!) 52 64  60  Resp: 18 18  18   Temp: 97.9 F (36.6 C)   97.9 F (36.6 C)  TempSrc: Oral   Oral  SpO2: 97%   100%  Weight:      Height:        General: Pt is alert, awake, not in acute distress, on room air, communicating well. Cardiovascular: RRR, S1/S2 +, no rubs, no gallops Respiratory: CTA bilaterally, no wheezing, no rhonchi Abdominal: Soft, NT, ND, bowel sounds + Extremities: no edema, no cyanosis    The results of significant diagnostics from this hospitalization (including imaging, microbiology, ancillary and laboratory) are listed below for reference.     Microbiology: Recent Results (from the past 240 hour(s))  Resp Panel by RT-PCR (Flu A&B, Covid) Nasopharyngeal Swab     Status: None   Collection Time: 01/02/21  8:17 AM   Specimen: Nasopharyngeal Swab; Nasopharyngeal(NP) swabs in vial transport medium  Result Value Ref Range Status   SARS Coronavirus 2 by RT PCR NEGATIVE NEGATIVE Final    Comment: (NOTE) SARS-CoV-2 target nucleic acids are NOT DETECTED.  The SARS-CoV-2 RNA is generally detectable in upper respiratory specimens during the acute phase of infection. The lowest concentration of SARS-CoV-2 viral copies  this assay can detect is 138 copies/mL. A negative result does not preclude SARS-Cov-2 infection and should not be used as the sole basis for treatment or other patient management decisions. A negative result may occur with  improper specimen collection/handling, submission of specimen other than nasopharyngeal swab, presence of viral mutation(s) within the areas targeted by this assay, and inadequate number of viral copies(<138 copies/mL). A negative result must be combined with clinical observations, patient history, and epidemiological information. The expected result is Negative.  Fact Sheet for Patients:  03/04/21  Fact Sheet for Healthcare Providers:  BloggerCourse.com  This test is no t yet approved or cleared by the [place] FDA and  has been authorized for detection and/or diagnosis  of SARS-CoV-2 by FDA under an Emergency Use Authorization (EUA). This EUA will remain  in effect (meaning this test can be used) for the duration of the COVID-19 declaration under Section 564(b)(1) of the Act, 21 U.S.C.section 360bbb-3(b)(1), unless the authorization is terminated  or revoked sooner.       Influenza A by PCR NEGATIVE NEGATIVE Final   Influenza B by PCR NEGATIVE NEGATIVE Final    Comment: (NOTE) The Xpert Xpress SARS-CoV-2/FLU/RSV plus assay is intended as an aid in the diagnosis of influenza from Nasopharyngeal swab specimens and should not be used as a sole basis for treatment. Nasal washings and aspirates are unacceptable for Xpert Xpress SARS-CoV-2/FLU/RSV testing.  Fact Sheet for Patients: BloggerCourse.com  Fact Sheet for Healthcare Providers: SeriousBroker.it  This test is not yet approved or cleared by the [place] FDA and has been authorized for detection and/or diagnosis of SARS-CoV-2 by FDA under an Emergency Use Authorization (EUA). This EUA  will remain in effect (meaning this test can be used) for the duration of the COVID-19 declaration under Section 564(b)(1) of the Act, 21 U.S.C. section 360bbb-3(b)(1), unless the authorization is terminated or revoked.  Performed at [facility], [address], [place], [place] [address]      Labs: BNP (last 3 results) No results for input(s): BNP in the last 8760 hours. Basic Metabolic Panel: Recent Labs  Lab 01/02/21 0717 01/03/21 0401  NA 138 138  K 3.5 4.0  CL 106 106  CO2 22 25  GLUCOSE 145* 104*  BUN 16 11  CREATININE 1.14 1.14  CALCIUM 9.1 8.8*   Liver Function Tests: Recent Labs  Lab 01/02/21 0717  AST 27  ALT 34  ALKPHOS 43  BILITOT 1.3*  PROT 6.6  ALBUMIN 3.9   No results for input(s): LIPASE, AMYLASE in the last 168 hours. No results for input(s): AMMONIA in the last 168 hours. CBC: Recent Labs  Lab 01/02/21 0805  WBC 12.0*  NEUTROABS 9.9*  HGB 14.7  HCT 42.0  MCV 87.0  PLT 151   Cardiac Enzymes: Recent Labs  Lab 01/02/21 1312  CKTOTAL 179   BNP: Invalid input(s): POCBNP CBG: Recent Labs  Lab 01/02/21 0714 01/03/21 0509  GLUCAP 137* 109*   D-Dimer Recent Labs    01/02/21 0717  DDIMER 0.51*   Hgb A1c No results for input(s): HGBA1C in the last 72 hours. Lipid Profile No results for input(s): CHOL, HDL, LDLCALC, TRIG, CHOLHDL, LDLDIRECT in the last 72 hours. Thyroid function studies Recent Labs    01/03/21 0401  TSH 2.615   Anemia work up Recent Labs    01/03/21 0401  VITAMINB12 231  FOLATE 12.8   Urinalysis    Component Value Date/Time   COLORURINE YELLOW 01/02/2021 1031   APPEARANCEUR CLEAR 01/02/2021 1031   LABSPEC 1.012 01/02/2021 1031   PHURINE 8.0 01/02/2021 1031   GLUCOSEU NEGATIVE 01/02/2021 1031   HGBUR NEGATIVE 01/02/2021 1031   BILIRUBINUR NEGATIVE 01/02/2021 1031   BILIRUBINUR neg 07/03/2016 0829   KETONESUR 5 (A) 01/02/2021 1031   PROTEINUR NEGATIVE 01/02/2021 1031    UROBILINOGEN 1.0 07/03/2016 0829   NITRITE NEGATIVE 01/02/2021 1031   LEUKOCYTESUR NEGATIVE 01/02/2021 1031   Sepsis Labs Invalid input(s): PROCALCITONIN,  WBC,  LACTICIDVEN Microbiology Recent Results (from the past 240 hour(s))  Resp Panel by RT-PCR (Flu A&B, Covid) Nasopharyngeal Swab     Status: None   Collection Time: 01/02/21  8:17 AM   Specimen: Nasopharyngeal Swab; Nasopharyngeal(NP) swabs in  vial transport medium  Result Value Ref Range Status   SARS Coronavirus 2 by RT PCR NEGATIVE NEGATIVE Final    Comment: (NOTE) SARS-CoV-2 target nucleic acids are NOT DETECTED.  The SARS-CoV-2 RNA is generally detectable in upper respiratory specimens during the acute phase of infection. The lowest concentration of SARS-CoV-2 viral copies this assay can detect is 138 copies/mL. A negative result does not preclude SARS-Cov-2 infection and should not be used as the sole basis for treatment or other patient management decisions. A negative result may occur with  improper specimen collection/handling, submission of specimen other than nasopharyngeal swab, presence of viral mutation(s) within the areas targeted by this assay, and inadequate number of viral copies(<138 copies/mL). A negative result must be combined with clinical observations, patient history, and epidemiological information. The expected result is Negative.  Fact Sheet for Patients:  BloggerCourse.com  Fact Sheet for Healthcare Providers:  SeriousBroker.it  This test is no t yet approved or cleared by the [place] FDA and  has been authorized for detection and/or diagnosis of SARS-CoV-2 by FDA under an Emergency Use Authorization (EUA). This EUA will remain  in effect (meaning this test can be used) for the duration of the COVID-19 declaration under Section 564(b)(1) of the Act, 21 U.S.C.section 360bbb-3(b)(1), unless the authorization is terminated  or revoked  sooner.       Influenza A by PCR NEGATIVE NEGATIVE Final   Influenza B by PCR NEGATIVE NEGATIVE Final    Comment: (NOTE) The Xpert Xpress SARS-CoV-2/FLU/RSV plus assay is intended as an aid in the diagnosis of influenza from Nasopharyngeal swab specimens and should not be used as a sole basis for treatment. Nasal washings and aspirates are unacceptable for Xpert Xpress SARS-CoV-2/FLU/RSV testing.  Fact Sheet for Patients: BloggerCourse.com  Fact Sheet for Healthcare Providers: SeriousBroker.it  This test is not yet approved or cleared by the [place] FDA and has been authorized for detection and/or diagnosis of SARS-CoV-2 by FDA under an Emergency Use Authorization (EUA). This EUA will remain in effect (meaning this test can be used) for the duration of the COVID-19 declaration under Section 564(b)(1) of the Act, 21 U.S.C. section 360bbb-3(b)(1), unless the authorization is terminated or revoked.  Performed at [facility], [address], [place], [place] [address]      Time coordinating discharge: Over 30 minutes  SIGNED:   Ollen Bowl, MD  [facility] 01/03/2021, 2:25 PM Pager   If 7PM-7AM, please contact night-coverage [email or web address]

## 2021-01-03 NOTE — Progress Notes (Signed)
Patient ambulated in hall without assistance, denied dizziness, SOB or pain.  HR increased from 62 to 72 with ambulation.

## 2021-01-03 NOTE — Progress Notes (Signed)
PT Cancellation Note  Patient Details Name: Francisco White MRN: [id number] DOB: [birth year]   Cancelled Treatment:    Reason Eval/Treat Not Completed: PT screened, no needs identified, will sign off. Pt amb in hallway with nursing with no difficulty. No PT needs.    Angelina Ok [facility] 01/03/2021, 9:59 AM Skip Mayer PT Acute Rehabilitation Services Pager [phone] Office [phone]

## 2021-01-03 NOTE — Progress Notes (Signed)
PT Cancellation Note  Patient Details Name: Francisco White MRN: [id number] DOB: October 05, 1970   Cancelled Treatment:    Reason Eval/Treat Not Completed: Other (comment). Order received. Expect pt should be independent with mobility. Nurse is going to have pt ambulate and will let me know if there are any problems.    Angelina Ok [facility] 01/03/2021, 9:54 AM Skip Mayer PT Acute Rehabilitation Services Pager [phone] Office [phone]

## 2021-01-03 NOTE — Progress Notes (Signed)
  Echocardiogram 2D Echocardiogram has been performed.  Francisco White 01/03/2021, 9:29 AM

## 2021-01-03 NOTE — Progress Notes (Signed)
EEG complete - results pending 

## 2021-01-03 NOTE — Plan of Care (Signed)
POC reviewed with pt who verbalizes understanding. VSS, orthostatic vitals completed and negative this shift. Pt kept on falls precautions.  EEG department and tech called to follow up on order/starting monitoring with no success. Message left and department called again two more times, no response. Will endorse to dayshift in the morning.

## 2021-01-03 NOTE — Telephone Encounter (Signed)
Wife of patient called again this morning. She would like  a call from Dr. Tenny Craw this afternoon

## 2021-01-03 NOTE — Procedures (Signed)
Patient Name: Francisco White  MRN: [id number]  Epilepsy Attending: Charlsie Quest  Referring Physician/Provider: Dr Porfirio Oar Date: 01/03/2021 Duration: 22.58 mins  Patient history: 50 year old male with syncope versus seizure.  EEG to evaluate for seizures.  Level of alertness: Awake  AEDs during EEG study: None  Technical aspects: This EEG study was done with scalp electrodes positioned according to the 10-20 International system of electrode placement. Electrical activity was acquired at a sampling rate of 500Hz  and reviewed with a high frequency filter of 70Hz  and a low frequency filter of 1Hz . EEG data were recorded continuously and digitally stored.   Description: The posterior dominant rhythm consists of 9-10 Hz activity of moderate voltage (25-35 uV) seen predominantly in posterior head regions, symmetric and reactive to eye opening and eye closing. Hyperventilation and photic stimulation were not performed.     IMPRESSION: This study is within normal limits. No seizures or epileptiform discharges were seen throughout the recording.   Francisco White 

## 2021-01-06 NOTE — Telephone Encounter (Signed)
Patient denies dizziness    Will checdk sleep study Will also set for a 4 wk monitor   r/o arrhythma, pause Please order Calcium score CT    Pt wants it done

## 2021-01-06 NOTE — Telephone Encounter (Signed)
Orders placed for cardiac event monitor for syncope, sleep study and a calcium score ct.

## 2021-01-07 NOTE — Telephone Encounter (Signed)
Called patient. Around 1am last night, was asleep and woke due to electricity going out.  He got up and walked around the house to check on things and then walking back to bed the symptoms started.  They resolved on their own with him lying down.  Did not measure BP or HR.  Describes HR as feeling like it was going really slow.  The monitor, sleep study and calcium score ct were ordered yesterday.  Notes he was on losartan-hctz for 5-6 years prior to Dr. [place] changing it to olmesartan and hctz separately about 2 months ago.  He does not recall the reason for the change but he would like to go back to losarant-hctz because that was the only change medicine-wise that has occurred.    Pt aware I am forwarding to Dr. Tenny Craw to let her know and will call him back with any further recommendations.   I adv him to contact PCP so that she knows of this episode as well, in case there is a possible non cardiac reason for the episodes.  Pt in agreement with this plan.

## 2021-01-09 ENCOUNTER — Ambulatory Visit (INDEPENDENT_AMBULATORY_CARE_PROVIDER_SITE_OTHER): Payer: No Typology Code available for payment source

## 2021-01-09 DIAGNOSIS — R55 Syncope and collapse: Secondary | ICD-10-CM | POA: Diagnosis not present

## 2021-01-09 DIAGNOSIS — I2510 Atherosclerotic heart disease of native coronary artery without angina pectoris: Secondary | ICD-10-CM

## 2021-01-10 NOTE — Telephone Encounter (Signed)
Spoke to patient Wed.   Reviewed events   Discussed orthostatic intolerance and how to avoid injury if BP drops Monitor is ordered Pt initially wanted Ca calcium score.   THis would not be correct test given hx of stent He is not having any anginal symptoms (indigestion)   I would not recomm pursuing at present   Work at [facility] for risk factors. F/U testing based on monitor results

## 2021-01-10 NOTE — Telephone Encounter (Signed)
Calcium score ct has been cancelled.

## 2021-02-04 ENCOUNTER — Other Ambulatory Visit: Payer: No Typology Code available for payment source

## 2021-02-05 ENCOUNTER — Telehealth: Payer: Self-pay | Admitting: Pharmacist

## 2021-02-05 DIAGNOSIS — E785 Hyperlipidemia, unspecified: Secondary | ICD-10-CM

## 2021-02-05 NOTE — Telephone Encounter (Signed)
Called pt and left message to see how he's been tolerating lower dose of Praluent 75mg  and switch from atorvastatin 40mg  to rosuvastatin 20mg  daily on 6/1.  If tolerating well, will schedule labs.

## 2021-02-07 NOTE — Telephone Encounter (Signed)
Called pt again. He is currently in the hospital with his wife who had an aneurysm. He reports tolerating rosuvastatin and lower dose of Praluent well. Occasionally has a twinge in his shoulder but overall muscle pain has improved and he feels comfortable continuing on therapy. Scheduled f/u lipids to assess efficacy in Sept. Also states he's no longer taking olmesartan and instead is taking losartan. Med list has been updated.

## 2021-02-08 ENCOUNTER — Other Ambulatory Visit: Payer: Self-pay | Admitting: Family Medicine

## 2021-03-01 ENCOUNTER — Other Ambulatory Visit: Payer: Self-pay | Admitting: Family Medicine

## 2021-03-10 ENCOUNTER — Other Ambulatory Visit: Payer: Self-pay | Admitting: Family Medicine

## 2021-04-06 ENCOUNTER — Encounter: Payer: Self-pay | Admitting: Family Medicine

## 2021-04-09 ENCOUNTER — Other Ambulatory Visit: Payer: Self-pay

## 2021-04-09 ENCOUNTER — Other Ambulatory Visit: Payer: 59 | Admitting: *Deleted

## 2021-04-09 DIAGNOSIS — E785 Hyperlipidemia, unspecified: Secondary | ICD-10-CM

## 2021-04-09 LAB — LIPID PANEL
Chol/HDL Ratio: 2.1 ratio (ref 0.0–5.0)
Cholesterol, Total: 100 mg/dL (ref 100–199)
HDL: 47 mg/dL (ref >39)
LDL Chol Calc (NIH): 31 mg/dL (ref 0–99)
Triglycerides: 127 mg/dL (ref 0–149)
VLDL Cholesterol Cal: 22 mg/dL (ref 5–40)

## 2021-04-09 LAB — HEPATIC FUNCTION PANEL
ALT: 26 IU/L (ref 0–44)
AST: 23 IU/L (ref 0–40)
Albumin: 4.7 g/dL (ref 3.8–4.9)
Alkaline Phosphatase: 60 IU/L (ref 44–121)
Bilirubin Total: 0.7 mg/dL (ref 0.0–1.2)
Bilirubin, Direct: 0.21 mg/dL (ref 0.00–0.40)
Total Protein: 6.5 g/dL (ref 6.0–8.5)

## 2021-04-10 ENCOUNTER — Other Ambulatory Visit: Payer: Self-pay | Admitting: Family Medicine

## 2021-04-10 DIAGNOSIS — I10 Essential (primary) hypertension: Secondary | ICD-10-CM

## 2021-04-14 ENCOUNTER — Other Ambulatory Visit: Payer: Self-pay

## 2021-04-14 ENCOUNTER — Encounter: Payer: Self-pay | Admitting: Family Medicine

## 2021-04-14 ENCOUNTER — Ambulatory Visit (INDEPENDENT_AMBULATORY_CARE_PROVIDER_SITE_OTHER): Payer: 59 | Admitting: Family Medicine

## 2021-04-14 VITALS — BP 128/80 | HR 64 | Resp 16 | Ht 74.0 in | Wt 233.2 lb

## 2021-04-14 DIAGNOSIS — F325 Major depressive disorder, single episode, in full remission: Secondary | ICD-10-CM

## 2021-04-14 DIAGNOSIS — F4321 Adjustment disorder with depressed mood: Secondary | ICD-10-CM | POA: Diagnosis not present

## 2021-04-14 DIAGNOSIS — F3341 Major depressive disorder, recurrent, in partial remission: Secondary | ICD-10-CM | POA: Diagnosis not present

## 2021-04-14 DIAGNOSIS — N5089 Other specified disorders of the male genital organs: Secondary | ICD-10-CM

## 2021-04-14 MED ORDER — ALPRAZOLAM 0.25 MG PO TABS: 0.2500 mg | ORAL_TABLET | Freq: Two times a day (BID) | ORAL | 0 refills | Status: DC | PRN

## 2021-04-14 NOTE — Progress Notes (Signed)
ACUTE VISIT Chief Complaint  Patient presents with   lump   HPI: Francisco White is a 50 y.o. male with hx of CAD,depression,anxiety,HTN,and HLD here today concerned about lump in right testicle x 2,noted a week ago.  Male GU Problem The patient's pertinent negatives include no genital injury, genital lesions, pelvic pain, penile discharge, penile pain, scrotal swelling or testicular pain. The problem has been unchanged. The patient is experiencing no pain. Pertinent negatives include no abdominal pain, chest pain, chills, fever, flank pain, frequency, hematuria, nausea, rash, shortness of breath, urgency or urinary retention. Nothing aggravates the symptoms. He has tried nothing for the symptoms. He is not sexually active. There is no history of BPH, an inguinal hernia, kidney stones or prostatitis.  No associated testicular pain,scrotal edema or erythema.  He was seen in 08/2018 for scrotal pain, testicular US done at that time was negative. Pain resolved.  His wife passed away unexpectedly in 03/05/2021, he is living with his 2 teens daughter and wants to be sure that testicular masses are not caused by a serious process.  He requesting something to help with acute anxiety.He feels like he cannot function sometimes, exacerbated by his wife memories. He is on Sertraline 100 mg daily. Sleeping about 7 hours. He was last seen on 09/11/20.  Review of Systems  Constitutional:  Positive for fatigue. Negative for chills and fever.  Respiratory:  Negative for shortness of breath.   Cardiovascular:  Negative for chest pain and palpitations.  Gastrointestinal:  Negative for abdominal pain and nausea.  Endocrine: Negative for cold intolerance, heat intolerance, polydipsia, polyphagia and polyuria.  Genitourinary:  Negative for flank pain, frequency, pelvic pain, penile discharge, penile pain, scrotal swelling, testicular pain and urgency.  Skin:  Negative for rash.  Hematological:  Negative  for adenopathy. Does not bruise/bleed easily.  Psychiatric/Behavioral:  Negative for confusion. The patient is nervous/anxious.   Rest see pertinent positives and negatives per HPI.  Current Outpatient Medications on File Prior to Visit  Medication Sig Dispense Refill   Alirocumab (PRALUENT) 75 MG/ML SOAJ Inject 1 pen into the skin every 14 (fourteen) days. 2 mL 11   aspirin 81 MG tablet Take 81 mg by mouth daily.     atorvastatin (LIPITOR) 80 MG tablet Take 1 tablet by mouth daily. 30 tablet 5   hydrochlorothiazide (HYDRODIURIL) 12.5 MG tablet Take 1 tablet by mouth once daily. 90 tablet 2   losartan (COZAAR) 100 MG tablet Take 1 tablet by mouth daily. 90 tablet 0   rosuvastatin (CRESTOR) 20 MG tablet Take 1 tablet (20 mg total) by mouth daily. 90 tablet 3   sertraline (ZOLOFT) 100 MG tablet Take 1 tablet by mouth daily. 90 tablet 3   No current facility-administered medications on file prior to visit.   Past Medical History:  Diagnosis Date   CAD (coronary artery disease)    Depression 03/26/2013   Family history of heart disease    History of nuclear stress test 06/2011   exercise; negative for ischemia, normal pattern of perfusion    HTN (hypertension) 03/26/2013   Hyperlipidemia    Hypertension    Skin sensation disturbance 04/07/2017   Umbilical hernia 03/26/2013   Allergies  Allergen Reactions   Simvastatin Other (See Comments)    Myalgias     Social History   Socioeconomic History   Marital status: Married    Spouse name: Not on file   Number of children: 3   Years of education: Not  on file   Highest education level: Not on file  Occupational History   Not on file  Tobacco Use   Smoking status: Never   Smokeless tobacco: Never  Vaping Use   Vaping Use: Never used  Substance and Sexual Activity   Alcohol use: Yes    Comment: occasional    Drug use: No   Sexual activity: Not on file  Other Topics Concern   Not on file  Social History Narrative   Not on  file   Social Determinants of Health   Financial Resource Strain: Not on file  Food Insecurity: Not on file  Transportation Needs: Not on file  Physical Activity: Not on file  Stress: Not on file  Social Connections: Not on file   Vitals:   04/14/21 0722  BP: 128/80  Pulse: 64  Resp: 16  SpO2: 98%   Body mass index is 29.95 kg/m.  Physical Exam Vitals and nursing note reviewed. Exam conducted with a chaperone present.  Constitutional:      General: He is not in acute distress.    Appearance: He is well-developed.  HENT:     Head: Normocephalic and atraumatic.  Eyes:     Conjunctiva/sclera: Conjunctivae normal.  Cardiovascular:     Rate and Rhythm: Normal rate and regular rhythm.     Heart sounds: No murmur heard. Pulmonary:     Effort: Pulmonary effort is normal. No respiratory distress.     Breath sounds: Normal breath sounds.  Abdominal:     Palpations: Abdomen is soft. There is no hepatomegaly or mass.     Tenderness: There is no abdominal tenderness.     Hernia: There is no hernia in the left inguinal area or right inguinal area.  Genitourinary:    Penis: No tenderness, swelling or lesions.      Testes:        Right: Tenderness not present.        Left: Tenderness not present.     Epididymis:     Right: Mass present.     Left: Mass present.    Lymphadenopathy:     Cervical: No cervical adenopathy.     Lower Body: No right inguinal adenopathy. No left inguinal adenopathy.  Skin:    General: Skin is warm.     Findings: No erythema or rash.  Neurological:     Mental Status: He is alert and oriented to person, place, and time.     Cranial Nerves: No cranial nerve deficit.     Gait: Gait normal.  Psychiatric:        Mood and Affect: Affect is labile.     Comments: Well groomed, good eye contact.    ASSESSMENT AND PLAN:  Francisco White was seen today for lump.  Diagnoses and all orders for this visit:  Testicular mass We discussed possible  etiologies. Hx and examination do not suggest a serious process. Testicular US will be arranged.  Recurrent major depressive disorder, in partial remission (HCC) Exacerbated by recent events. Continue Sertraline 100 mg daily.  Grief Attending group counseling. After discussion of some side effects he agrees with trying Xanax low dose bid prn for acute episodes of anxiety.He will let me know if medication is helping with symptoms. Continue Sertraline same dose.  -     ALPRAZolam (XANAX) 0.25 MG tablet; Take 1 tablet (0.25 mg total) by mouth 2 (two) times daily as needed for anxiety.   Return in about 22 weeks (around 09/15/2021) for cpe.  Francisco Giuffre G. [place], MD  [facility]. [place] office.

## 2021-04-14 NOTE — Patient Instructions (Signed)
A few things to remember from today's visit:   Grief - Plan: ALPRAZolam (XANAX) 0.25 MG tablet  Testicular mass - Plan: US Scrotum  If you need refills please call your pharmacy. Do not use My Chart to request refills or for acute issues that need immediate attention.   Xanax 2 times daily as needed. Please let me know if it is helping. I will see you in 08/2021 for your CPE.  Please be sure medication list is accurate. If a new problem present, please set up appointment sooner than planned today.

## 2021-04-15 ENCOUNTER — Telehealth: Payer: Self-pay | Admitting: Family Medicine

## 2021-04-15 DIAGNOSIS — N5089 Other specified disorders of the male genital organs: Secondary | ICD-10-CM

## 2021-04-15 NOTE — Telephone Encounter (Signed)
Order corrected & re-entered.

## 2021-04-15 NOTE — Telephone Encounter (Signed)
[facility] called with a question in regards to a order that was sent over for the PT. The question is:  Is this done with or without doplar ( might of spelt wrong )  Please call her at [phone] to answer the question

## 2021-04-19 ENCOUNTER — Encounter: Payer: Self-pay | Admitting: Family Medicine

## 2021-05-15 ENCOUNTER — Other Ambulatory Visit: Payer: Self-pay | Admitting: Family Medicine

## 2021-05-15 DIAGNOSIS — F4321 Adjustment disorder with depressed mood: Secondary | ICD-10-CM

## 2021-05-17 MED ORDER — ALPRAZOLAM 0.25 MG PO TABS: 0.2500 mg | ORAL_TABLET | Freq: Every evening | ORAL | 0 refills | Status: DC | PRN

## 2021-07-07 ENCOUNTER — Telehealth: Payer: Self-pay

## 2021-07-07 NOTE — Telephone Encounter (Signed)
Letter has been sent to patient informing them that their sleep study has expired. Patient will need to call and schedule an office visit to re-evaluate the need for a sleep study.    

## 2021-09-04 ENCOUNTER — Telehealth: Payer: Self-pay

## 2021-09-04 NOTE — Telephone Encounter (Signed)
Called and spoke to pt stating that I was working on a pa for the praluent when he stated that he isn't sure he wants to resume it and advised me that he will call back when ready. I voiced understanding. I also advised him that he is overdue to see md  and he wasn't ready to schedule that either.

## 2021-09-29 ENCOUNTER — Other Ambulatory Visit: Payer: Self-pay

## 2021-09-29 MED ORDER — ROSUVASTATIN CALCIUM 20 MG PO TABS: 20.0000 mg | ORAL_TABLET | Freq: Every day | ORAL | 0 refills | Status: DC

## 2021-09-29 NOTE — Telephone Encounter (Signed)
Pt's medication was sent to pt's pharmacy as requested. Confirmation received.  °

## 2021-09-30 ENCOUNTER — Other Ambulatory Visit: Payer: Self-pay | Admitting: Family Medicine

## 2021-09-30 DIAGNOSIS — F325 Major depressive disorder, single episode, in full remission: Secondary | ICD-10-CM

## 2021-11-18 ENCOUNTER — Other Ambulatory Visit: Payer: Self-pay | Admitting: Family Medicine

## 2021-11-18 ENCOUNTER — Other Ambulatory Visit: Payer: Self-pay | Admitting: Internal Medicine

## 2021-11-18 DIAGNOSIS — I10 Essential (primary) hypertension: Secondary | ICD-10-CM

## 2021-11-19 ENCOUNTER — Encounter: Payer: Self-pay | Admitting: Family Medicine

## 2021-11-19 DIAGNOSIS — I10 Essential (primary) hypertension: Secondary | ICD-10-CM

## 2021-11-19 MED ORDER — HYDROCHLOROTHIAZIDE 12.5 MG PO TABS: 12.5000 mg | ORAL_TABLET | Freq: Every day | ORAL | 1 refills | Status: DC

## 2021-12-15 ENCOUNTER — Encounter: Payer: Self-pay | Admitting: Family Medicine

## 2022-01-12 NOTE — Progress Notes (Unsigned)
Cardiology Office Note   Date:  01/14/2022   ID:  KARMAN VENEY, DOB 1971-05-22, MRN [id number]  PCP:  [place], Betty G, MD  Cardiologist:   Dietrich Pates, MD   Pt presents for f/u of CAD     History of Present Illness: Francisco White is a 51 y.o. male with a history of CAD (stent to LAD in 2010), HTN, HL, anxiety IN June 2022 the pt had an episode of syncope   He had a hx of occasional dizziness with standing    On am of admission he woke up   Tried to get out of bed   Collapsed on floor.     Got up   [place] again.   In BR blacked out   Hit head   Confused and nauseated on awakening   Pt denied lightheadedness or palpitations   On EMS arriaval pt had HR in 40s   Note pt had switched from Repatha to Praluent 2 wks prior.  CT of head done   Negative   Since seen he says he is feeling OK   He denies CP   Breathing is OK   No dizziness    Pt had echo and an event monitor that were unremarkable     Since seen he has had no further spells     Breathing is OK No CP      His wife died last summer and he is now raising 2 teenage daughters by himself   Would like cardiac evaluation to make sure he is OK      Current Meds  Medication Sig   Alirocumab (PRALUENT) 75 MG/ML SOAJ Inject 1 pen into the skin every 14 (fourteen) days.   aspirin 81 MG tablet Take 81 mg by mouth daily.   atorvastatin (LIPITOR) 80 MG tablet Take 1 tablet by mouth daily.   hydrochlorothiazide (HYDRODIURIL) 12.5 MG tablet Take 1 tablet (12.5 mg total) by mouth daily.   losartan (COZAAR) 100 MG tablet Take 1 tablet by mouth daily.   rosuvastatin (CRESTOR) 20 MG tablet Take 1 tablet (20 mg total) by mouth daily. Please make overdue appt with Dr. Tenny Craw before anymore refills. Thank you 1st attempt   sertraline (ZOLOFT) 100 MG tablet Take 1 tablet by mouth daily.     Allergies:   Simvastatin   Past Medical History:  Diagnosis Date   CAD (coronary artery disease)    Depression 03/26/2013   Family history of heart disease     History of nuclear stress test 06/2011   exercise; negative for ischemia, normal pattern of perfusion    HTN (hypertension) 03/26/2013   Hyperlipidemia    Hypertension    Skin sensation disturbance 04/07/2017   Umbilical hernia 03/26/2013    Past Surgical History:  Procedure Laterality Date   APPENDECTOMY  1977   CORONARY ANGIOPLASTY WITH STENT PLACEMENT  10/2008   xience 3.5x61mm stent to LAD ([place])   HERNIA REPAIR  1996   TRANSTHORACIC ECHOCARDIOGRAM  2010   EF ~65%; borderline LA enlargement      Social History:  The patient  reports that he has never smoked. He has never used smokeless tobacco. He reports current alcohol use. He reports that he does not use drugs.   Family History:  The patient's family history includes Depression in his mother; Heart disease in his father, paternal grandfather, and paternal grandmother.    ROS:  Please see the history of present illness. All other systems  are reviewed and  Negative to the above problem except as noted.    PHYSICAL EXAM: VS:  BP 104/68   Pulse (!) 55   Ht 6\' 2"  (1.88 m)   Wt 244 lb 6.4 oz (110.9 kg)   SpO2 97%   BMI 31.38 kg/m   GEN: Well nourished, well developed, in no acute distress  HEENT: normal  Neck: no JVD, carotid bruits Cardiac: RRR; no murmurs  No  LE edema  Respiratory:  clear to auscultation bilaterally, GI: soft, nontender, nondistended, + BS  No hepatomegaly  MS: no deformity Moving all extremities   Skin: warm and dry, no rash Neuro:  Strength and sensation are intact Psych: euthymic mood, full affect   EKG:  EKG is ordered today.  Sinus bradycardia  55 bpm  Possible septal MI     Echo 12/2020  1. Left ventricular ejection fraction, by estimation, is 60 to 65%. The left ventricle has normal function. The left ventricle has no regional wall motion abnormalities. There is mild concentric left ventricular hypertrophy. Left ventricular diastolic parameters were normal. 2. Right  ventricular systolic function is normal. The right ventricular size is mildly enlarged. Tricuspid regurgitation signal is inadequate for assessing PA pressure. 3. The mitral valve is normal in structure. No evidence of mitral valve regurgitation. No evidence of mitral stenosis. 4. The aortic valve was not well visualized. Aortic valve regurgitation is not visualized. Mild aortic valve sclerosis is present, with no evidence of aortic valve stenosis. 5. The inferior vena cava is normal in size with greater than 50% respiratory variability, suggesting right atrial pressure of 3 mmHg.   Monitor   02/11/21  Sinus rhythm with occasional PACs, PVCs and short (limted ) PAT No significant arrhythmias Triggered events (palpitations) correlated with SR or SR with PACs or short burst  Lipid Panel    Component Value Date/Time   CHOL 100 04/09/2021 0840   TRIG 127 04/09/2021 0840   HDL 47 04/09/2021 0840   CHOLHDL 2.1 04/09/2021 0840   CHOLHDL 5 09/02/2018 0759   VLDL 24.2 09/02/2018 0759   LDLCALC 31 04/09/2021 0840      Wt Readings from Last 3 Encounters:  01/14/22 244 lb 6.4 oz (110.9 kg)  04/14/21 233 lb 4 oz (105.8 kg)  01/03/21 241 lb 9.6 oz (109.6 kg)      ASSESSMENT AND PLAN:  1  CAD   Pt with remote intervention   Doing well   Pt is very anxious about CAD and risks   Parenting alone   Would like to get study to rule out ischemia   Will investigate    2  Hx syncope  One day last summer   No recurrence.  Follow   Stay hydrated   3  HTN  BP is  very well controlled  Denies dizziness  4  HL  Need to get lipids     Will check on pricing     Tentative f/u next year   Current medicines are reviewed at length with the patient today.  The patient does not have concerns regarding medicines.  Signed, 03/05/21, MD  01/14/2022 7:35 PM    [facility] [address] [place], [place], [place]  [place] Phone: [phone]; Fax: [phone]

## 2022-01-14 ENCOUNTER — Ambulatory Visit (INDEPENDENT_AMBULATORY_CARE_PROVIDER_SITE_OTHER): Payer: 59 | Admitting: Internal Medicine

## 2022-01-14 ENCOUNTER — Encounter: Payer: Self-pay | Admitting: Internal Medicine

## 2022-01-14 VITALS — BP 104/68 | HR 55 | Ht 74.0 in | Wt 244.4 lb

## 2022-01-14 DIAGNOSIS — I2510 Atherosclerotic heart disease of native coronary artery without angina pectoris: Secondary | ICD-10-CM

## 2022-01-14 NOTE — Patient Instructions (Signed)
Medication Instructions:    Your physician recommends that you continue on your current medications as directed. Please refer to the Current Medication list given to you today.   *If you need a refill on your cardiac medications before your next appointment, please call your pharmacy*   Lab Work: WILL CONTACT BACK FOR LABS AT AFFORDABLE PLACE   If you have labs (blood work) drawn today and your tests are completely normal, you will receive your results only by: MyChart Message (if you have MyChart) OR A paper copy in the mail If you have any lab test that is abnormal or we need to change your treatment, we will call you to review the results.   Testing/Procedures: WILL CONTACT BACK FOR AFFORDABLE STRESS TEST OPTIONS     Follow-Up: At [facility], you and your health needs are our priority.  As part of our continuing mission to provide you with exceptional heart care, we have created designated Provider Care Teams.  These Care Teams include your primary Cardiologist (physician) and Advanced Practice Providers (APPs -  Physician Assistants and Nurse Practitioners) who all work together to provide you with the care you need, when you need it.  We recommend signing up for the patient portal called "MyChart".  Sign up information is provided on this After Visit Summary.  MyChart is used to connect with patients for Virtual Visits (Telemedicine).  Patients are able to view lab/test results, encounter notes, upcoming appointments, etc.  Non-urgent messages can be sent to your provider as well.   To learn more about what you can do with MyChart, go to [email or web address].    Your next appointment:  TO BE DETERMINED    If primary card or EP is not listed click here to update    :1}      Important Information About Sugar

## 2022-01-21 ENCOUNTER — Encounter: Payer: Self-pay | Admitting: Internal Medicine

## 2022-01-21 DIAGNOSIS — I2510 Atherosclerotic heart disease of native coronary artery without angina pectoris: Secondary | ICD-10-CM

## 2022-01-21 DIAGNOSIS — E785 Hyperlipidemia, unspecified: Secondary | ICD-10-CM

## 2022-01-21 DIAGNOSIS — Z79899 Other long term (current) drug therapy: Secondary | ICD-10-CM

## 2022-02-20 ENCOUNTER — Other Ambulatory Visit: Payer: Self-pay | Admitting: Family Medicine

## 2022-02-24 MED ORDER — ROSUVASTATIN CALCIUM 40 MG PO TABS: 40.0000 mg | ORAL_TABLET | Freq: Every day | ORAL | 3 refills | Status: DC

## 2022-02-24 NOTE — Telephone Encounter (Signed)
Pt should increaase Crestor to 40 mg    Check lipomed in 8 wks     Please check on MD reading of stress test

## 2022-03-12 ENCOUNTER — Other Ambulatory Visit: Payer: Self-pay | Admitting: Family Medicine

## 2022-03-12 ENCOUNTER — Other Ambulatory Visit: Payer: Self-pay | Admitting: Internal Medicine

## 2022-03-12 DIAGNOSIS — F325 Major depressive disorder, single episode, in full remission: Secondary | ICD-10-CM

## 2022-05-08 ENCOUNTER — Ambulatory Visit: Payer: Self-pay

## 2022-05-12 ENCOUNTER — Ambulatory Visit (INDEPENDENT_AMBULATORY_CARE_PROVIDER_SITE_OTHER): Payer: 59

## 2022-05-12 DIAGNOSIS — Z23 Encounter for immunization: Secondary | ICD-10-CM | POA: Diagnosis not present

## 2022-05-22 ENCOUNTER — Other Ambulatory Visit: Payer: Self-pay | Admitting: Family Medicine

## 2022-07-08 ENCOUNTER — Other Ambulatory Visit: Payer: Self-pay | Admitting: Internal Medicine

## 2022-07-08 DIAGNOSIS — I10 Essential (primary) hypertension: Secondary | ICD-10-CM

## 2022-07-13 ENCOUNTER — Encounter: Payer: Self-pay | Admitting: Family Medicine

## 2022-07-17 ENCOUNTER — Telehealth: Payer: Self-pay | Admitting: Internal Medicine

## 2022-07-17 NOTE — Telephone Encounter (Signed)
Patient called and would like to transfer from Dr. Tenny Craw to Dr. Royann Shivers. Please confirm transfer

## 2022-08-01 ENCOUNTER — Other Ambulatory Visit: Payer: Self-pay | Admitting: Family Medicine

## 2022-08-01 DIAGNOSIS — F325 Major depressive disorder, single episode, in full remission: Secondary | ICD-10-CM

## 2022-08-28 ENCOUNTER — Other Ambulatory Visit: Payer: Self-pay | Admitting: Family Medicine

## 2022-08-28 DIAGNOSIS — F325 Major depressive disorder, single episode, in full remission: Secondary | ICD-10-CM

## 2022-09-08 ENCOUNTER — Other Ambulatory Visit: Payer: Self-pay | Admitting: Internal Medicine

## 2022-09-18 ENCOUNTER — Other Ambulatory Visit: Payer: Self-pay | Admitting: Family Medicine

## 2022-09-18 DIAGNOSIS — I10 Essential (primary) hypertension: Secondary | ICD-10-CM

## 2022-09-22 ENCOUNTER — Other Ambulatory Visit: Payer: Self-pay | Admitting: Family Medicine

## 2022-09-22 DIAGNOSIS — I10 Essential (primary) hypertension: Secondary | ICD-10-CM

## 2022-09-22 DIAGNOSIS — F325 Major depressive disorder, single episode, in full remission: Secondary | ICD-10-CM

## 2022-09-23 ENCOUNTER — Ambulatory Visit (INDEPENDENT_AMBULATORY_CARE_PROVIDER_SITE_OTHER): Payer: 59 | Admitting: Family Medicine

## 2022-09-23 ENCOUNTER — Encounter: Payer: Self-pay | Admitting: Family Medicine

## 2022-09-23 VITALS — BP 100/72 | HR 64 | Temp 98.5°F | Resp 12 | Ht 72.75 in | Wt 206.2 lb

## 2022-09-23 DIAGNOSIS — Z125 Encounter for screening for malignant neoplasm of prostate: Secondary | ICD-10-CM | POA: Diagnosis not present

## 2022-09-23 DIAGNOSIS — Z0000 Encounter for general adult medical examination without abnormal findings: Secondary | ICD-10-CM

## 2022-09-23 DIAGNOSIS — E538 Deficiency of other specified B group vitamins: Secondary | ICD-10-CM | POA: Diagnosis not present

## 2022-09-23 DIAGNOSIS — I2510 Atherosclerotic heart disease of native coronary artery without angina pectoris: Secondary | ICD-10-CM

## 2022-09-23 DIAGNOSIS — Z23 Encounter for immunization: Secondary | ICD-10-CM

## 2022-09-23 DIAGNOSIS — E785 Hyperlipidemia, unspecified: Secondary | ICD-10-CM

## 2022-09-23 DIAGNOSIS — I10 Essential (primary) hypertension: Secondary | ICD-10-CM | POA: Diagnosis not present

## 2022-09-23 DIAGNOSIS — F3341 Major depressive disorder, recurrent, in partial remission: Secondary | ICD-10-CM

## 2022-09-23 DIAGNOSIS — Z1211 Encounter for screening for malignant neoplasm of colon: Secondary | ICD-10-CM

## 2022-09-23 DIAGNOSIS — Z131 Encounter for screening for diabetes mellitus: Secondary | ICD-10-CM

## 2022-09-23 MED ORDER — SERTRALINE HCL 100 MG PO TABS: 100.0000 mg | ORAL_TABLET | Freq: Every day | ORAL | 2 refills | Status: DC

## 2022-09-23 MED ORDER — HYDROCHLOROTHIAZIDE 12.5 MG PO TABS: 12.5000 mg | ORAL_TABLET | Freq: Every day | ORAL | 2 refills | Status: DC

## 2022-09-23 MED ORDER — LOSARTAN POTASSIUM 100 MG PO TABS: 100.0000 mg | ORAL_TABLET | Freq: Every day | ORAL | 2 refills | Status: DC

## 2022-09-23 NOTE — Patient Instructions (Addendum)
A few things to remember from today's visit:  Routine general medical examination at a health care facility  Primary hypertension - Plan: Comprehensive metabolic panel  Recurrent major depressive disorder, in partial remission ([place])  Need for shingles vaccine - Plan: Zoster Recombinant (Shingrix )  B12 deficiency - Plan: Vitamin B12  Coronary artery disease involving native coronary artery of native heart without angina pectoris - Plan: Lipid panel  Prostate cancer screening - Plan: PSA  Colon cancer screening - Plan: Cologuard  If you need refills for medications you take chronically, please call your pharmacy. Do not use My Chart to request refills or for acute issues that need immediate attention. If you send a my chart message, it may take a few days to be addressed, specially if I am not in the office.  Please be sure medication list is accurate. If a new problem present, please set up appointment sooner than planned today.  Health Maintenance, Male Adopting a healthy lifestyle and getting preventive care are important in promoting health and wellness. Ask your health care provider about: The right schedule for you to have regular tests and exams. Things you can do on your own to prevent diseases and keep yourself healthy. What should I know about diet, weight, and exercise? Eat a healthy diet  Eat a diet that includes plenty of vegetables, fruits, low-fat dairy products, and lean protein. Do not eat a lot of foods that are high in solid fats, added sugars, or sodium. Maintain a healthy weight Body mass index (BMI) is a measurement that can be used to identify possible weight problems. It estimates body fat based on height and weight. Your health care provider can help determine your BMI and help you achieve or maintain a healthy weight. Get regular exercise Get regular exercise. This is one of the most important things you can do for your health. Most adults  should: Exercise for at least 150 minutes each week. The exercise should increase your heart rate and make you sweat (moderate-intensity exercise). Do strengthening exercises at least twice a week. This is in addition to the moderate-intensity exercise. Spend less time sitting. Even light physical activity can be beneficial. Watch cholesterol and blood lipids Have your blood tested for lipids and cholesterol at 54 years of age, then have this test every 5 years. You may need to have your cholesterol levels checked more often if: Your lipid or cholesterol levels are high. You are older than 54 years of age. You are at high risk for heart disease. What should I know about cancer screening? Many types of cancers can be detected early and may often be prevented. Depending on your health history and family history, you may need to have cancer screening at various ages. This may include screening for: Colorectal cancer. Prostate cancer. Skin cancer. Lung cancer. What should I know about heart disease, diabetes, and high blood pressure? Blood pressure and heart disease High blood pressure causes heart disease and increases the risk of stroke. This is more likely to develop in people who have high blood pressure readings or are overweight. Talk with your health care provider about your target blood pressure readings. Have your blood pressure checked: Every 3-5 years if you are 40-63 years of age. Every year if you are 78 years old or older. If you are between the ages of 58 and 48 and are a current or former smoker, ask your health care provider if you should have a one-time screening for abdominal  aortic aneurysm (AAA). Diabetes Have regular diabetes screenings. This checks your fasting blood sugar level. Have the screening done: Once every three years after age 41 if you are at a normal weight and have a low risk for diabetes. More often and at a younger age if you are overweight or have a high  risk for diabetes. What should I know about preventing infection? Hepatitis B If you have a higher risk for hepatitis B, you should be screened for this virus. Talk with your health care provider to find out if you are at risk for hepatitis B infection. Hepatitis C Blood testing is recommended for: Everyone born from [birth year] through 1967. Anyone with known risk factors for hepatitis C. Sexually transmitted infections (STIs) You should be screened each year for STIs, including gonorrhea and chlamydia, if: You are sexually active and are younger than 54 years of age. You are older than 54 years of age and your health care provider tells you that you are at risk for this type of infection. Your sexual activity has changed since you were last screened, and you are at increased risk for chlamydia or gonorrhea. Ask your health care provider if you are at risk. Ask your health care provider about whether you are at high risk for HIV. Your health care provider may recommend a prescription medicine to help prevent HIV infection. If you choose to take medicine to prevent HIV, you should first get tested for HIV. You should then be tested every 3 months for as long as you are taking the medicine. Follow these instructions at home: Alcohol use Do not drink alcohol if your health care provider tells you not to drink. If you drink alcohol: Limit how much you have to 0-2 drinks a day. Know how much alcohol is in your drink. In the [place] one drink equals one 12 oz bottle of beer (355 mL), one 5 oz glass of wine (148 mL), or one 1 oz glass of hard liquor (44 mL). Lifestyle Do not use any products that contain nicotine or tobacco. These products include cigarettes, chewing tobacco, and vaping devices, such as e-cigarettes. If you need help quitting, ask your health care provider. Do not use street drugs. Do not share needles. Ask your health care provider for help if you need support or information about  quitting drugs. General instructions Schedule regular health, dental, and eye exams. Stay current with your vaccines. Tell your health care provider if: You often feel depressed. You have ever been abused or do not feel safe at home. Summary Adopting a healthy lifestyle and getting preventive care are important in promoting health and wellness. Follow your health care provider's instructions about healthy diet, exercising, and getting tested or screened for diseases. Follow your health care provider's instructions on monitoring your cholesterol and blood pressure. This information is not intended to replace advice given to you by your health care provider. Make sure you discuss any questions you have with your health care provider. Document Revised: 12/02/2020 Document Reviewed: 12/02/2020 Elsevier Patient Education  [place].

## 2022-09-23 NOTE — Progress Notes (Signed)
HPI: Francisco White is a 52 y.o.male with PMHx significant for hypertension, hyperlipidemia, B12 deficiency, anxiety, CAD, and hyperlipidemia here today for his routine physical examination.  Last CPE: 09/11/20 He was last seen for follow up on 04/14/2021.  He exercises regularly, engaging in weightlifting and cardiovascular activities on a treadmill x 10 min. He exercises four times per week. He has lost wt. His diet consists of home-cooked meals, daily vegetables, and primarily chicken and fish with occasional red meat or pork.  He sleeps an average of eight to eight and a half hours per night.  He does not smoke and has not consumed alcohol since July 2022.  Immunization History  Administered Date(s) Administered   COVID-19, mRNA, vaccine(Comirnaty)12 years and older 05/12/2022   Influenza,inj,Quad PF,6+ Mos 04/07/2017, 07/18/2018, 05/12/2022   Influenza-Unspecified 04/18/2016   Tdap 07/10/2016   Health Maintenance  Topic Date Due   Zoster Vaccines- Shingrix (1 of 2) Never done   COVID-19 Vaccine (2 - Pfizer risk series) 10/09/2022 (Originally 06/02/2022)   DTaP/Tdap/Td (2 - Td or Tdap) 07/10/2026   COLONOSCOPY (Pts 45-65yr Insurance coverage will need to be confirmed)  02/24/2030   INFLUENZA VACCINE  Completed   Hepatitis C Screening  Completed   HIV Screening  Completed   HPV VACCINES  Aged Out   Last prostate ca screening: Never. Nocturia 1-2 per night, stable for years.  CAD: He follows with cardiologist, Dr. RHarrington Challenger last seen on 01/14/2022. He ahs been trying to get coronary CTA ordered, he is concerned about progression of CAD. States that when I had it done almost 15 years ago,coronary stenosis was measured and wanted to see if it was increasing.He is going to pay test.He denies having any symptoms, but  just "wanted peace of mind" that it was not getting worse. He is not taking Praluent, it caused muscle aches. Currently on Aspirin 81 mg daily and rosuvastatin 40  mg daily.  Lab Results  Component Value Date   CHOL 100 04/09/2021   HDL 47 04/09/2021   LDLCALC 31 04/09/2021   TRIG 127 04/09/2021   CHOLHDL 2.1 04/09/2021   Hypertension on HCTZ 12.5 mg daily and losartan 100 mg daily. Negative for severe/frequent headache, visual changes, chest pain, dyspnea, palpitation,orthopnea, or PND.  Lab Results  Component Value Date   CREATININE 1.14 01/03/2021   BUN 11 01/03/2021   NA 138 01/03/2021   K 4.0 01/03/2021   CL 106 01/03/2021   CO2 25 01/03/2021   Lab Results  Component Value Date   ALT 26 04/09/2021   AST 23 04/09/2021   ALKPHOS 60 04/09/2021   BILITOT 0.7 04/09/2021   B12 def: He is on a daily multivitamin.  Lab Results  Component Value Date   VITAMINB12 231 01/03/2021   Depression on sertraline 100 mg daily. He has not done CBT, he did grief counseling after his wife's passing. He started Sertraline initially to suppress libido. He feels like medication helps with anxiety, does not feel depressed. .    09/23/2022    4:18 PM 09/11/2020    1:54 PM  Depression screen PHQ 2/9  Decreased Interest 3 0  Down, Depressed, Hopeless 2 0  PHQ - 2 Score 5 0  Altered sleeping 1 0  Tired, decreased energy 2 0  Change in appetite 1 0  Feeling bad or failure about yourself  0 0  Trouble concentrating 3 0  Moving slowly or fidgety/restless 1 0  Suicidal thoughts 2 0  PHQ-9 Score 15 0  Difficult doing work/chores Somewhat difficult Somewhat difficult      2/28PHQ/2024    7:31 PM  GAD 7 : Generalized Anxiety Score  Nervous, Anxious, on Edge 3  Control/stop worrying 3  Worry too much - different things 3  Trouble relaxing 2  Restless 1  Easily annoyed or irritable 2  Afraid - awful might happen 3  Total GAD 7 Score 17  Anxiety Difficulty Somewhat difficult   Review of Systems  Constitutional:  Negative for activity change, appetite change and fever.  HENT:  Negative for nosebleeds, sore throat, trouble swallowing and  voice change.   Eyes:  Negative for redness and visual disturbance.  Respiratory:  Negative for cough, shortness of breath and wheezing.   Cardiovascular:  Negative for chest pain, palpitations and leg swelling.  Gastrointestinal:  Negative for abdominal pain, blood in stool, nausea and vomiting.  Endocrine: Negative for cold intolerance, heat intolerance, polydipsia, polyphagia and polyuria.  Genitourinary:  Negative for decreased urine volume, dysuria, genital sores, hematuria and testicular pain.  Musculoskeletal:  Positive for arthralgias. Negative for back pain and gait problem.  Skin:  Negative for color change and rash.  Neurological:  Negative for seizures, syncope and weakness.  Hematological:  Negative for adenopathy. Does not bruise/bleed easily.  Psychiatric/Behavioral:  Negative for confusion and sleep disturbance. The patient is nervous/anxious.   All other systems reviewed and are negative.  Current Outpatient Medications on File Prior to Visit  Medication Sig Dispense Refill   aspirin 81 MG tablet Take 81 mg by mouth daily.     hydrochlorothiazide (HYDRODIURIL) 12.5 MG tablet Take 1 tablet (12.5 mg total) by mouth daily. 90 tablet 2   losartan (COZAAR) 100 MG tablet Take 1 tablet (100 mg total) by mouth daily. 90 tablet 2   rosuvastatin (CRESTOR) 40 MG tablet Take 1 tablet (40 mg total) by mouth daily. 90 tablet 3   sertraline (ZOLOFT) 100 MG tablet Take 1 tablet (100 mg total) by mouth daily. 90 tablet 2   No current facility-administered medications on file prior to visit.   Past Medical History:  Diagnosis Date   CAD (coronary artery disease)    Depression 03/26/2013   Family history of heart disease    History of nuclear stress test 06/2011   exercise; negative for ischemia, normal pattern of perfusion    HTN (hypertension) 03/26/2013   Hyperlipidemia    Hypertension    Skin sensation disturbance [id number]   Umbilical hernia [id number]   Past Surgical History:   Procedure Laterality Date   APPENDECTOMY  1977   CORONARY ANGIOPLASTY WITH STENT PLACEMENT  10/2008   xience 3.[place]   [place]ARDIOGRAM  2010   EF ~65%; borderline LA enlargement    Allergies  Allergen Reactions   Simvastatin Other (See Comments)    Myalgias    Family History  Problem Relation Age of Onset   Heart disease Father        CABG at 543  Heart disease Paternal Grandmother    Heart disease Paternal Grandfather    Depression Mother    Social History   Socioeconomic History   Marital status: Married    Spouse name: Not on file   Number of children: 3   Years of education: Not on file   Highest education level: Not on file  Occupational History   Not on file  Tobacco Use  Smoking status: Never   Smokeless tobacco: Never  Vaping Use   Vaping Use: Never used  Substance and Sexual Activity   Alcohol use: Yes    Comment: occasional    Drug use: No   Sexual activity: Not on file  Other Topics Concern   Not on file  Social History Narrative   Not on file   Social Determinants of Health   Financial Resource Strain: Not on file  Food Insecurity: Not on file  Transportation Needs: Not on file  Physical Activity: Not on file  Stress: Not on file  Social Connections: Not on file   Vitals:   09/23/22 1530  BP: 100/72  Pulse: 64  Resp: 12  Temp: 98.5 F (36.9 C)  SpO2: 97%   Body mass index is 27.39 kg/m.  Wt Readings from Last 3 Encounters:  09/23/22 206 lb 3.2 oz (93.5 kg)  01/14/22 244 lb 6.4 oz (110.9 kg)  04/14/21 233 lb 4 oz (105.8 kg)   Physical Exam Vitals and nursing note reviewed.  Constitutional:      General: He is not in acute distress.    Appearance: He is well-developed.  HENT:     Head: Normocephalic and atraumatic.     Right Ear: External ear normal.     Left Ear: Tympanic membrane, ear canal and external ear normal.     Ears:     Comments: Cerumen excess  right ear canal, cannot see TM.    Mouth/Throat:     Mouth: Mucous membranes are moist.     Pharynx: Oropharynx is clear.  Eyes:     Extraocular Movements: Extraocular movements intact.     Conjunctiva/sclera: Conjunctivae normal.     Pupils: Pupils are equal, round, and reactive to light.  Neck:     Thyroid: No thyroid mass.  Cardiovascular:     Rate and Rhythm: Normal rate and regular rhythm.     Pulses:          Dorsalis pedis pulses are 2+ on the right side and 2+ on the left side.     Heart sounds: No murmur heard. Pulmonary:     Effort: Pulmonary effort is normal. No respiratory distress.     Breath sounds: Normal breath sounds.  Abdominal:     Palpations: Abdomen is soft. There is no hepatomegaly or mass.     Tenderness: There is no abdominal tenderness.  Genitourinary:    Comments: No concerns. Musculoskeletal:        General: No tenderness.     Cervical back: Normal range of motion.     Comments: No major deformities appreciated and no signs of synovitis.  Lymphadenopathy:     Cervical: No cervical adenopathy.     Upper Body:     Right upper body: No supraclavicular adenopathy.     Left upper body: No supraclavicular adenopathy.  Skin:    General: Skin is warm.     Findings: No erythema.  Neurological:     General: No focal deficit present.     Mental Status: He is alert and oriented to person, place, and time.     Cranial Nerves: No cranial nerve deficit.     Sensory: No sensory deficit.     Gait: Gait normal.     Deep Tendon Reflexes:     Reflex Scores:      Bicep reflexes are 2+ on the right side and 2+ on the left side.      Patellar reflexes  are 2+ on the right side and 2+ on the left side. Psychiatric:        Mood and Affect: Affect normal. Mood is anxious.   ASSESSMENT AND PLAN:  FranciscoJaystin was seen today for annual exam.  Diagnoses and all orders for this visit: Lab Results  Component Value Date   HGBA1C 5.4 09/23/2022   Lab Results  Component  Value Date   CHOL 130 09/23/2022   HDL 42.40 09/23/2022   LDLCALC 72 09/23/2022   TRIG 81.0 09/23/2022   CHOLHDL 3 09/23/2022   Lab Results  Component Value Date   CREATININE 1.09 09/23/2022   BUN 17 09/23/2022   NA 140 09/23/2022   K 4.1 09/23/2022   CL 103 09/23/2022   CO2 28 09/23/2022   Lab Results  Component Value Date   ALT 15 09/23/2022   AST 16 09/23/2022   ALKPHOS 52 09/23/2022   BILITOT 0.8 09/23/2022   Lab Results  Component Value Date   PSA 0.13 09/23/2022    Lab Results  Component Value Date   VITAMINB12 261 09/23/2022   Routine general medical examination at a health care facility Assessment & Plan: We discussed the importance of regular physical activity and healthy diet for prevention of chronic illness and/or complications. Preventive guidelines reviewed. Vaccination updated. Cologuard for colon cancer screening ordered. Next CPE in a year.   Primary hypertension Assessment & Plan: BP adequately controlled. Continue HCTZ 12.5 mg daily and losartan 100 mg daily as well as low sat diet. Monitor BP regularly.  Orders: -     Comprehensive metabolic panel; Future  Need for shingles vaccine -     Varicella-zoster vaccine IM  B12 deficiency Assessment & Plan: Continue daily multivitamin.  Orders: -     Vitamin B12; Future  Coronary artery disease involving native coronary artery of native heart without angina pectoris Assessment & Plan: He has not followed with his cardiologist since 12/2021, he is planning on establishing with new provider. Asymptomatic. He is concerned about worsening CAD and requesting coronary CTA, after a long discussion he agrees with waiting until he sees new provider. He thinks he does not need a referral, he will let me know if he does so. Continue Aspirin 81 mg daily and Rosuvastatin 40 mg daily.  Orders: -     Lipid panel; Future  Prostate cancer screening -     PSA; Future  Colon cancer screening -      Cologuard  Diabetes mellitus screening -     Hemoglobin A1c; Future  Recurrent major depressive disorder, in partial remission ([place]) Assessment & Plan: PHQ 15, he does not feel like this is an active problem and would like to continue same dose sertraline. Not interested in CBT for now.   Hyperlipidemia, unspecified hyperlipidemia type Assessment & Plan: Last LDL 31 in 03/2021. Continue Rosuvastatin 40 mg daily and low fat diet.   Return in 1 year (on 09/24/2023) for CPE.  Kedrick Mcnamee G. [place], MD  [facility] [place] office.

## 2022-09-24 LAB — COMPREHENSIVE METABOLIC PANEL
ALT: 15 U/L (ref 0–53)
AST: 16 U/L (ref 0–37)
Albumin: 4.3 g/dL (ref 3.5–5.2)
Alkaline Phosphatase: 52 U/L (ref 39–117)
BUN: 17 mg/dL (ref 6–23)
CO2: 28 mEq/L (ref 19–32)
Calcium: 9.6 mg/dL (ref 8.4–10.5)
Chloride: 103 mEq/L (ref 96–112)
Creatinine, Ser: 1.09 mg/dL (ref 0.40–1.50)
GFR: 77.17 mL/min (ref >60.00)
Glucose, Bld: 84 mg/dL (ref 70–99)
Potassium: 4.1 mEq/L (ref 3.5–5.1)
Sodium: 140 mEq/L (ref 135–145)
Total Bilirubin: 0.8 mg/dL (ref 0.2–1.2)
Total Protein: 6.4 g/dL (ref 6.0–8.3)

## 2022-09-24 LAB — LIPID PANEL
Cholesterol: 130 mg/dL (ref 0–200)
HDL: 42.4 mg/dL (ref >39.00)
LDL Cholesterol: 72 mg/dL (ref 0–99)
NonHDL: 88.08
Total CHOL/HDL Ratio: 3
Triglycerides: 81 mg/dL (ref 0.0–149.0)
VLDL: 16.2 mg/dL (ref 0.0–40.0)

## 2022-09-24 LAB — PSA: PSA: 0.13 ng/mL (ref 0.10–4.00)

## 2022-09-24 LAB — HEMOGLOBIN A1C: Hgb A1c MFr Bld: 5.4 % (ref 4.6–6.5)

## 2022-09-24 LAB — VITAMIN B12: Vitamin B-12: 261 pg/mL (ref 211–911)

## 2022-09-24 NOTE — Assessment & Plan Note (Signed)
PHQ 15, he does not feel like this is an active problem and would like to continue same dose sertraline. Not interested in CBT for now.

## 2022-09-24 NOTE — Assessment & Plan Note (Signed)
BP adequately controlled. Continue HCTZ 12.5 mg daily and losartan 100 mg daily as well as low sat diet. Monitor BP regularly.

## 2022-09-24 NOTE — Assessment & Plan Note (Signed)
Continue daily multivitamin.

## 2022-09-24 NOTE — Assessment & Plan Note (Signed)
We discussed the importance of regular physical activity and healthy diet for prevention of chronic illness and/or complications. Preventive guidelines reviewed. Vaccination updated. Cologuard for colon cancer screening ordered. Next CPE in a year.

## 2022-09-24 NOTE — Assessment & Plan Note (Signed)
He has not followed with his cardiologist since 12/2021, he is planning on establishing with new provider. Asymptomatic. He is concerned about worsening CAD and requesting coronary CTA, after a long discussion he agrees with waiting until he sees new provider. He thinks he does not need a referral, he will let me know if he does so. Continue Aspirin 81 mg daily and Rosuvastatin 40 mg daily.

## 2022-09-24 NOTE — Assessment & Plan Note (Signed)
Last LDL 31 in 03/2021. Continue Rosuvastatin 40 mg daily and low fat diet.

## 2022-10-07 LAB — COLOGUARD: COLOGUARD: NEGATIVE

## 2022-12-09 ENCOUNTER — Other Ambulatory Visit: Payer: Self-pay | Admitting: Internal Medicine

## 2022-12-14 ENCOUNTER — Encounter: Payer: Self-pay | Admitting: Cardiovascular Disease

## 2022-12-14 ENCOUNTER — Ambulatory Visit: Payer: 59 | Attending: Cardiovascular Disease | Admitting: Cardiovascular Disease

## 2022-12-14 VITALS — BP 106/72 | HR 51 | Ht 74.0 in | Wt 210.6 lb

## 2022-12-14 DIAGNOSIS — I2510 Atherosclerotic heart disease of native coronary artery without angina pectoris: Secondary | ICD-10-CM | POA: Diagnosis not present

## 2022-12-14 DIAGNOSIS — I10 Essential (primary) hypertension: Secondary | ICD-10-CM

## 2022-12-14 DIAGNOSIS — E7800 Pure hypercholesterolemia, unspecified: Secondary | ICD-10-CM

## 2022-12-14 MED ORDER — HYDROCHLOROTHIAZIDE 12.5 MG PO TABS: 12.5000 mg | ORAL_TABLET | Freq: Every day | ORAL | 3 refills | Status: DC

## 2022-12-14 MED ORDER — LOSARTAN POTASSIUM 100 MG PO TABS: 100.0000 mg | ORAL_TABLET | Freq: Every day | ORAL | 3 refills | Status: DC

## 2022-12-14 MED ORDER — ROSUVASTATIN CALCIUM 40 MG PO TABS: 40.0000 mg | ORAL_TABLET | Freq: Every day | ORAL | 3 refills | Status: DC

## 2022-12-14 NOTE — Progress Notes (Unsigned)
Patient ID: Francisco White, male   DOB: January 24, 1971, 53 y.o.   MRN: [id number]    Cardiology Office Note    Date:  12/17/2022   ID:  ETTORE ERKKILA, DOB 1971/03/11, MRN [id number]  PCP:  [place], Betty G, MD  Cardiologist:   Thurmon Fair, MD   Chief Complaint  Patient presents with   Coronary Artery Disease    History of Present Illness:  Francisco White is a 52 y.o. male with a history of coronary artery disease (Xience 3.5 x 18 drug-eluting stent to LAD in 2010), hypertension, hyperlipidemia, history of depression.  This is our first encounter since 2017.  He did see my partner Dr. Dietrich Pates in 2020 07-2021.  Unfortunately, in the years that have passed since I last saw Francisco White his wife passed away due to subarachnoid hemorrhage.  He feels tremendous responsibility as the only parent for his 2 teenage daughters.  He is therefore very concerned about his health and prognosis.  He is exercising regularly.  He does cardio workouts 4 days a week and lifts weights another 4 days in the week.  He does not have any problems with angina or dyspnea either at rest or with activity.  He has not had palpitations, dizziness, syncope, focal neurological complaints, intermittent claudication, lower extremity edema, orthopnea, PND.  He reports that his mood is stable.  His last functional evaluations or a normal nuclear stress test in 2012 and a normal treadmill ECG stress test in 2016.  On that last that he was able to exercise for over 11 minutes.  He had an echocardiogram in 2022 that was normal with the exception of mild LVH.  Past Medical History:  Diagnosis Date   CAD (coronary artery disease)    Depression 03/26/2013   Family history of heart disease    History of nuclear stress test 06/2011   exercise; negative for ischemia, normal pattern of perfusion    HTN (hypertension) 03/26/2013   Hyperlipidemia    Hypertension    Skin sensation disturbance 04/07/2017   Umbilical hernia 03/26/2013     Past Surgical History:  Procedure Laterality Date   APPENDECTOMY  1977   CORONARY ANGIOPLASTY WITH STENT PLACEMENT  10/2008   xience 3.5x58mm stent to LAD ([place]   HERNIA REPAIR  1996   TRANSTHORACIC ECHOCARDIOGRAM  2010   EF ~65%; borderline LA enlargement     Outpatient Medications Prior to Visit  Medication Sig Dispense Refill   aspirin 81 MG tablet Take 81 mg by mouth daily.     sertraline (ZOLOFT) 100 MG tablet Take 1 tablet (100 mg total) by mouth daily. 90 tablet 2   hydrochlorothiazide (HYDRODIURIL) 12.5 MG tablet Take 1 tablet (12.5 mg total) by mouth daily. 90 tablet 2   losartan (COZAAR) 100 MG tablet Take 1 tablet (100 mg total) by mouth daily. 90 tablet 2   rosuvastatin (CRESTOR) 40 MG tablet Take 1 tablet by mouth daily. 30 tablet 0   No facility-administered medications prior to visit.     Allergies:   Simvastatin   Social History   Socioeconomic History   Marital status: Married    Spouse name: Not on file   Number of children: 3   Years of education: Not on file   Highest education level: Not on file  Occupational History   Not on file  Tobacco Use   Smoking status: Never   Smokeless tobacco: Never  Vaping Use   Vaping Use: Never used  Substance and Sexual Activity   Alcohol use: Yes    Comment: occasional    Drug use: No   Sexual activity: Not on file  Other Topics Concern   Not on file  Social History Narrative   Not on file   Social Determinants of Health   Financial Resource Strain: Not on file  Food Insecurity: Not on file  Transportation Needs: Not on file  Physical Activity: Not on file  Stress: Not on file  Social Connections: Not on file     Family History:  The patient's family history includes Depression in his mother; Heart disease in his father, paternal grandfather, and paternal grandmother.   ROS:   Please see the history of present illness.    ROS All other systems reviewed and are negative.   PHYSICAL  EXAM:   VS:  BP 106/72 (BP Location: Left Arm, Patient Position: Sitting, Cuff Size: Large)   Pulse (!) 51   Ht 6\' 2"  (1.88 m)   Wt 210 lb 9.6 oz (95.5 kg)   SpO2 96%   BMI 27.04 kg/m     General: Alert, oriented x3, no distress, appears to be very fit Head: no evidence of trauma, PERRL, EOMI, no exophtalmos or lid lag, no myxedema, no xanthelasma; normal ears, nose and oropharynx Neck: normal jugular venous pulsations and no hepatojugular reflux; brisk carotid pulses without delay and no carotid bruits Chest: clear to auscultation, no signs of consolidation by percussion or palpation, normal fremitus, symmetrical and full respiratory excursions Cardiovascular: normal position and quality of the apical impulse, regular rhythm, normal first and second heart sounds, no murmurs, rubs or gallops Abdomen: no tenderness or distention, no masses by palpation, no abnormal pulsatility or arterial bruits, normal bowel sounds, no hepatosplenomegaly Extremities: no clubbing, cyanosis or edema; 2+ radial, ulnar and brachial pulses bilaterally; 2+ right femoral, posterior tibial and dorsalis pedis pulses; 2+ left femoral, posterior tibial and dorsalis pedis pulses; no subclavian or femoral bruits Neurological: grossly nonfocal Psych: Normal mood and affect   Wt Readings from Last 3 Encounters:  12/14/22 210 lb 9.6 oz (95.5 kg)  09/23/22 206 lb 3.2 oz (93.5 kg)  01/14/22 244 lb 6.4 oz (110.9 kg)      Studies/Labs Reviewed:   EKG:  EKG is ordered today.  The ekg ordered today demonstrates mild sinus bradycardia otherwise normal tracing.  There are no repolarization abnormalities.  Recent Labs: 09/23/2022: ALT 15; BUN 17; Creatinine, Ser 1.09; Potassium 4.1; Sodium 140   Lipid Panel    Component Value Date/Time   CHOL 130 09/23/2022 1619   CHOL 100 04/09/2021 0840   TRIG 81.0 09/23/2022 1619   HDL 42.40 09/23/2022 1619   HDL 47 04/09/2021 0840   CHOLHDL 3 09/23/2022 1619   VLDL 16.2  09/23/2022 1619   LDLCALC 72 09/23/2022 1619   LDLCALC 31 04/09/2021 0840      ASSESSMENT:    1. Coronary artery disease involving native coronary artery of native heart without angina pectoris   2. Primary hypertension   3. Hypercholesterolemia      PLAN:  In order of problems listed above:  CAD: He is completely asymptomatic and all his risk factors appear to be well addressed (LDL 72 is almost in target range, reduced to well under 50% of his baseline LDL cholesterol).  He asks about undergoing additional testing to screen for progression of the disease.  He specifically mentioned a coronary CT angiogram.  We discussed the fact that CT angiograms  are sometimes nondiagnostic in patients who have previously received coronary stents, although the technology is improving.  More importantly, I pointed out to him that angiographic surveillance in the absence of symptoms does not necessarily improve outcomes.  We talked a lot about the pathophysiology of atherosclerosis, the concept of stable and unstable plaques, the fact that risk factor modification has a more important impact on long-term prognosis than percutaneous revascularization procedures (in the absence of unstable coronary syndromes).  I think a plain treadmill stress test might be more reassuring than an imaging study.  Compared to his previous tests.  In 2012 he exercised for 10 minutes and 30 seconds achieving 10 METS.  In 2016 he exercised for 11 minutes with a maximum workload of 13.5 METS.  Neither test showed ECG evidence of ischemia.  He is not taking beta-blockers due to resting bradycardia. HTN: Blood pressure is well-controlled on the current medications. HLP: Target LDL cholesterol less than 70.  He is on the maximum dose of our most potent statin.  Baseline untreated LDL cholesterol was 204 (total cholesterol 271) in 2022 History of depression: He is clearly feeling the burden of the risk possibilities of a single parent,  but his mood appears to be well compensated on sertraline.     Medication Adjustments/Labs and Tests Ordered: Current medicines are reviewed at length with the patient today.  Concerns regarding medicines are outlined above.  Medication changes, Labs and Tests ordered today are listed in the Patient Instructions below. Patient Instructions  Medication Instructions:  No changes *If you need a refill on your cardiac medications before your next appointment, please call your pharmacy*  Testing/Procedures: Treadmill stress test- Call or message and let us know if you want to schedule this in the [facility]   Follow-Up: At [facility], you and your health needs are our priority.  As part of our continuing mission to provide you with exceptional heart care, we have created designated Provider Care Teams.  These Care Teams include your primary Cardiologist (physician) and Advanced Practice Providers (APPs -  Physician Assistants and Nurse Practitioners) who all work together to provide you with the care you need, when you need it.  We recommend signing up for the patient portal called "MyChart".  Sign up information is provided on this After Visit Summary.  MyChart is used to connect with patients for Virtual Visits (Telemedicine).  Patients are able to view lab/test results, encounter notes, upcoming appointments, etc.  Non-urgent messages can be sent to your provider as well.   To learn more about what you can do with MyChart, go to [email or web address].    Your next appointment:   1 year(s)  Provider:   Dr Royann Shivers       Signed, Thurmon Fair, MD  12/17/2022 11:29 AM    [facility] [address] [place], [place], [place]  [address] Phone: [phone]; Fax: [phone]

## 2022-12-14 NOTE — Patient Instructions (Signed)
Medication Instructions:  No changes *If you need a refill on your cardiac medications before your next appointment, please call your pharmacy*  Testing/Procedures: Treadmill stress test- Call or message and let us know if you want to schedule this in the [facility]   Follow-Up: At [facility], you and your health needs are our priority.  As part of our continuing mission to provide you with exceptional heart care, we have created designated Provider Care Teams.  These Care Teams include your primary Cardiologist (physician) and Advanced Practice Providers (APPs -  Physician Assistants and Nurse Practitioners) who all work together to provide you with the care you need, when you need it.  We recommend signing up for the patient portal called "MyChart".  Sign up information is provided on this After Visit Summary.  MyChart is used to connect with patients for Virtual Visits (Telemedicine).  Patients are able to view lab/test results, encounter notes, upcoming appointments, etc.  Non-urgent messages can be sent to your provider as well.   To learn more about what you can do with MyChart, go to [email or web address].    Your next appointment:   1 year(s)  Provider:   Dr Royann Shivers

## 2022-12-17 ENCOUNTER — Encounter: Payer: Self-pay | Admitting: Cardiovascular Disease

## 2023-01-05 IMAGING — CT CT HEAD W/O CM
3 of 4 series · 14 of 47 positions shown, 16 images · non-contrast
Comparison: None.

CLINICAL DATA: Syncopal episode

EXAM:
CT HEAD WITHOUT CONTRAST
TECHNIQUE: Contiguous axial images were obtained from the base of the skull
through the vertex without intravenous contrast.

[Series 5: coronal soft tissue · coronal · 0.35mm/px · 3 of 83 slices shown]
[im 28/83  brain]
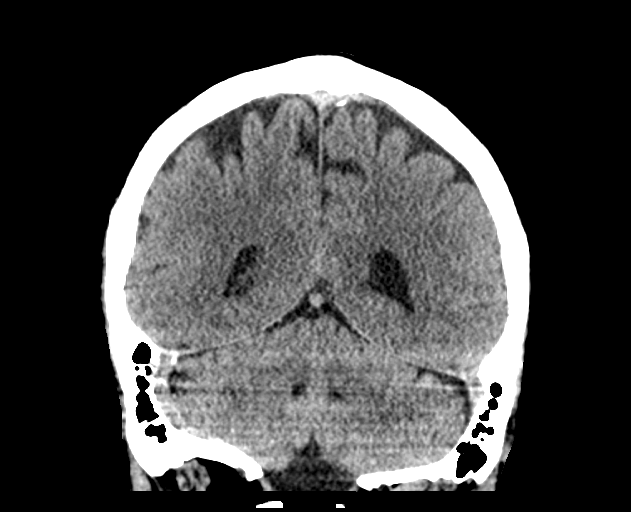
[im 37/83  brain]
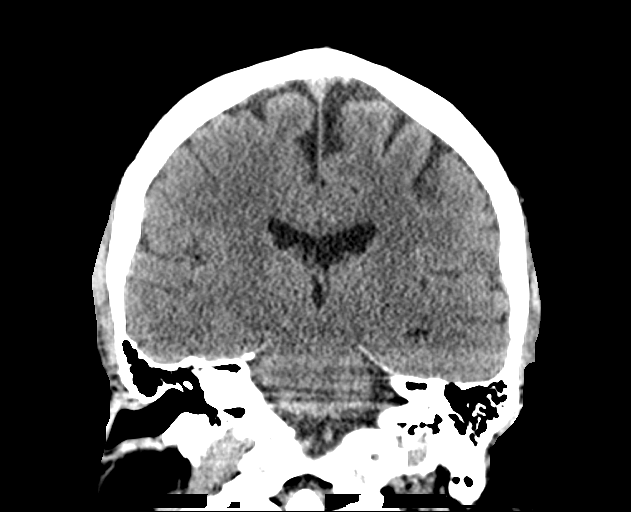
[im 46/83  brain]
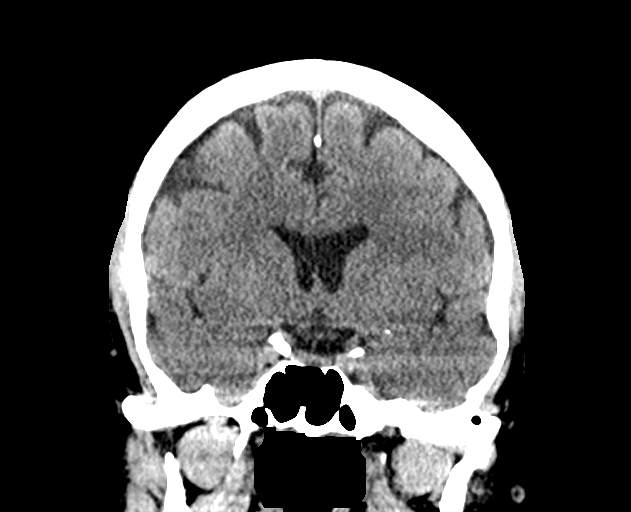

[Series 6: sagittal soft tissue · sagittal · 0.35mm/px · 3 of 75 slices shown]
[im 25/75  brain]
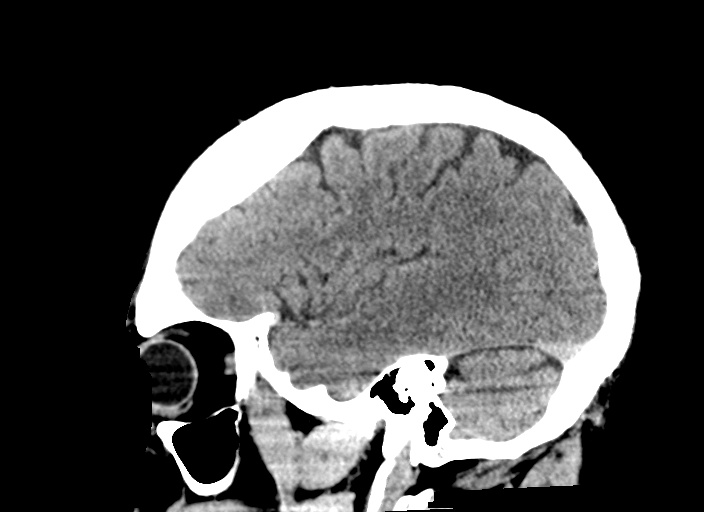
[im 38/75  brain]
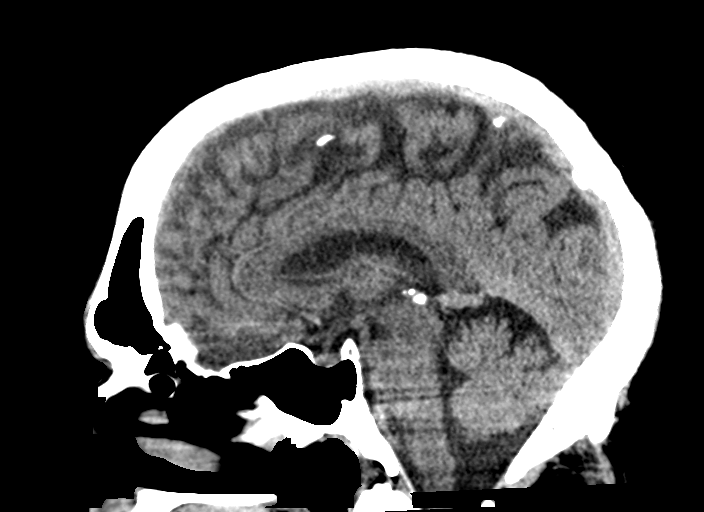
[im 50/75  brain]
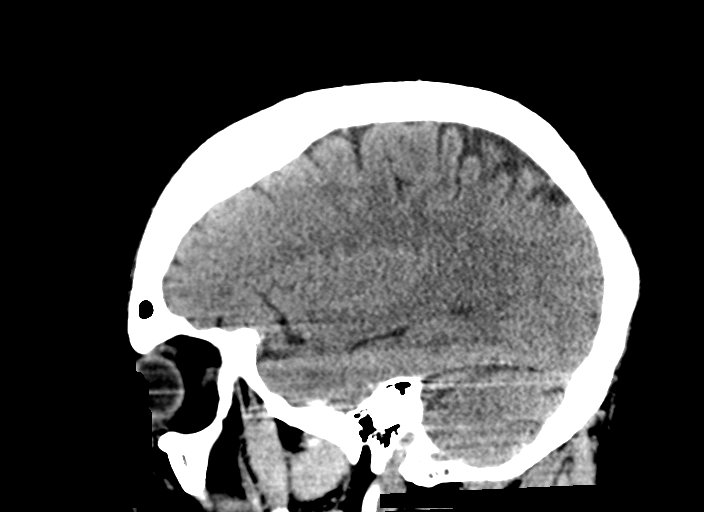

[Series 7: axial recon soft tissue · axial · 0.44mm/px · z∈[-377,-249]mm · 8 of 58 slices shown, 10 images]
[im 7/58  brain]
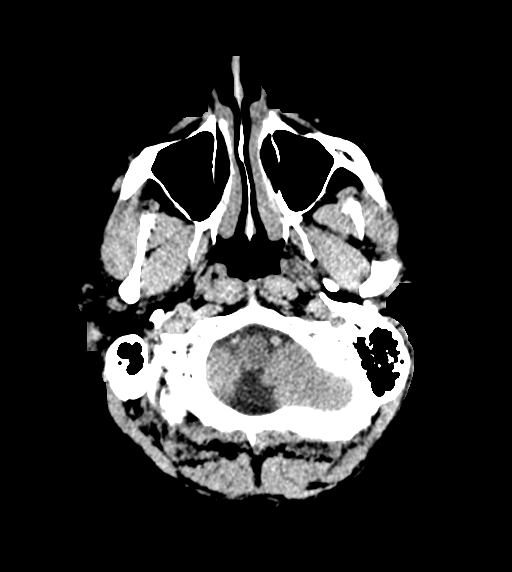
[im 7/58  bone]
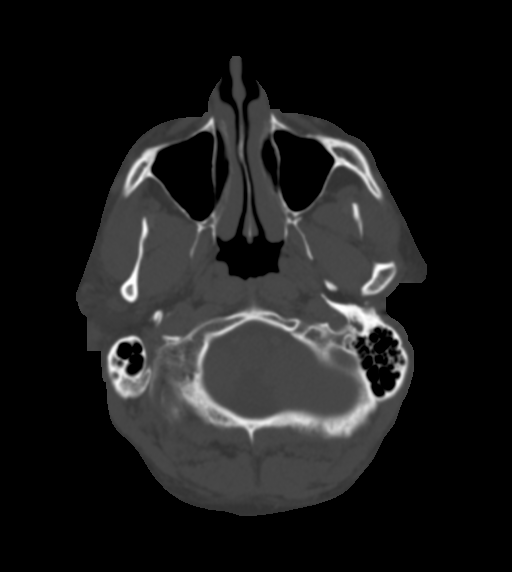
[im 13/58  brain]
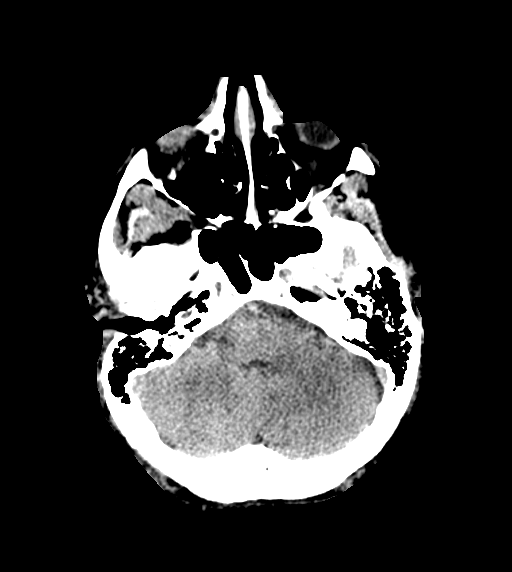
[im 20/58  brain]
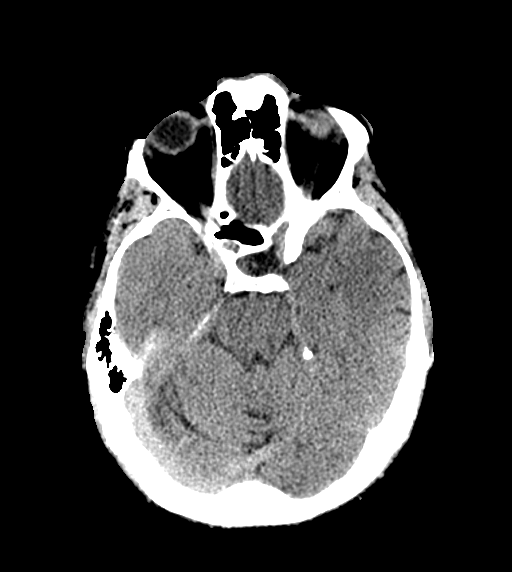
[im 26/58  brain]
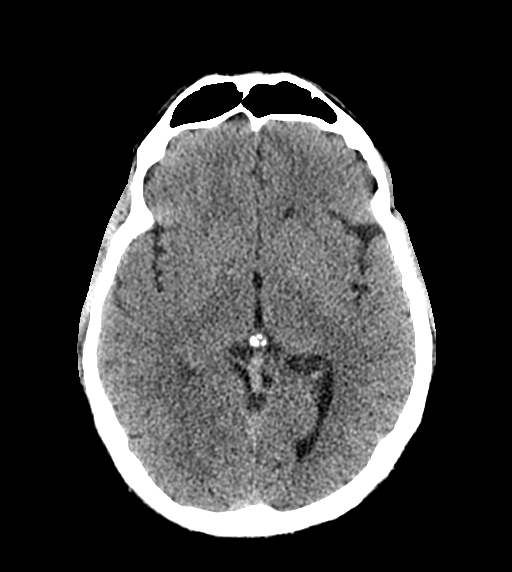
[im 32/58  brain]
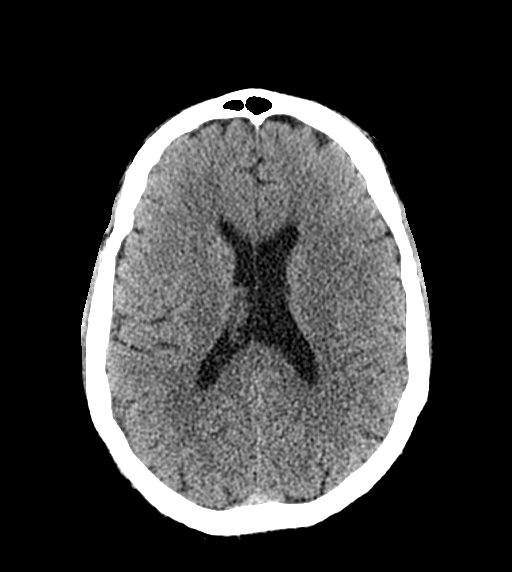
[im 32/58  bone]
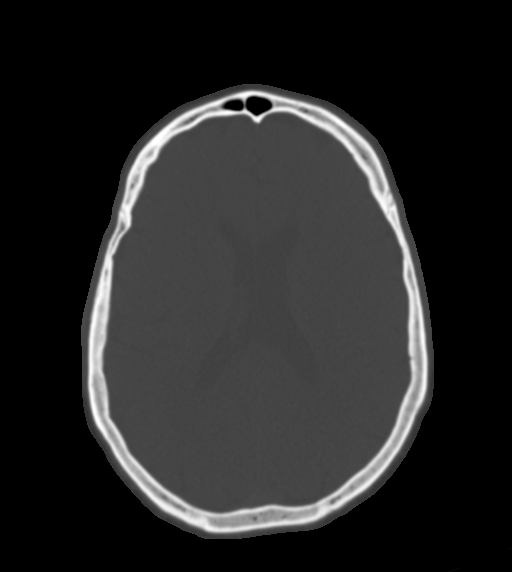
[im 39/58  brain]
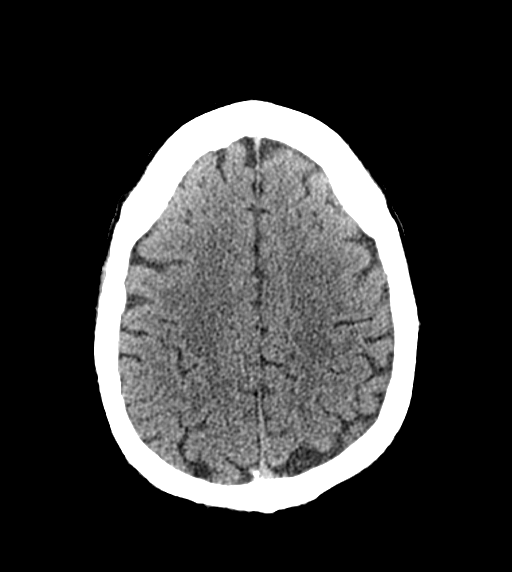
[im 45/58  brain]
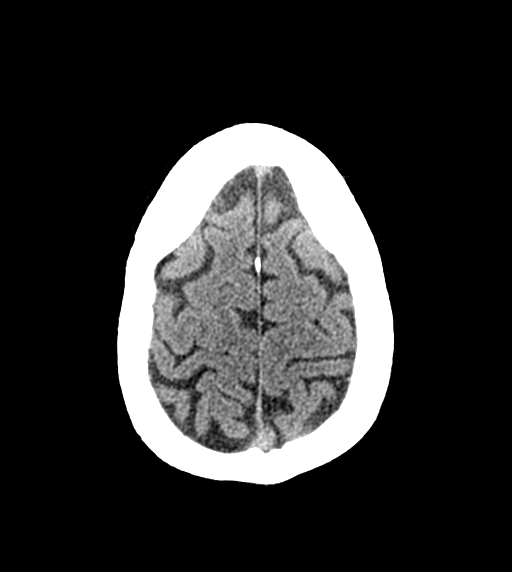
[im 51/58  brain]
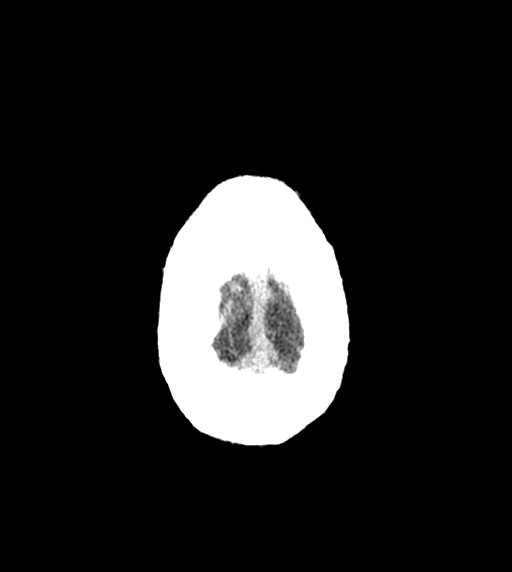

[14 of 47 positions shown; findings below may reference images not displayed]

FINDINGS: Brain: There is no acute intracranial hemorrhage, mass effect, or
edema. Gray-white differentiation is preserved. There is no
extra-axial fluid collection. Ventricles and sulci are within normal
limits in size and configuration.

Vascular: No hyperdense vessel or unexpected calcification.

Skull: Calvarium is unremarkable.

Sinuses/Orbits: Retention cysts of maxillary sinuses. Orbits are
unremarkable.

Other: None.
IMPRESSION: No acute intracranial abnormality.

## 2023-02-20 ENCOUNTER — Other Ambulatory Visit: Payer: Self-pay | Admitting: Family Medicine

## 2023-02-20 DIAGNOSIS — F325 Major depressive disorder, single episode, in full remission: Secondary | ICD-10-CM

## 2023-05-05 ENCOUNTER — Encounter: Payer: Self-pay | Admitting: Family Medicine

## 2023-05-05 ENCOUNTER — Ambulatory Visit (INDEPENDENT_AMBULATORY_CARE_PROVIDER_SITE_OTHER): Payer: 59 | Admitting: Family Medicine

## 2023-05-05 VITALS — BP 122/82 | HR 65 | Temp 97.8°F | Resp 12 | Ht 74.0 in | Wt 210.0 lb

## 2023-05-05 DIAGNOSIS — N5089 Other specified disorders of the male genital organs: Secondary | ICD-10-CM

## 2023-05-05 DIAGNOSIS — N451 Epididymitis: Secondary | ICD-10-CM

## 2023-05-05 MED ORDER — DOXYCYCLINE HYCLATE 100 MG PO TABS
100.0000 mg | ORAL_TABLET | Freq: Two times a day (BID) | ORAL | 0 refills | Status: AC
Start: 2023-05-05 — End: 2023-05-15

## 2023-05-05 NOTE — Progress Notes (Signed)
ACUTE VISIT Chief Complaint  Patient presents with   Testicle Pain    Lump on right testicle, was noted during last visit. PT notes pain started last week and swelling has increased to about "the size of a grape"   HPI: [name] is a 52 y.o. male with a PMHx significant for HTN, HLD, B12 deficiency, anxiety, and CAD, who is here today complaining of testicular pain for about a week.  He complains of right testicular pain for one week. He says he found a mass today upon self examination.  He rates the pain as a 3/10.  Testicle Pain The patient's primary symptoms include testicular pain. The patient's pertinent negatives include no genital injury, genital itching, genital lesions, pelvic pain, penile discharge, penile pain, priapism or scrotal swelling. This is a new problem. The current episode started in the past 7 days. The problem has been gradually worsening. Pertinent negatives include no abdominal pain, chest pain, chills, coughing, discolored urine, dysuria, fever, flank pain, frequency, headaches, hematuria, hesitancy, joint pain, joint swelling, nausea, rash, shortness of breath, sore throat, urgency, urinary retention or vomiting. The testicular pain affects the right testicle. There is swelling in the right testicle. The symptoms are aggravated by activity. He has tried nothing for the symptoms. He is not sexually active.   He denies any penile discharge, trauma, STD exposure, urinary symptoms. States that he has not been sexually active since 11/2021. He has not taken anything for the pain.  He mentions he has has had epididymal cyst in the past, smaller and it was followed. Left testicular pain, for which [place] was done and negative otherwise. Testicular US and pelvic doppler negative in 12/2025.  He has seen urologist for bilateral testicular pain in 2017 and reports that he was treated for an infection related to vasectomy procedure.  Review of Systems   Constitutional:  Negative for chills and fever.  HENT:  Negative for mouth sores and sore throat.   Respiratory:  Negative for cough and shortness of breath.   Cardiovascular:  Negative for chest pain.  Gastrointestinal:  Negative for abdominal pain, nausea and vomiting.  Genitourinary:  Positive for testicular pain. Negative for decreased urine volume, difficulty urinating, dysuria, flank pain, frequency, genital sores, hematuria, hesitancy, pelvic pain, penile discharge, penile pain, scrotal swelling and urgency.  Musculoskeletal:  Negative for joint pain, joint swelling and myalgias.  Skin:  Negative for rash.  Neurological:  Negative for weakness and headaches.  See other pertinent positives and negatives in HPI.  Current Outpatient Medications on File Prior to Visit  Medication Sig Dispense Refill   aspirin 81 MG tablet Take 81 mg by mouth daily.     hydrochlorothiazide (HYDRODIURIL) 12.5 MG tablet Take 1 tablet (12.5 mg total) by mouth daily. 90 tablet 3   losartan (COZAAR) 100 MG tablet Take 1 tablet (100 mg total) by mouth daily. 90 tablet 3   rosuvastatin (CRESTOR) 40 MG tablet Take 1 tablet (40 mg total) by mouth daily. 90 tablet 3   sertraline (ZOLOFT) 100 MG tablet Take 1 tablet (100 mg total) by mouth daily. 90 tablet 3   No current facility-administered medications on file prior to visit.   Past Medical History:  Diagnosis Date   CAD (coronary artery disease)    Depression 03/26/2013   Family history of heart disease    History of nuclear stress test 06/2011   exercise; negative for ischemia, normal pattern of perfusion    HTN (hypertension) 03/26/2013  Hyperlipidemia    Hypertension    Skin sensation disturbance 04/07/2017   Umbilical hernia 03/26/2013   Allergies  Allergen Reactions   Simvastatin Other (See Comments)    Myalgias    Olmesartan Other (See Comments)    Dizziness, lightheadedness     Social History   Socioeconomic History   Marital status:  Married    Spouse name: Not on file   Number of children: 3   Years of education: Not on file   Highest education level: Not on file  Occupational History   Not on file  Tobacco Use   Smoking status: Never   Smokeless tobacco: Never  Vaping Use   Vaping status: Never Used  Substance and Sexual Activity   Alcohol use: Yes    Comment: occasional    Drug use: No   Sexual activity: Not on file  Other Topics Concern   Not on file  Social History Narrative   Not on file   Social Determinants of Health   Financial Resource Strain: Not on file  Food Insecurity: Not on file  Transportation Needs: Not on file  Physical Activity: Not on file  Stress: Not on file  Social Connections: Not on file    Vitals:   05/05/23 1449  BP: 122/82  Pulse: 65  Resp: 12  Temp: 97.8 F (36.6 C)  SpO2: 98%   Body mass index is 26.96 kg/m.  Physical Exam Vitals and nursing note reviewed. Exam conducted with a chaperone present.  Constitutional:      General: He is not in acute distress.    Appearance: He is well-developed.  HENT:     Head: Normocephalic and atraumatic.  Eyes:     Conjunctiva/sclera: Conjunctivae normal.  Cardiovascular:     Rate and Rhythm: Normal rate and regular rhythm.     Heart sounds: No murmur heard. Pulmonary:     Effort: Pulmonary effort is normal. No respiratory distress.     Breath sounds: Normal breath sounds.  Abdominal:     Palpations: Abdomen is soft. There is no mass.     Tenderness: There is no abdominal tenderness.  Genitourinary:    Testes:        Right: Tenderness or swelling not present.        Left: Mass or tenderness not present.     Epididymis:     Right: Enlarged. Mass and tenderness present.     Left: Not enlarged. No mass or tenderness.     Comments: On inspection mass appreciated on right testicle. Very tender with palpation, no local heat or erythema appreciated. It is on epididymal area, about 2.5 cm max diameter. No fluctuate area  appreciated. Lymphadenopathy:     Lower Body: No right inguinal adenopathy. No left inguinal adenopathy.  Skin:    General: Skin is warm.     Findings: No erythema or rash.  Neurological:     Mental Status: He is alert and oriented to person, place, and time.     Cranial Nerves: No cranial nerve deficit.     Gait: Gait normal.  Psychiatric:        Mood and Affect: Mood and affect normal.   ASSESSMENT AND PLAN:  Mr. Coutts was seen today for right testicular pain.   Epididymal mass Because of tenderness it was difficult to evaluate. It seems to be epididymal. We discussed differential diagnosis, including testicular mass. He prefers to hold on scrotal US for now. Instructed to let me know if  problem has not resolved in 2 to 3 weeks, in which case I additionally recommend obtaining imaging.  Epididymitis Denies risk factors for STD. It does not seem to be vascular. Will treat empirically as a bacterial process with doxycycline twice daily for 10 days. General measures for symptoms relief discussed and given on AVS. Instructed about warning signs. Follow-up as needed.  -     Doxycycline Hyclate; Take 1 tablet (100 mg total) by mouth 2 (two) times daily for 10 days.  Dispense: 20 tablet; Refill: 0  I spent a total of 31 minutes in both face to face and non face to face activities for this visit on the date of this encounter. During this time history was obtained and documented, examination was performed, prior labs/imaging reviewed, and assessment/plan discussed.  Return if symptoms worsen or fail to improve, for keep next appointment.  I, Rolla Etienne Wierda, acting as a scribe for Francisco Catlin [place], MD., have documented all relevant documentation on the behalf of Francisco Patchin [place], MD, as directed by  Francisco Omura [place], MD while in the presence of Francisco Plumb [place], MD.   I, Francisco Liberman [place], MD, have reviewed all documentation for this visit. The documentation on 05/05/23 for the exam, diagnosis,  procedures, and orders are all accurate and complete.  Francisco Steffenhagen G. [place], MD  [facility] [place] office.

## 2023-05-05 NOTE — Patient Instructions (Addendum)
A few things to remember from today's visit:  Epididymitis - Plan: doxycycline (VIBRA-TABS) 100 MG tablet If symptoms do not resolve in 2-3 weeks of if it gets worse, we will need an ultrasound.  If you need refills for medications you take chronically, please call your pharmacy. Do not use My Chart to request refills or for acute issues that need immediate attention. If you send a my chart message, it may take a few days to be addressed, specially if I am not in the office.  Please be sure medication list is accurate. If a new problem present, please set up appointment sooner than planned today.

## 2023-11-15 ENCOUNTER — Encounter: Payer: Self-pay | Admitting: Cardiovascular Disease

## 2023-11-16 NOTE — Telephone Encounter (Signed)
 Please go ahead and schedule an ECG treadmill test. It is entirely up to him whether he wants to use his insurance or not. I suspect that even if the insured charge is $1000 his portion of the cost is substantially lower than $500 (but I do not know for sure).

## 2023-11-25 ENCOUNTER — Other Ambulatory Visit: Payer: Self-pay

## 2023-11-25 MED ORDER — ROSUVASTATIN CALCIUM 40 MG PO TABS: 40.0000 mg | ORAL_TABLET | Freq: Every day | ORAL | 0 refills | Status: DC

## 2023-12-16 ENCOUNTER — Encounter: Payer: Self-pay | Admitting: Family Medicine

## 2023-12-16 DIAGNOSIS — I2510 Atherosclerotic heart disease of native coronary artery without angina pectoris: Secondary | ICD-10-CM

## 2023-12-20 NOTE — H&P (View-Only) (Signed)
 Cardiology Office Note:  .   Date:  12/22/2023  ID:  Francisco White, DOB [birth year], MRN [id number] PCP: [place], Betty G, MD  [facility] Providers Cardiologist:  Luana Rumple, MD { History of Present Illness: .    Chief Complaint  Patient presents with   Chest Pain    Francisco White is a 53 y.o. male with history of CAD who presents with chest pain.    History of Present Illness   Francisco White "[name]" is a 53 year old male with coronary artery disease who presents with chest discomfort and shortness of breath.  He has a history of coronary artery disease and underwent percutaneous coronary intervention (PCI) to the left anterior descending artery (LAD) in 2010. Since April 2025, he has been experiencing ongoing symptoms of chest discomfort, described as tightness and burning, accompanied by shortness of breath and a feeling of indigestion. These symptoms occur both at rest and during physical activity, such as climbing stairs.  In mid-May 2025, he was evaluated at [facility], where a coronary CTA was performed. He recalls being informed of concerns regarding his right coronary artery and possible multivessel disease, although a heart catheterization was not conducted at that time. His chest symptoms have been occurring daily and have progressively worsened since April.  His current medications include losartan  100 mg daily, hydrochlorothiazide  (HCTZ) 12.5 mg daily, rosuvastatin , and aspirin  81 mg daily. He has not used nitroglycerin recently, as his supply is from 15 years ago, and he has never been prescribed metoprolol.  He has a significant family history of diabetes, as his father is diabetic. He does not smoke, consume alcohol, or use recreational drugs. 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          Problem  List CAD -PCI LAD 2010 2. HLD -T chol 130, HDL 42, LDL 72, TG 81 3. HTN    ROS: All other ROS reviewed and negative. Pertinent positives noted in the HPI.     Studies Reviewed: Francisco White       CCTA 12/13/2023 1. Severe multivessel disease, favor chronic total occlusion right coronary artery. Angiography recommended. Call report 2. Coronary calcium  score pending. This is surely very high for patient's age. Addendum will be created when this is available. Physical Exam:   VS:  BP 132/77 (BP Location: Right Arm, Patient Position: Sitting, Cuff Size: Large)   Pulse 62   Ht 6\' 2"  (1.88 m)   Wt 224 lb 8 oz (101.8 kg)   SpO2 98%   BMI 28.82 kg/m    Wt Readings from Last 3 Encounters:  12/22/23 224 lb 8 oz (101.8 kg)  05/05/23 210 lb (95.3 kg)  12/14/22 210 lb 9.6 oz (95.5 kg)    GEN: Well nourished, well developed in no acute distress NECK: No JVD; No carotid bruits CARDIAC: RRR, no murmurs, rubs, gallops RESPIRATORY:  Clear to auscultation without rales, wheezing or rhonchi  ABDOMEN: Soft, non-tender, non-distended EXTREMITIES:  No edema; No deformity  ASSESSMENT AND PLAN: .   Assessment and Plan    Unstable angina Ongoing chest discomfort with activity and rest, indicating unstable angina. Coronary CTA shows CTO of RCA and multivessel disease. EKG normal sinus rhythm with incomplete right bundle branch block, no acute ischemic changes. Negative troponins at [facility]. Cardiac catheterization recommended for definitive diagnosis. -  Schedule cardiac catheterization for Friday. - Order CBC and BMP today. - Prescribe Imdur 30 mg daily. - Prescribe sublingual nitroglycerin as needed. - Discuss return precautions: if chest pain worsens or does not resolve, or if feeling unwell, go to the ER, preferably [facility]. - Continue aspirin  81 mg daily.  Coronary artery disease Coronary artery disease with prior PCI to LAD. Symptoms suggest disease progression with possible RCA occlusion and  multivessel involvement. Discussed potential need for bypass surgery if multiple blockages found during catheterization.  Hypertension Blood pressure well controlled on current regimen. - Continue losartan  100 mg daily. - Continue HCTZ 12.5 mg daily.  Hyperlipidemia Managed with rosuvastatin . - Continue rosuvastatin .          Informed Consent   Shared Decision Making/Informed Consent The risks [stroke (1 in 1000), death (1 in 1000), kidney failure [usually temporary] (1 in 500), bleeding (1 in 200), allergic reaction [possibly serious] (1 in 200)], benefits (diagnostic support and management of coronary artery disease) and alternatives of a cardiac catheterization were discussed in detail with Francisco White and he is willing to proceed.      Follow-up: Return in about 3 months (around 03/23/2024).   Signed, Gigi Kyle. Rolm Clos, MD, [facility]  [facility]  [address], [address] [place], [place] [address] [phone]  4:22 PM

## 2023-12-20 NOTE — Progress Notes (Unsigned)
 Cardiology Office Note:  .   Date:  12/22/2023  ID:  Francisco White, DOB 07-13-1971, MRN [id number] PCP: White, Francisco G, MD  [facility] Providers Cardiologist:  Francisco Rumple, MD { History of Present Illness: .    Chief Complaint  Patient presents with   Chest Pain    Francisco White is a 53 y.o. male with history of CAD who presents with chest pain.    History of Present Illness   Francisco White "Francisco White" is a 53 year old male with coronary artery disease who presents with chest discomfort and shortness of breath.  He has a history of coronary artery disease and underwent percutaneous coronary intervention (PCI) to the left anterior descending artery (LAD) in 2010. Since April 2025, he has been experiencing ongoing symptoms of chest discomfort, described as tightness and burning, accompanied by shortness of breath and a feeling of indigestion. These symptoms occur both at rest and during physical activity, such as climbing stairs.  In mid-May 2025, he was evaluated at [facility], where a coronary CTA was performed. He recalls being informed of concerns regarding his right coronary artery and possible multivessel disease, although a heart catheterization was not conducted at that time. His chest symptoms have been occurring daily and have progressively worsened since April.  His current medications include losartan  100 mg daily, hydrochlorothiazide  (HCTZ) 12.5 mg daily, rosuvastatin , and aspirin  81 mg daily. He has not used nitroglycerin recently, as his supply is from 15 years ago, and he has never been prescribed metoprolol.  He has a significant family history of diabetes, as his father is diabetic. He does not smoke, consume alcohol, or use recreational drugs. 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          Problem  List CAD -PCI LAD 2010 2. HLD -T chol 130, HDL 42, LDL 72, TG 81 3. HTN    ROS: All other ROS reviewed and negative. Pertinent positives noted in the HPI.     Studies Reviewed: Francisco White       CCTA 12/13/2023 1. Severe multivessel disease, favor chronic total occlusion right coronary artery. Angiography recommended. Call report 2. Coronary calcium  score pending. This is surely very high for patient's age. Addendum will be created when this is available. Physical Exam:   VS:  BP 132/77 (BP Location: Right Arm, Patient Position: Sitting, Cuff Size: Large)   Pulse 62   Ht 6\' 2"  (1.88 m)   Wt 224 lb 8 oz (101.8 kg)   SpO2 98%   BMI 28.82 kg/m    Wt Readings from Last 3 Encounters:  12/22/23 224 lb 8 oz (101.8 kg)  05/05/23 210 lb (95.3 kg)  12/14/22 210 lb 9.6 oz (95.5 kg)    GEN: Well nourished, well developed in no acute distress NECK: No JVD; No carotid bruits CARDIAC: RRR, no murmurs, rubs, gallops RESPIRATORY:  Clear to auscultation without rales, wheezing or rhonchi  ABDOMEN: Soft, non-tender, non-distended EXTREMITIES:  No edema; No deformity  ASSESSMENT AND PLAN: .   Assessment and Plan    Unstable angina Ongoing chest discomfort with activity and rest, indicating unstable angina. Coronary CTA shows CTO of RCA and multivessel disease. EKG normal sinus rhythm with incomplete right bundle branch block, no acute ischemic changes. Negative troponins at [facility]. Cardiac catheterization recommended for definitive diagnosis. -  Schedule cardiac catheterization for Friday. - Order CBC and BMP today. - Prescribe Imdur 30 mg daily. - Prescribe sublingual nitroglycerin as needed. - Discuss return precautions: if chest pain worsens or does not resolve, or if feeling unwell, go to the ER, preferably [facility]. - Continue aspirin  81 mg daily.  Coronary artery disease Coronary artery disease with prior PCI to LAD. Symptoms suggest disease progression with possible RCA occlusion and  multivessel involvement. Discussed potential need for bypass surgery if multiple blockages found during catheterization.  Hypertension Blood pressure well controlled on current regimen. - Continue losartan  100 mg daily. - Continue HCTZ 12.5 mg daily.  Hyperlipidemia Managed with rosuvastatin . - Continue rosuvastatin .          Informed Consent   Shared Decision Making/Informed Consent The risks [stroke (1 in 1000), death (1 in 1000), kidney failure [usually temporary] (1 in 500), bleeding (1 in 200), allergic reaction [possibly serious] (1 in 200)], benefits (diagnostic support and management of coronary artery disease) and alternatives of a cardiac catheterization were discussed in detail with Mr. Denz and he is willing to proceed.      Follow-up: Return in about 3 months (around 03/23/2024).   Signed, Francisco Kyle. Rolm Clos, MD, [facility]  [facility]  [address] [place], [place] [address] [phone]  4:22 PM

## 2023-12-22 ENCOUNTER — Encounter: Payer: Self-pay | Admitting: Cardiovascular Disease

## 2023-12-22 ENCOUNTER — Ambulatory Visit: Attending: Cardiovascular Disease | Admitting: Cardiovascular Disease

## 2023-12-22 ENCOUNTER — Other Ambulatory Visit (HOSPITAL_COMMUNITY): Payer: Self-pay

## 2023-12-22 VITALS — BP 132/77 | HR 62 | Ht 74.0 in | Wt 224.5 lb

## 2023-12-22 DIAGNOSIS — I200 Unstable angina: Secondary | ICD-10-CM

## 2023-12-22 MED ORDER — ISOSORBIDE MONONITRATE ER 30 MG PO TB24
30.0000 mg | ORAL_TABLET | Freq: Every day | ORAL | 4 refills | Status: DC
  Filled 2023-12-22: qty 30, 30d supply, fill #0

## 2023-12-22 MED ORDER — NITROGLYCERIN 0.4 MG SL SUBL
0.4000 mg | SUBLINGUAL_TABLET | SUBLINGUAL | 3 refills | Status: AC | PRN
  Filled 2023-12-22: qty 25, 10d supply, fill #0

## 2023-12-22 NOTE — Patient Instructions (Signed)
 Medication Instructions:   Isosorbide mon 30 mg  one tablet daily   Nitroglycerin 0.4 mg  as needed for chest pain    *If you need a refill on your cardiac medications before your next appointment, please call your pharmacy*   Lab Work: CBC BMP If you have labs (blood work) drawn today and your tests are completely normal, you will receive your results only by: MyChart Message (if you have MyChart) OR A paper copy in the mail If you have any lab test that is abnormal or we need to change your treatment, we will call you to review the results.   Testing/Procedures:  Schedule on Friday  May 30 , 2025 Your physician has requested that you have a cardiac catheterization. Cardiac catheterization is used to diagnose and/or treat various heart conditions. Doctors may recommend this procedure for a number of different reasons. The most common reason is to evaluate chest pain. Chest pain can be a symptom of coronary artery disease (CAD), and cardiac catheterization can show whether plaque is narrowing or blocking your heart's arteries. This procedure is also used to evaluate the valves, as well as measure the blood flow and oxygen levels in different parts of your heart. Please follow instruction sheet, as given.    [facility] A DEPT OF [place] [address] A DEPT OF THE [place] [address] [place] [address] Dept: [phone] Loc: [phone]  Francisco White  12/22/2023  You are scheduled for a Cardiac Catheterization on Friday, May 30 with Dr. Alyssa Backbone.  1. Please arrive at the [facility] (Main Entrance A) at [facility]: [address] [place], [place] [address] at 8:30 AM (This time is 2 hour(s) before your procedure to ensure your preparation).   Free valet parking service is available. You will check in at ADMITTING. The support person will be asked to wait in the waiting room.  It is OK to have  someone drop you off and come back when you are ready to be discharged.    Special note: Every effort is made to have your procedure done on time. Please understand that emergencies sometimes delay scheduled procedures.  2. Diet: Do not eat solid foods after midnight.  The patient may have clear liquids until 5am upon the day of the procedure.  3. Labs: You will need to have blood (CBC and BMP) drawn on 12/22/2023 after Office Visit.  4. Medication instructions in preparation for your procedure:   Contrast Allergy: No   Do not Take hydrochlorothiazide  (Hydrodiuril )  and Losartan  (Cozaar ) the day of your procedure (12/24/2023).  On the morning of your procedure, take your Aspirin  81 mg and any morning medicines NOT listed above.  You may use sips of water.  5. Plan to go home the same day, you will only stay overnight if medically necessary. 6. Bring a current list of your medications and current insurance cards. 7. You MUST have a responsible person to drive you home. 8. Someone MUST be with you the first 24 hours after you arrive home or your discharge will be delayed. 9. Please wear clothes that are easy to get on and off and wear slip-on shoes.  Thank you for allowing us  to care for you!   -- [facility]   Follow-Up: At [facility], you and your health needs are our priority.  As part of our continuing mission to  provide you with exceptional heart care, we have created designated Provider Care Teams.  These Care Teams include your primary Cardiologist (physician) and Advanced Practice Providers (APPs -  Physician Assistants and Nurse Practitioners) who all work together to provide you with the care you need, when you need it.     Your next appointment:   3 month(s)  The format for your next appointment:   In Person  Provider:   Dr Luana Rumple   Other Instructions

## 2023-12-23 ENCOUNTER — Ambulatory Visit: Payer: Self-pay | Admitting: Cardiovascular Disease

## 2023-12-23 ENCOUNTER — Telehealth: Payer: Self-pay | Admitting: *Deleted

## 2023-12-23 LAB — BASIC METABOLIC PANEL WITH GFR
BUN/Creatinine Ratio: 12 (ref 9–20)
BUN: 14 mg/dL (ref 6–24)
CO2: 23 mmol/L (ref 20–29)
Calcium: 9.7 mg/dL (ref 8.7–10.2)
Chloride: 100 mmol/L (ref 96–106)
Creatinine, Ser: 1.13 mg/dL (ref 0.76–1.27)
Glucose: 85 mg/dL (ref 70–99)
Potassium: 4.1 mmol/L (ref 3.5–5.2)
Sodium: 140 mmol/L (ref 134–144)
eGFR: 77 mL/min/{1.73_m2} (ref >59)

## 2023-12-23 LAB — CBC
Hematocrit: 47.6 % (ref 37.5–51.0)
Hemoglobin: 15.7 g/dL (ref 13.0–17.7)
MCH: 30 pg (ref 26.6–33.0)
MCHC: 33 g/dL (ref 31.5–35.7)
MCV: 91 fL (ref 79–97)
Platelets: 201 10*3/uL (ref 150–450)
RBC: 5.24 x10E6/uL (ref 4.14–5.80)
RDW: 12.1 % (ref 11.6–15.4)
WBC: 10 10*3/uL (ref 3.4–10.8)

## 2023-12-23 NOTE — Telephone Encounter (Addendum)
 Cardiac Catheterization scheduled at [facility] for: Friday Dec 24, 2023 10:30 AM Arrival time [facility] Main Entrance A at: 8:30 AM  Nothing to eat after midnight prior to procedure, clear liquids until 5 AM day of procedure.  Medication instructions: -Hold:  Hydrochlorothiazide -AM of procedure -Other usual morning medications can be taken with sips of water including aspirin  81 mg.  Plan to go home the same day, you will only stay overnight if medically necessary.  You must have responsible adult to drive you home.  Someone must be with you the first 24 hours after you arrive home.  Left message for patient to call back to review procedure instructions

## 2023-12-23 NOTE — Telephone Encounter (Signed)
 Left detailed voicemail message (DPR) with instructions.

## 2023-12-23 NOTE — Telephone Encounter (Signed)
Call placed to patient to review instructions, no answer, voicemail message.

## 2023-12-23 NOTE — Telephone Encounter (Signed)
 Pt calling to let the nurse know he received her VM and understood the instructions.

## 2023-12-24 ENCOUNTER — Encounter (HOSPITAL_COMMUNITY)
Admission: RE | Disposition: A | Discharge status: Home / Self Care | Payer: Self-pay | Source: Home / Self Care | Attending: Internal Medicine

## 2023-12-24 ENCOUNTER — Inpatient Hospital Stay (HOSPITAL_COMMUNITY)
Admission: RE | Admit: 2023-12-24 | Discharge: 2024-01-01 | DRG: 234 | Disposition: A | Discharge status: Home / Self Care | Attending: Internal Medicine | Admitting: Internal Medicine

## 2023-12-24 ENCOUNTER — Encounter (HOSPITAL_COMMUNITY): Payer: Self-pay | Admitting: Internal Medicine

## 2023-12-24 ENCOUNTER — Other Ambulatory Visit: Payer: Self-pay

## 2023-12-24 ENCOUNTER — Inpatient Hospital Stay (HOSPITAL_COMMUNITY)

## 2023-12-24 DIAGNOSIS — J9589 Other postprocedural complications and disorders of respiratory system, not elsewhere classified: Secondary | ICD-10-CM | POA: Diagnosis not present

## 2023-12-24 DIAGNOSIS — E7800 Pure hypercholesterolemia, unspecified: Secondary | ICD-10-CM | POA: Diagnosis not present

## 2023-12-24 DIAGNOSIS — Z951 Presence of aortocoronary bypass graft: Secondary | ICD-10-CM | POA: Diagnosis not present

## 2023-12-24 DIAGNOSIS — I200 Unstable angina: Principal | ICD-10-CM | POA: Diagnosis present

## 2023-12-24 DIAGNOSIS — Z79899 Other long term (current) drug therapy: Secondary | ICD-10-CM

## 2023-12-24 DIAGNOSIS — E785 Hyperlipidemia, unspecified: Secondary | ICD-10-CM | POA: Diagnosis present

## 2023-12-24 DIAGNOSIS — R0689 Other abnormalities of breathing: Secondary | ICD-10-CM | POA: Diagnosis not present

## 2023-12-24 DIAGNOSIS — F32A Depression, unspecified: Secondary | ICD-10-CM | POA: Diagnosis present

## 2023-12-24 DIAGNOSIS — I25110 Atherosclerotic heart disease of native coronary artery with unstable angina pectoris: Principal | ICD-10-CM | POA: Diagnosis present

## 2023-12-24 DIAGNOSIS — Z01810 Encounter for preprocedural cardiovascular examination: Secondary | ICD-10-CM | POA: Diagnosis not present

## 2023-12-24 DIAGNOSIS — I10 Essential (primary) hypertension: Secondary | ICD-10-CM | POA: Diagnosis present

## 2023-12-24 DIAGNOSIS — Z955 Presence of coronary angioplasty implant and graft: Secondary | ICD-10-CM

## 2023-12-24 DIAGNOSIS — R339 Retention of urine, unspecified: Secondary | ICD-10-CM | POA: Diagnosis not present

## 2023-12-24 DIAGNOSIS — I4510 Unspecified right bundle-branch block: Secondary | ICD-10-CM | POA: Diagnosis present

## 2023-12-24 DIAGNOSIS — Z7982 Long term (current) use of aspirin: Secondary | ICD-10-CM

## 2023-12-24 DIAGNOSIS — Z888 Allergy status to other drugs, medicaments and biological substances status: Secondary | ICD-10-CM

## 2023-12-24 DIAGNOSIS — Z634 Disappearance and death of family member: Secondary | ICD-10-CM | POA: Diagnosis not present

## 2023-12-24 DIAGNOSIS — Z818 Family history of other mental and behavioral disorders: Secondary | ICD-10-CM | POA: Diagnosis not present

## 2023-12-24 DIAGNOSIS — Z833 Family history of diabetes mellitus: Secondary | ICD-10-CM | POA: Diagnosis not present

## 2023-12-24 DIAGNOSIS — I2510 Atherosclerotic heart disease of native coronary artery without angina pectoris: Secondary | ICD-10-CM

## 2023-12-24 DIAGNOSIS — D6959 Other secondary thrombocytopenia: Secondary | ICD-10-CM | POA: Diagnosis not present

## 2023-12-24 DIAGNOSIS — Z8249 Family history of ischemic heart disease and other diseases of the circulatory system: Secondary | ICD-10-CM | POA: Diagnosis not present

## 2023-12-24 DIAGNOSIS — D696 Thrombocytopenia, unspecified: Secondary | ICD-10-CM | POA: Diagnosis not present

## 2023-12-24 HISTORY — PX: LEFT HEART CATH AND CORONARY ANGIOGRAPHY: CATH118249

## 2023-12-24 LAB — ECHOCARDIOGRAM COMPLETE
AR max vel: 3.01 cm2
AV Area VTI: 3.18 cm2
AV Area mean vel: 2.88 cm2
AV Mean grad: 4 mmHg
AV Peak grad: 8.1 mmHg
Ao pk vel: 1.42 m/s
Area-P 1/2: 3.54 cm2
Calc EF: 59.9 %
Height: 74 in
MV VTI: 2.65 cm2
S' Lateral: 3.3 cm
Single Plane A2C EF: 59.7 %
Single Plane A4C EF: 58.9 %
Weight: 3473.6 [oz_av]

## 2023-12-24 LAB — HEPARIN LEVEL (UNFRACTIONATED): Heparin Unfractionated: 0.15 [IU]/mL — ABNORMAL LOW (ref 0.30–0.70)

## 2023-12-24 LAB — HEMOGLOBIN A1C
Hgb A1c MFr Bld: 5.4 % (ref 4.8–5.6)
Mean Plasma Glucose: 108.28 mg/dL

## 2023-12-24 SURGERY — LEFT HEART CATH AND CORONARY ANGIOGRAPHY
Anesthesia: LOCAL

## 2023-12-24 MED ORDER — PERFLUTREN LIPID MICROSPHERE
1.0000 mL | INTRAVENOUS | Status: AC | PRN
  Administered 2023-12-24: 2 mL via INTRAVENOUS

## 2023-12-24 MED ORDER — SODIUM CHLORIDE 0.9 % WEIGHT BASED INFUSION
1.0000 mL/kg/h | INTRAVENOUS | Status: DC
Start: 2023-12-24 — End: 2023-12-24

## 2023-12-24 MED ORDER — FENTANYL CITRATE (PF) 100 MCG/2ML IJ SOLN
INTRAMUSCULAR | Status: DC | PRN
  Administered 2023-12-24: 25 ug via INTRAVENOUS

## 2023-12-24 MED ORDER — SODIUM CHLORIDE 0.9% FLUSH
3.0000 mL | Freq: Two times a day (BID) | INTRAVENOUS | Status: DC
  Administered 2023-12-24 – 2023-12-25 (×2): 3 mL via INTRAVENOUS

## 2023-12-24 MED ORDER — HEPARIN SODIUM (PORCINE) 1000 UNIT/ML IJ SOLN
INTRAMUSCULAR | Status: AC
  Filled 2023-12-24: qty 10

## 2023-12-24 MED ORDER — LABETALOL HCL 5 MG/ML IV SOLN: 10.0000 mg | INTRAVENOUS | Status: AC | PRN

## 2023-12-24 MED ORDER — SODIUM CHLORIDE 0.9% FLUSH: 3.0000 mL | INTRAVENOUS | Status: DC | PRN

## 2023-12-24 MED ORDER — MIDAZOLAM HCL 2 MG/2ML IJ SOLN
INTRAMUSCULAR | Status: DC | PRN
  Administered 2023-12-24: 1 mg via INTRAVENOUS

## 2023-12-24 MED ORDER — VERAPAMIL HCL 2.5 MG/ML IV SOLN
INTRAVENOUS | Status: AC
  Filled 2023-12-24: qty 2

## 2023-12-24 MED ORDER — FENTANYL CITRATE (PF) 100 MCG/2ML IJ SOLN
INTRAMUSCULAR | Status: AC
  Filled 2023-12-24: qty 2

## 2023-12-24 MED ORDER — VERAPAMIL HCL 2.5 MG/ML IV SOLN
INTRAVENOUS | Status: DC | PRN
  Administered 2023-12-24: 10 mL via INTRA_ARTERIAL

## 2023-12-24 MED ORDER — LIDOCAINE HCL (PF) 1 % IJ SOLN
INTRAMUSCULAR | Status: AC
Start: 2023-12-24 — End: ?
  Filled 2023-12-24: qty 30

## 2023-12-24 MED ORDER — ONDANSETRON HCL 4 MG/2ML IJ SOLN: 4.0000 mg | Freq: Four times a day (QID) | INTRAMUSCULAR | Status: DC | PRN

## 2023-12-24 MED ORDER — ASPIRIN 81 MG PO CHEW
81.0000 mg | CHEWABLE_TABLET | Freq: Every day | ORAL | Status: DC
  Administered 2023-12-25 – 2023-12-27 (×3): 81 mg via ORAL
  Filled 2023-12-24 (×3): qty 1

## 2023-12-24 MED ORDER — HYDRALAZINE HCL 20 MG/ML IJ SOLN: 10.0000 mg | INTRAMUSCULAR | Status: AC | PRN

## 2023-12-24 MED ORDER — ACETAMINOPHEN 325 MG PO TABS: 650.0000 mg | ORAL_TABLET | ORAL | Status: DC | PRN

## 2023-12-24 MED ORDER — HEPARIN (PORCINE) 25000 UT/250ML-% IV SOLN
1450.0000 [IU]/h | INTRAVENOUS | Status: DC
  Administered 2023-12-24: 1200 [IU]/h via INTRAVENOUS
  Administered 2023-12-25 – 2023-12-27 (×4): 1450 [IU]/h via INTRAVENOUS
  Filled 2023-12-24 (×5): qty 250

## 2023-12-24 MED ORDER — MIDAZOLAM HCL 2 MG/2ML IJ SOLN
INTRAMUSCULAR | Status: AC
  Filled 2023-12-24: qty 2

## 2023-12-24 MED ORDER — IOHEXOL 350 MG/ML SOLN
INTRAVENOUS | Status: DC | PRN
  Administered 2023-12-24: 50 mL via INTRA_ARTERIAL

## 2023-12-24 MED ORDER — HEPARIN SODIUM (PORCINE) 1000 UNIT/ML IJ SOLN
INTRAMUSCULAR | Status: DC | PRN
  Administered 2023-12-24: 5000 [IU] via INTRA_ARTERIAL

## 2023-12-24 MED ORDER — SODIUM CHLORIDE 0.9 % WEIGHT BASED INFUSION: 3.0000 mL/kg/h | INTRAVENOUS | Status: DC

## 2023-12-24 MED ORDER — HEPARIN (PORCINE) IN NACL 2000-0.9 UNIT/L-% IV SOLN
INTRAVENOUS | Status: DC | PRN
  Administered 2023-12-24: 1000 mL

## 2023-12-24 MED ORDER — ASPIRIN 81 MG PO CHEW: 81.0000 mg | CHEWABLE_TABLET | ORAL | Status: DC

## 2023-12-24 MED ORDER — LIDOCAINE HCL (PF) 1 % IJ SOLN
INTRAMUSCULAR | Status: DC | PRN
  Administered 2023-12-24: 2 mL

## 2023-12-24 MED ORDER — SODIUM CHLORIDE 0.9 % IV SOLN: 250.0000 mL | INTRAVENOUS | Status: AC | PRN

## 2023-12-24 SURGICAL SUPPLY — 8 items
CATH DIAG 6FR JR4 (CATHETERS) IMPLANT
CATH INFINITI 5FR ANG PIGTAIL (CATHETERS) IMPLANT
CATH INFINITI AMBI 6FR TG (CATHETERS) IMPLANT
DEVICE RAD COMP TR BAND LRG (VASCULAR PRODUCTS) IMPLANT
GLIDESHEATH SLEND SS 6F .021 (SHEATH) IMPLANT
PACK CARDIAC CATHETERIZATION (CUSTOM PROCEDURE TRAY) ×1 IMPLANT
SET ATX-X65L (MISCELLANEOUS) IMPLANT
WIRE EMERALD 3MM-J .035X260CM (WIRE) IMPLANT

## 2023-12-24 NOTE — Progress Notes (Signed)
 PHARMACY - ANTICOAGULATION CONSULT NOTE  Pharmacy Consult for IV heparin Indication: CAD awaiting CABG decision  Allergies  Allergen Reactions   Simvastatin Other (See Comments)    Myalgias    Olmesartan  Other (See Comments)    Dizziness, lightheadedness     Patient Measurements: Height: 6\' 2"  (188 cm) Weight: 98.5 kg (217 lb 1.6 oz) IBW/kg (Calculated) : 82.2 HEPARIN DW (KG): 101.6  Vital Signs: Temp: 97.9 F (36.6 C) (05/30 2118) Temp Source: Oral (05/30 2118) BP: 124/79 (05/30 2118) Pulse Rate: 60 (05/30 1312)  Labs: Recent Labs    12/22/23 1615 12/24/23 2213  HGB 15.7  --   HCT 47.6  --   PLT 201  --   HEPARINUNFRC  --  0.15*  CREATININE 1.13  --     Estimated Creatinine Clearance: 85.9 mL/min (by C-G formula based on SCr of 1.13 mg/dL).   Medical History: Past Medical History:  Diagnosis Date   CAD (coronary artery disease)    Depression 03/26/2013   Family history of heart disease    History of nuclear stress test 06/2011   exercise; negative for ischemia, normal pattern of perfusion    HTN (hypertension) 03/26/2013   Hyperlipidemia    Hypertension    Skin sensation disturbance 04/07/2017   Umbilical hernia 03/26/2013    Medications:  Infusions:   sodium chloride      heparin 1,200 Units/hr (12/24/23 1616)    Assessment: 53 yo male admitted s/p cath with multivessel CAD.  Pharmacy asked to start IV heparin while awaiting a decision on CABG.  Heparin to start 2 hrs after TR band off -removed at 2 pm.  Heparin level is subtherapeutic at 0.15 on heparin 1200 units/hr. No issues with the infusion or bleeding reported.  Goal of Therapy:  Heparin level 0.3-0.7 units/ml Monitor platelets by anticoagulation protocol: Yes   Plan:  Increase IV heparin to 1500 units/hr. Check heparin level 6 hrs after. Daily heparin level and CBC.  Armanda Bern, PharmD, BCPS Clinical Pharmacist  12/24/2023 11:43 PM   [facility] pharmacy phone numbers are listed on  [email or web address]

## 2023-12-24 NOTE — Plan of Care (Signed)

## 2023-12-24 NOTE — Consult Note (Addendum)
 [address]       [place] [address]             [phone]        Francisco White [facility] Medical Record #[id number] Date of Birth: [birth year]  Referring: Kyra Phy, MD Primary Care: [place], Betty G, MD Primary Cardiologist:Mihai Croitoru, MD  Chief Complaint:   No chief complaint on file.   History of Present Illness:     Francisco White is a 53 year old male with a past medical history of CAD (PCI to LAD in 2010), HTN, and HLD. The patient has been experiencing chest tightness, burning, shortness of breath and feeling of indigestion since April 2025 he reports these are similar to the symptoms he felt prior to his last heart stent. The symptoms occur ar rest and with physical activity. He also notes his arms will fall asleep at times but this has been happening for the last 3 years. He does also notice intermittent palpitations. He denies nausea, vomiting, and LOC. Coronary CTA was performed in May 2025 at [facility] medical center and patient saw in MyChart severe RCA disease and multivessel disease but no follow up was made. He developed chest pain at his daughter's graduation and presented to [facility] ED on 05/23 with chest pain and negative troponin levels, he was discharged the same day. On 05/30 he underwent outpatient cardiac catheterization which showed 50-80% stenosis of the LAD, 80% stenosis of the circumflex, and 70-99% stenosis of the RCA. Due to unstable angina he was admitted to the hospital and started on a heparin  drip. His last echo was in 2022 and showed LVEF 60-65% and mild aortic sclerosis with no aortic stenosis and no other signs of valvularly abnormality.  The patient has a family history of heart disease with his dad and grandfather requiring bypass surgeries in their 44s. He was able to mow the lawn yesterday and lives an active and healthy lifestyle. His 2 daughters are home from college for the summer and will be living with him. His  wife died at [facility] from a brain aneurysm 3 years ago so he is nervous about surgery. 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 He is right hand dominant. The patient currently endorses 1/10 chest pain.  Current Activity/ Functional Status: Patient is independent with mobility/ambulation, transfers, ADL's, IADL's.   Zubrod Score: At the time of surgery this patient's most appropriate activity status/level should be described as: []     0    Normal activity, no symptoms [x]     1    Restricted in physical strenuous activity but ambulatory, able to do out light work []     2    Ambulatory and capable of self care, unable to do work activities, up and about                 more than 50%  Of the time                            []     3    Only limited self care, in bed greater than 50% of waking hours []     4    Completely disabled, no self care, confined to bed or chair []     5    Moribund  Past Medical History:  Diagnosis Date   CAD (coronary artery disease)  Depression 03/26/2013   Family history of heart disease    History of nuclear stress test 06/2011   exercise; negative for ischemia, normal pattern of perfusion    HTN (hypertension) 03/26/2013   Hyperlipidemia    Hypertension    Skin sensation disturbance 04/07/2017   Umbilical hernia 03/26/2013    Past Surgical History:  Procedure Laterality Date   APPENDECTOMY  1977   CORONARY ANGIOPLASTY WITH STENT PLACEMENT  10/2008   xience 3.5x35mm stent to LAD ([place])   HERNIA REPAIR  1996   TRANSTHORACIC ECHOCARDIOGRAM  2010   EF ~65%; borderline LA enlargement     Social History   Tobacco Use  Smoking Status Never  Smokeless Tobacco Never    Social History   Substance and Sexual Activity  Alcohol Use Yes   Comment: occasional      Allergies  Allergen Reactions   Simvastatin Other (See Comments)    Myalgias    Olmesartan  Other (See Comments)    Dizziness, lightheadedness     Current  Facility-Administered Medications  Medication Dose Route Frequency Provider Last Rate Last Admin   0.9 %  sodium chloride  infusion  250 mL Intravenous PRN Thukkani, Arun K, MD       0.9% sodium chloride  infusion  1 mL/kg/hr Intravenous Continuous O'Neal, Cathay Clonts, MD       acetaminophen (TYLENOL) tablet 650 mg  650 mg Oral Q4H PRN Thukkani, Arun K, MD       [START ON 12/25/2023] aspirin  chewable tablet 81 mg  81 mg Oral Pre-Cath O'Neal, Cathay Clonts, MD       fentaNYL (SUBLIMAZE) injection    PRN Thukkani, Arun K, MD   25 mcg at 12/24/23 1011   Heparin (Porcine) in NaCl 2000-0.9 UNIT/L-% SOLN    PRN Thukkani, Arun K, MD   1,000 mL at 12/24/23 1033   heparin sodium (porcine) injection    PRN Thukkani, Arun K, MD   5,000 Units at 12/24/23 1014   hydrALAZINE (APRESOLINE) injection 10 mg  10 mg Intravenous Q20 Min PRN Thukkani, Arun K, MD       iohexol (OMNIPAQUE) 350 MG/ML injection    PRN Thukkani, Arun K, MD   50 mL at 12/24/23 1032   labetalol (NORMODYNE) injection 10 mg  10 mg Intravenous Q10 min PRN Thukkani, Arun K, MD       lidocaine (PF) (XYLOCAINE) 1 % injection    PRN Thukkani, Arun K, MD   2 mL at 12/24/23 1013   midazolam (VERSED) injection    PRN Thukkani, Arun K, MD   1 mg at 12/24/23 1010   ondansetron  (ZOFRAN ) injection 4 mg  4 mg Intravenous Q6H PRN Thukkani, Arun K, MD       Radial Cocktail/Verapamil only    PRN Thukkani, Arun K, MD   10 mL at 12/24/23 1013    Medications Prior to Admission  Medication Sig Dispense Refill Last Dose/Taking   aspirin  81 MG tablet Take 81 mg by mouth daily.   12/24/2023 at  7:00 AM   hydrochlorothiazide  (HYDRODIURIL ) 12.5 MG tablet Take 1 tablet (12.5 mg total) by mouth daily. 90 tablet 3 12/23/2023   isosorbide mononitrate (IMDUR) 30 MG 24 hr tablet Take 1 tablet (30 mg total) by mouth daily. 30 tablet 4 12/23/2023   losartan  (COZAAR ) 100 MG tablet Take 1 tablet (100 mg total) by mouth daily. 90 tablet 3 12/23/2023   rosuvastatin  (CRESTOR ) 40 MG  tablet Take 1 tablet (40 mg total)  by mouth daily. 30 tablet 0 12/23/2023   sertraline  (ZOLOFT ) 100 MG tablet Take 1 tablet (100 mg total) by mouth daily. 90 tablet 3 12/23/2023   nitroGLYCERIN (NITROSTAT) 0.4 MG SL tablet Place 1 tablet (0.4 mg total) under the tongue every 5 (five) minutes as needed for chest pain. Max 3 tablets per event. 90 tablet 3     Family History  Problem Relation Age of Onset   Heart disease Father        CABG at 51   Heart disease Paternal Grandmother    Heart disease Paternal Grandfather    Depression Mother      Review of Systems:   Review of Systems  Constitutional:  Negative for diaphoresis and malaise/fatigue.  HENT:  Negative for hearing loss.   Eyes:  Negative for blurred vision.       Wears glasses  Respiratory:  Positive for shortness of breath. Negative for cough, sputum production and wheezing.   Cardiovascular:  Positive for chest pain and palpitations.  Gastrointestinal:  Negative for constipation, diarrhea, heartburn, nausea and vomiting.  Genitourinary:  Negative for dysuria.  Neurological:  Negative for focal weakness, loss of consciousness and weakness.  Endo/Heme/Allergies:  Does not bruise/bleed easily.  Psychiatric/Behavioral:  Positive for depression. The patient is nervous/anxious.   Last saw the dentist in January   Physical Exam: BP 135/77   Pulse 60   Temp 98 F (36.7 C) (Oral)   Resp 14   Ht 6\' 2"  (1.88 m)   Wt 101.6 kg   SpO2 99%   BMI 28.76 kg/m   General appearance: alert, cooperative, and no distress Head: Normocephalic, without obvious abnormality, atraumatic Neck: no adenopathy, no carotid bruit, no JVD, supple, symmetrical, trachea midline, and thyroid  not enlarged, symmetric, no tenderness/mass/nodules Lymph nodes: Cervical, supraclavicular, and axillary nodes normal. Resp: clear to auscultation bilaterally Cardio: regular rate and rhythm, S1, S2 normal, no murmur, click, rub or gallop GI: soft, non-tender;  bowel sounds normal; no masses,  no organomegaly Extremities: extremities normal, atraumatic, no cyanosis or edema Neurologic: Grossly normal  Diagnostic Studies & Radiology Findings:   LEFT HEART CATH AND CORONARY ANGIOGRAPHY     Dist LM to Ost LAD lesion is 50% stenosed.   Prox LAD to Mid LAD lesion is 75% stenosed.   Mid LAD lesion is 80% stenosed.   Ost Cx to Prox Cx lesion is 80% stenosed.   Prox RCA lesion is 99% stenosed.   Mid RCA to Dist RCA lesion is 70% stenosed.   1st Mrg lesion is 25% stenosed.   1.  Proximal LAD stent with high-grade in-stent restenosis. 2.  Severe multivessel disease. 3.  LVEDP of 22 mmHg.   Dominance: Right      ECHOCARDIOGRAM REPORT    Patient Name:   MUHAMMADALI RIES Date of Exam: 01/03/2021  Medical Rec #:  [id number]        Height:       74.0 in  Accession #:    [id number]       Weight:       241.6 lb  Date of Birth:  12-Jun-1971        BSA:          2.355 m  Patient Age:    50 years         BP:           116/17 mmHg  Patient Gender: M  HR:           63 bpm.  Exam Location:  Inpatient   Procedure: 2D Echo, Cardiac Doppler and Color Doppler   Indications:    Syncope    History:        Patient has prior history of Echocardiogram examinations,  most                 recent 10/27/2008. CAD; Risk Factors:Hypertension and                  Dyslipidemia. S/P PCI.    Sonographer:    Crissie Dome RDCS (AE)  Referring Phys: [id number] [place]   IMPRESSIONS     1. Left ventricular ejection fraction, by estimation, is 60 to 65%. The  left ventricle has normal function. The left ventricle has no regional  wall motion abnormalities. There is mild concentric left ventricular  hypertrophy. Left ventricular diastolic  parameters were normal.   2. Right ventricular systolic function is normal. The right ventricular  size is mildly enlarged. Tricuspid regurgitation signal is inadequate for  assessing PA pressure.   3. The mitral valve is  normal in structure. No evidence of mitral valve  regurgitation. No evidence of mitral stenosis.   4. The aortic valve was not well visualized. Aortic valve regurgitation  is not visualized. Mild aortic valve sclerosis is present, with no  evidence of aortic valve stenosis.   5. The inferior vena cava is normal in size with greater than 50%  respiratory variability, suggesting right atrial pressure of 3 mmHg.   FINDINGS   Left Ventricle: Left ventricular ejection fraction, by estimation, is 60  to 65%. The left ventricle has normal function. The left ventricle has no  regional wall motion abnormalities. The left ventricular internal cavity  size was normal in size. There is   mild concentric left ventricular hypertrophy. Left ventricular diastolic  parameters were normal. Normal left ventricular filling pressure.   Right Ventricle: The right ventricular size is mildly enlarged. No  increase in right ventricular wall thickness. Right ventricular systolic  function is normal. Tricuspid regurgitation signal is inadequate for  assessing PA pressure.   Left Atrium: Left atrial size was normal in size.   Right Atrium: Right atrial size was normal in size.   Pericardium: There is no evidence of pericardial effusion.   Mitral Valve: The mitral valve is normal in structure. No evidence of  mitral valve regurgitation. No evidence of mitral valve stenosis. MV peak  gradient, 5.9 mmHg. The mean mitral valve gradient is 2.0 mmHg.   Tricuspid Valve: The tricuspid valve is normal in structure. Tricuspid  valve regurgitation is not demonstrated. No evidence of tricuspid  stenosis.   Aortic Valve: The aortic valve was not well visualized. Aortic valve  regurgitation is not visualized. Mild aortic valve sclerosis is present,  with no evidence of aortic valve stenosis. Aortic valve mean gradient  measures 6.0 mmHg. Aortic valve peak  gradient measures 11.4 mmHg. Aortic valve area, by VTI measures  2.66 cm.   Pulmonic Valve: The pulmonic valve was normal in structure. Pulmonic valve  regurgitation is not visualized. No evidence of pulmonic stenosis.   Aorta: The aortic root is normal in size and structure.   Venous: The inferior vena cava is normal in size with greater than 50%  respiratory variability, suggesting right atrial pressure of 3 mmHg.   IAS/Shunts: No atrial level shunt detected by color flow Doppler.  LEFT VENTRICLE  PLAX 2D  LVIDd:         4.10 cm  Diastology  LVIDs:         2.40 cm  LV e' medial:    9.03 cm/s  LV PW:         1.20 cm  LV E/e' medial:  12.6  LV IVS:        1.30 cm  LV e' lateral:   11.20 cm/s  LVOT diam:     2.00 cm  LV E/e' lateral: 10.2  LV SV:         92  LV SV Index:   39  LVOT Area:     3.14 cm     RIGHT VENTRICLE             IVC  RV Basal diam:  4.00 cm     IVC diam: 1.90 cm  RV Mid diam:    3.20 cm  RV S prime:     17.60 cm/s  TAPSE (M-mode): 3.4 cm   LEFT ATRIUM             Index       RIGHT ATRIUM           Index  LA diam:        3.60 cm 1.53 cm/m  RA Area:     16.00 cm  LA Vol (A2C):   65.7 ml 27.90 ml/m RA Volume:   40.60 ml  17.24 ml/m  LA Vol (A4C):   88.5 ml 37.58 ml/m  LA Biplane Vol: 80.5 ml 34.18 ml/m   AORTIC VALVE  AV Area (Vmax):    2.75 cm  AV Area (Vmean):   2.61 cm  AV Area (VTI):     2.66 cm  AV Vmax:           169.00 cm/s  AV Vmean:          115.000 cm/s  AV VTI:            0.347 m  AV Peak Grad:      11.4 mmHg  AV Mean Grad:      6.0 mmHg  LVOT Vmax:         148.00 cm/s  LVOT Vmean:        95.500 cm/s  LVOT VTI:          0.294 m  LVOT/AV VTI ratio: 0.85    AORTA  Ao Root diam: 3.60 cm  Ao Asc diam:  3.10 cm   MITRAL VALVE  MV Area (PHT): 3.12 cm     SHUNTS  MV Area VTI:   2.72 cm     Systemic VTI:  0.29 m  MV Peak grad:  5.9 mmHg     Systemic Diam: 2.00 cm  MV Mean grad:  2.0 mmHg  MV Vmax:       1.21 m/s  MV Vmean:      56.7 cm/s  MV Decel Time: 243 msec  MV E velocity: 114.00  cm/s  MV A velocity: 49.30 cm/s  MV E/A ratio:  2.31   Gaylyn Keas MD  Electronically signed by Gaylyn Keas MD  Signature Date/Time: 01/03/2021/9:38:28 AM      Final     Assessment & Plan:  Severe multivessel CAD: Patient would benefit from CABG surgery. Seems to be a good candidate but patient is nervous since his wife died at [facility] 3 years ago and he still has daughters  in college. He reports he would like to look at reviews and determine if he wants to get surgery here or at [facility]. Dr. Deloise Ferries to ultimately determine candidacy and timing but will tentatively plan for CABG surgery on Tuesday. HTN: On Losartan , Imdur and Hydrochlorothiazide  at home HLD: On Rosuvastatin   Randa Burton, PA-C   Agree with above [phone] male with 3V CAD, no significant valvular disease, and preserved biventricular function.  We discussed the option with CABG with use of the left radial.  He is agreeable to proceed.  Kaliya Shreiner Ala Alice

## 2023-12-24 NOTE — Progress Notes (Signed)
  Echocardiogram 2D Echocardiogram has been performed.  Zabdi Mis L Zhane Bluitt RDCS 12/24/2023, 3:39 PM

## 2023-12-24 NOTE — Progress Notes (Signed)
 PHARMACY - ANTICOAGULATION CONSULT NOTE  Pharmacy Consult for IV heparin Indication: CAD awaiting CABG decision  Allergies  Allergen Reactions   Simvastatin Other (See Comments)    Myalgias    Olmesartan  Other (See Comments)    Dizziness, lightheadedness     Patient Measurements: Height: 6\' 2"  (188 cm) Weight: 98.5 kg (217 lb 1.6 oz) IBW/kg (Calculated) : 82.2 HEPARIN DW (KG): 101.6  Vital Signs: Temp: 98.1 F (36.7 C) (05/30 1312) Temp Source: Oral (05/30 1312) BP: 119/72 (05/30 1312) Pulse Rate: 60 (05/30 1312)  Labs: Recent Labs    12/22/23 1615  HGB 15.7  HCT 47.6  PLT 201  CREATININE 1.13    Estimated Creatinine Clearance: 85.9 mL/min (by C-G formula based on SCr of 1.13 mg/dL).   Medical History: Past Medical History:  Diagnosis Date   CAD (coronary artery disease)    Depression 03/26/2013   Family history of heart disease    History of nuclear stress test 06/2011   exercise; negative for ischemia, normal pattern of perfusion    HTN (hypertension) 03/26/2013   Hyperlipidemia    Hypertension    Skin sensation disturbance 04/07/2017   Umbilical hernia 03/26/2013    Medications:  Infusions:   sodium chloride      heparin      Assessment: 53 yo male admitted s/p cath with multivessel CAD.  Pharmacy asked to start IV heparin while awaiting a decision on CABG.  Heparin to start 2 hrs after TR band off -removed at 2 pm.  Goal of Therapy:  Heparin level 0.3-0.7 units/ml Monitor platelets by anticoagulation protocol: Yes   Plan:  Start IV heparin at 1200 units/hr. Check heparin level 6 hrs after gtt starts. Daily heparin level and CBC.  Joanell Mowers, Davey Erp, BCCP Clinical Pharmacist  12/24/2023 2:06 PM   [facility] pharmacy phone numbers are listed on [email or web address]

## 2023-12-24 NOTE — Interval H&P Note (Signed)
 History and Physical Interval Note:  12/24/2023 8:57 AM  Francisco White  has presented today for surgery, with the diagnosis of angina.  The various methods of treatment have been discussed with the patient and family. After consideration of risks, benefits and other options for treatment, the patient has consented to  Procedure(s): LEFT HEART CATH AND CORONARY ANGIOGRAPHY (N/A) as a surgical intervention.  The patient's history has been reviewed, patient examined, no change in status, stable for surgery.  I have reviewed the patient's chart and labs.  Questions were answered to the patient's satisfaction.     [name] K Taylore Hinde

## 2023-12-25 ENCOUNTER — Encounter (HOSPITAL_COMMUNITY): Payer: Self-pay | Admitting: Internal Medicine

## 2023-12-25 DIAGNOSIS — I200 Unstable angina: Secondary | ICD-10-CM | POA: Diagnosis not present

## 2023-12-25 LAB — CBC
HCT: 40.8 % (ref 39.0–52.0)
Hemoglobin: 13.8 g/dL (ref 13.0–17.0)
MCH: 30.1 pg (ref 26.0–34.0)
MCHC: 33.8 g/dL (ref 30.0–36.0)
MCV: 89.1 fL (ref 80.0–100.0)
Platelets: 143 10*3/uL — ABNORMAL LOW (ref 150–400)
RBC: 4.58 MIL/uL (ref 4.22–5.81)
RDW: 12 % (ref 11.5–15.5)
WBC: 8 10*3/uL (ref 4.0–10.5)
nRBC: 0 % (ref 0.0–0.2)

## 2023-12-25 LAB — HEPARIN LEVEL (UNFRACTIONATED)
Heparin Unfractionated: 0.39 [IU]/mL (ref 0.30–0.70)
Heparin Unfractionated: 0.62 [IU]/mL (ref 0.30–0.70)
Heparin Unfractionated: 0.44 [IU]/mL (ref 0.30–0.70)

## 2023-12-25 MED ORDER — SERTRALINE HCL 100 MG PO TABS
100.0000 mg | ORAL_TABLET | Freq: Every day | ORAL | Status: DC
  Administered 2023-12-25 – 2024-01-01 (×7): 100 mg via ORAL
  Filled 2023-12-25 (×7): qty 1

## 2023-12-25 MED ORDER — ISOSORBIDE MONONITRATE ER 30 MG PO TB24
30.0000 mg | ORAL_TABLET | Freq: Every day | ORAL | Status: DC
  Administered 2023-12-25 – 2023-12-27 (×3): 30 mg via ORAL
  Filled 2023-12-25 (×3): qty 1

## 2023-12-25 MED ORDER — ROSUVASTATIN CALCIUM 20 MG PO TABS
40.0000 mg | ORAL_TABLET | Freq: Every day | ORAL | Status: DC
  Administered 2023-12-25 – 2024-01-01 (×7): 40 mg via ORAL
  Filled 2023-12-25 (×7): qty 2

## 2023-12-25 NOTE — Progress Notes (Signed)
 PHARMACY - ANTICOAGULATION CONSULT NOTE  Pharmacy Consult for IV heparin  Indication: CAD awaiting CABG decision  Allergies  Allergen Reactions   Simvastatin Other (See Comments)    Myalgias    Olmesartan  Other (See Comments)    Dizziness, lightheadedness     Patient Measurements: Height: 6\' 2"  (188 cm) Weight: 98.5 kg (217 lb 1.6 oz) IBW/kg (Calculated) : 82.2 HEPARIN  DW (KG): 101.6  Vital Signs: Temp: 98.5 F (36.9 C) (05/31 1603) Temp Source: Oral (05/31 1603) BP: 129/69 (05/31 1603) Pulse Rate: 60 (05/31 1603)  Labs: Recent Labs    12/25/23 0452 12/25/23 0812 12/25/23 1608  HGB 13.8  --   --   HCT 40.8  --   --   PLT 143*  --   --   HEPARINUNFRC 0.39 0.62 0.44    Estimated Creatinine Clearance: 85.9 mL/min (by C-G formula based on SCr of 1.13 mg/dL).   Medical History: Past Medical History:  Diagnosis Date   CAD (coronary artery disease)    Depression 03/26/2013   Family history of heart disease    History of nuclear stress test 06/2011   exercise; negative for ischemia, normal pattern of perfusion    HTN (hypertension) 03/26/2013   Hyperlipidemia    Hypertension    Skin sensation disturbance 04/07/2017   Umbilical hernia 03/26/2013    Medications:  Infusions:   heparin  1,450 Units/hr (12/25/23 1015)    Assessment: 53 yo male admitted s/p cath with multivessel CAD.  Pharmacy asked to start IV heparin  while awaiting a decision on CABG.  Heparin  to start 2 hrs after TR band off -removed at 2 pm.  5/31 AM: Heparin  level initially drawn ~5 hours after rate change and was therapeutic at 0.39 while on 1500 units/hr. Urgent repeat level was drawn ~ 8 hours after rate change and was also therapeutic at 0.62 while on 1500 units/hr. Hgb stable 13.8, PLT decreased to 143 (201 on 12/22/23). No issues with infusion and no new bleeding noted per RN. Since approaching higher end of therapeutic, will decrease infusion rate slightly. Noted plans for possible CABG on  12/28/23.   1639: Repeat heparin  level therapeutic at 0.44  Goal of Therapy:  Heparin  level 0.3-0.7 units/ml Monitor platelets by anticoagulation protocol: Yes   Plan:  Continue IV heparin  at 1450 units/hr. Daily heparin  level and CBC Possible CABG 6/3  Heddy Liverpool, PharmD PGY2 Critical Care Pharmacy Resident 12/25/2023 4:39 PM

## 2023-12-25 NOTE — Progress Notes (Signed)
   Rounding Note    Patient Name: Francisco White Date of Encounter: 12/25/2023  [facility] Cardiologist: Luana Rumple, MD   Subjective   NAEO. No CP this AM.  Vital Signs    Vitals:   12/24/23 2118 12/24/23 2355 12/25/23 0500 12/25/23 0742  BP: 124/79 112/61  135/76  Pulse:    (!) 47  Resp: 18 16 17 18   Temp: 97.9 F (36.6 C) 98 F (36.7 C) 98 F (36.7 C) 97.7 F (36.5 C)  TempSrc: Oral Oral Oral Axillary  SpO2:    99%  Weight:      Height:        Intake/Output Summary (Last 24 hours) at 12/25/2023 0930 Last data filed at 12/25/2023 6213 Gross per 24 hour  Intake 796.51 ml  Output --  Net 796.51 ml      12/24/2023    1:12 PM 12/24/2023    8:48 AM 12/22/2023    3:13 PM  Last 3 Weights  Weight (lbs) 217 lb 1.6 oz 224 lb 224 lb 8 oz  Weight (kg) 98.476 kg 101.606 kg 101.833 kg      Telemetry    Personally Reviewed  Physical Exam   GEN: No acute distress.   Cardiac: RRR, no murmurs, rubs, or gallops.  Respiratory: Clear to auscultation bilaterally. Psych: Normal affect   Assessment & Plan    #CAD #UA Severe CAD. Awaiting CABG with [facility] Tuesday. Hemodynamically and electrically stable. Continue Aspirin  and Heparin  Continue imdur  Cont statin       Donelda Fujita T. Marven Slimmer, MD, [facility], [facility] Cardiac Electrophysiology

## 2023-12-25 NOTE — Plan of Care (Signed)
  Problem: Cardiovascular: Goal: Ability to achieve and maintain adequate cardiovascular perfusion will improve Outcome: Progressing Goal: Vascular access site(s) Level 0-1 will be maintained Outcome: Completed/Met   Problem: Clinical Measurements: Goal: Ability to maintain clinical measurements within normal limits will improve Outcome: Progressing Goal: Will remain free from infection Outcome: Progressing Goal: Diagnostic test results will improve Outcome: Progressing Goal: Respiratory complications will improve Outcome: Progressing Goal: Cardiovascular complication will be avoided Outcome: Progressing

## 2023-12-25 NOTE — Progress Notes (Signed)
 PHARMACY - ANTICOAGULATION CONSULT NOTE  Pharmacy Consult for IV heparin  Indication: CAD awaiting CABG decision  Allergies  Allergen Reactions   Simvastatin Other (See Comments)    Myalgias    Olmesartan  Other (See Comments)    Dizziness, lightheadedness     Patient Measurements: Height: 6\' 2"  (188 cm) Weight: 98.5 kg (217 lb 1.6 oz) IBW/kg (Calculated) : 82.2 HEPARIN  DW (KG): 101.6  Vital Signs: Temp: 97.7 F (36.5 C) (05/31 0742) Temp Source: Axillary (05/31 0742) BP: 135/76 (05/31 0742) Pulse Rate: 47 (05/31 0742)  Labs: Recent Labs    12/22/23 1615 12/24/23 2213 12/25/23 0452 12/25/23 0812  HGB 15.7  --  13.8  --   HCT 47.6  --  40.8  --   PLT 201  --  143*  --   HEPARINUNFRC  --  0.15* 0.39 0.62  CREATININE 1.13  --   --   --     Estimated Creatinine Clearance: 85.9 mL/min (by C-G formula based on SCr of 1.13 mg/dL).   Medical History: Past Medical History:  Diagnosis Date   CAD (coronary artery disease)    Depression 03/26/2013   Family history of heart disease    History of nuclear stress test 06/2011   exercise; negative for ischemia, normal pattern of perfusion    HTN (hypertension) 03/26/2013   Hyperlipidemia    Hypertension    Skin sensation disturbance 04/07/2017   Umbilical hernia 03/26/2013    Medications:  Infusions:   sodium chloride      heparin  1,500 Units/hr (12/25/23 0657)    Assessment: 53 yo male admitted s/p cath with multivessel CAD.  Pharmacy asked to start IV heparin  while awaiting a decision on CABG.  Heparin  to start 2 hrs after TR band off -removed at 2 pm.  5/31 AM: Heparin  level initially drawn ~5 hours after rate change and was therapeutic at 0.39 while on 1500 units/hr. Urgent repeat level was drawn ~ 8 hours after rate change and was also therapeutic at 0.62 while on 1500 units/hr. Hgb stable 13.8, PLT decreased to 143 (201 on 12/22/23). No issues with infusion and no new bleeding noted per RN. Since approaching higher  end of therapeutic, will decrease infusion rate slightly. Noted plans for possible CABG on 12/28/23.   Goal of Therapy:  Heparin  level 0.3-0.7 units/ml Monitor platelets by anticoagulation protocol: Yes   Plan:  Decrease IV heparin  to 1450 units/hr. Check confirmatory heparin  level in 6 hours Daily heparin  level and CBC.  Juleen Oakland, PharmD PGY1 Pharmacy Resident 12/25/2023 8:48 AM

## 2023-12-25 NOTE — Progress Notes (Signed)
 CARDIAC REHAB PHASE I  Ed given to pt. Discussed surgery, sternal precautions, IS use, OHS booklet, ambulation, and care after discharge. Pt voiced understanding.  Will continue to follow.  [id number] Floretta Huron, MS, ACSM-CEP 12/25/2023 10:54 AM

## 2023-12-26 DIAGNOSIS — I200 Unstable angina: Secondary | ICD-10-CM | POA: Diagnosis not present

## 2023-12-26 LAB — BASIC METABOLIC PANEL WITH GFR
Anion gap: 7 (ref 5–15)
BUN: 18 mg/dL (ref 6–20)
CO2: 24 mmol/L (ref 22–32)
Calcium: 8.9 mg/dL (ref 8.9–10.3)
Chloride: 108 mmol/L (ref 98–111)
Creatinine, Ser: 1 mg/dL (ref 0.61–1.24)
GFR, Estimated: >60 mL/min (ref >60)
Glucose, Bld: 96 mg/dL (ref 70–99)
Potassium: 3.9 mmol/L (ref 3.5–5.1)
Sodium: 139 mmol/L (ref 135–145)

## 2023-12-26 LAB — CBC
HCT: 38.7 % — ABNORMAL LOW (ref 39.0–52.0)
Hemoglobin: 13.1 g/dL (ref 13.0–17.0)
MCH: 30.5 pg (ref 26.0–34.0)
MCHC: 33.9 g/dL (ref 30.0–36.0)
MCV: 90.2 fL (ref 80.0–100.0)
Platelets: 139 10*3/uL — ABNORMAL LOW (ref 150–400)
RBC: 4.29 MIL/uL (ref 4.22–5.81)
RDW: 12.1 % (ref 11.5–15.5)
WBC: 8.1 10*3/uL (ref 4.0–10.5)
nRBC: 0 % (ref 0.0–0.2)

## 2023-12-26 LAB — HEPARIN LEVEL (UNFRACTIONATED): Heparin Unfractionated: 0.63 [IU]/mL (ref 0.30–0.70)

## 2023-12-26 NOTE — Progress Notes (Signed)
   Rounding Note    Patient Name: Francisco White Date of Encounter: 12/26/2023  [facility] Cardiologist: Luana Rumple, MD   Subjective   NAEO. No CP this AM.  Vital Signs    Vitals:   12/25/23 1953 12/25/23 2346 12/26/23 0445 12/26/23 0809  BP: 114/60 (!) 106/51 114/67 113/71  Pulse: (!) 56 (!) 45 (!) 43   Resp: 18 16 17 18   Temp: 98.4 F (36.9 C) 98.2 F (36.8 C) 98 F (36.7 C) 98.2 F (36.8 C)  TempSrc: Oral Oral Oral Oral  SpO2: 98% 97% 98%   Weight:      Height:        Intake/Output Summary (Last 24 hours) at 12/26/2023 0846 Last data filed at 12/25/2023 2058 Gross per 24 hour  Intake 681.56 ml  Output --  Net 681.56 ml      12/24/2023    1:12 PM 12/24/2023    8:48 AM 12/22/2023    3:13 PM  Last 3 Weights  Weight (lbs) 217 lb 1.6 oz 224 lb 224 lb 8 oz  Weight (kg) 98.476 kg 101.606 kg 101.833 kg      Telemetry    Personally Reviewed  Physical Exam   GEN: No acute distress.   Respiratory: No IWOB. Psych: Normal affect   Assessment & Plan    #CAD #UA Severe CAD. Awaiting CABG with [facility] Tuesday. Hemodynamically and electrically stable. Continue Aspirin  and Heparin  Continue imdur  Cont statin       Donelda Fujita T. Marven Slimmer, MD, [facility], [facility] Cardiac Electrophysiology

## 2023-12-26 NOTE — Progress Notes (Signed)
 PHARMACY - ANTICOAGULATION CONSULT NOTE  Pharmacy Consult for IV heparin  Indication: CAD awaiting CABG decision  Allergies  Allergen Reactions   Simvastatin Other (See Comments)    Myalgias    Olmesartan  Other (See Comments)    Dizziness, lightheadedness     Patient Measurements: Height: 6\' 2"  (188 cm) Weight: 98.5 kg (217 lb 1.6 oz) IBW/kg (Calculated) : 82.2 HEPARIN  DW (KG): 101.6  Vital Signs: Temp: 98 F (36.7 C) (06/01 0445) Temp Source: Oral (06/01 0445) BP: 114/67 (06/01 0445) Pulse Rate: 43 (06/01 0445)  Labs: Recent Labs    12/25/23 0452 12/25/23 0812 12/25/23 1608 12/26/23 0443  HGB 13.8  --   --  13.1  HCT 40.8  --   --  38.7*  PLT 143*  --   --  139*  HEPARINUNFRC 0.39 0.62 0.44 0.63  CREATININE  --   --   --  1.00    Estimated Creatinine Clearance: 97 mL/min (by C-G formula based on SCr of 1 mg/dL).   Medical History: Past Medical History:  Diagnosis Date   CAD (coronary artery disease)    Depression 03/26/2013   Family history of heart disease    History of nuclear stress test 06/2011   exercise; negative for ischemia, normal pattern of perfusion    HTN (hypertension) 03/26/2013   Hyperlipidemia    Hypertension    Skin sensation disturbance 04/07/2017   Umbilical hernia 03/26/2013    Medications:  Infusions:   heparin  1,450 Units/hr (12/26/23 0304)   Assessment: 53 yo male admitted s/p cath with multivessel CAD.  Pharmacy asked to start IV heparin  while awaiting a decision on CABG.  Heparin  to start 2 hrs after TR band off -removed at 2 pm.  6/1 AM: Heparin  level remains therapeutic at 0.63 while on 1450 units/hr. Hgb stable at 13.1, PLT stable at 139. No issues with infusion and no new bleeding noted per RN. Noted plans for possible CABG on 12/28/23.   Goal of Therapy:  Heparin  level 0.3-0.7 units/ml Monitor platelets by anticoagulation protocol: Yes   Plan:  Continue IV heparin  at 1450 units/hr. Daily heparin  level and CBC Possible  CABG 6/3  Juleen Oakland, PharmD PGY1 Pharmacy Resident 12/26/2023 7:55 AM

## 2023-12-27 ENCOUNTER — Inpatient Hospital Stay (HOSPITAL_COMMUNITY)

## 2023-12-27 DIAGNOSIS — Z01810 Encounter for preprocedural cardiovascular examination: Secondary | ICD-10-CM

## 2023-12-27 DIAGNOSIS — I200 Unstable angina: Secondary | ICD-10-CM | POA: Diagnosis not present

## 2023-12-27 LAB — BLOOD GAS, ARTERIAL
Acid-Base Excess: 0.8 mmol/L (ref 0.0–2.0)
Bicarbonate: 25.3 mmol/L (ref 20.0–28.0)
Drawn by: 33176
O2 Saturation: 97.5 %
Patient temperature: 36.7
pCO2 arterial: 38 mmHg (ref 32–48)
pH, Arterial: 7.42 (ref 7.35–7.45)
pO2, Arterial: 81 mmHg — ABNORMAL LOW (ref 83–108)

## 2023-12-27 LAB — BASIC METABOLIC PANEL WITH GFR
Anion gap: 5 (ref 5–15)
BUN: 14 mg/dL (ref 6–20)
CO2: 26 mmol/L (ref 22–32)
Calcium: 8.9 mg/dL (ref 8.9–10.3)
Chloride: 107 mmol/L (ref 98–111)
Creatinine, Ser: 1.05 mg/dL (ref 0.61–1.24)
GFR, Estimated: >60 mL/min (ref >60)
Glucose, Bld: 95 mg/dL (ref 70–99)
Potassium: 3.9 mmol/L (ref 3.5–5.1)
Sodium: 138 mmol/L (ref 135–145)

## 2023-12-27 LAB — ABO/RH: ABO/RH(D): A POS

## 2023-12-27 LAB — CBC
HCT: 40.7 % (ref 39.0–52.0)
Hemoglobin: 13.7 g/dL (ref 13.0–17.0)
MCH: 30 pg (ref 26.0–34.0)
MCHC: 33.7 g/dL (ref 30.0–36.0)
MCV: 89.3 fL (ref 80.0–100.0)
Platelets: 135 10*3/uL — ABNORMAL LOW (ref 150–400)
RBC: 4.56 MIL/uL (ref 4.22–5.81)
RDW: 12 % (ref 11.5–15.5)
WBC: 7.1 10*3/uL (ref 4.0–10.5)
nRBC: 0 % (ref 0.0–0.2)

## 2023-12-27 LAB — VAS US DOPPLER PRE CABG
Left ABI: 1.26
Right ABI: 1.26

## 2023-12-27 LAB — URINALYSIS, ROUTINE W REFLEX MICROSCOPIC
Bilirubin Urine: NEGATIVE
Glucose, UA: NEGATIVE mg/dL
Hgb urine dipstick: NEGATIVE
Ketones, ur: NEGATIVE mg/dL
Leukocytes,Ua: NEGATIVE
Nitrite: NEGATIVE
Protein, ur: NEGATIVE mg/dL
Specific Gravity, Urine: 1.012 (ref 1.005–1.030)
pH: 7 (ref 5.0–8.0)

## 2023-12-27 LAB — LIPOPROTEIN A (LPA): Lipoprotein (a): 125.6 nmol/L — ABNORMAL HIGH (ref <75.0)

## 2023-12-27 LAB — PROTIME-INR
INR: 1.1 (ref 0.8–1.2)
Prothrombin Time: 14.8 s (ref 11.4–15.2)

## 2023-12-27 LAB — SURGICAL PCR SCREEN
MRSA, PCR: NEGATIVE
Staphylococcus aureus: NEGATIVE

## 2023-12-27 LAB — APTT: aPTT: 75 s — ABNORMAL HIGH (ref 24–36)

## 2023-12-27 LAB — PREPARE RBC (CROSSMATCH)

## 2023-12-27 LAB — HEPARIN LEVEL (UNFRACTIONATED): Heparin Unfractionated: 0.52 [IU]/mL (ref 0.30–0.70)

## 2023-12-27 MED ORDER — METOPROLOL TARTRATE 12.5 MG HALF TABLET
12.5000 mg | ORAL_TABLET | Freq: Once | ORAL | Status: AC
  Administered 2023-12-28: 12.5 mg via ORAL
  Filled 2023-12-27: qty 1

## 2023-12-27 MED ORDER — MILRINONE LACTATE IN DEXTROSE 20-5 MG/100ML-% IV SOLN
0.3000 ug/kg/min | INTRAVENOUS | Status: DC
  Filled 2023-12-27: qty 100

## 2023-12-27 MED ORDER — BISACODYL 5 MG PO TBEC: 5.0000 mg | DELAYED_RELEASE_TABLET | Freq: Once | ORAL | Status: DC

## 2023-12-27 MED ORDER — VANCOMYCIN HCL 1250 MG/250ML IV SOLN: 1250.0000 mg | INTRAVENOUS | Status: DC

## 2023-12-27 MED ORDER — CEFAZOLIN SODIUM-DEXTROSE 2-4 GM/100ML-% IV SOLN
2.0000 g | INTRAVENOUS | Status: DC
  Filled 2023-12-27: qty 100

## 2023-12-27 MED ORDER — TEMAZEPAM 15 MG PO CAPS: 15.0000 mg | ORAL_CAPSULE | Freq: Once | ORAL | Status: DC | PRN

## 2023-12-27 MED ORDER — EPINEPHRINE HCL 5 MG/250ML IV SOLN IN NS
0.0000 ug/min | INTRAVENOUS | Status: DC
  Filled 2023-12-27: qty 250

## 2023-12-27 MED ORDER — TRANEXAMIC ACID 1000 MG/10ML IV SOLN
1.5000 mg/kg/h | INTRAVENOUS | Status: AC
  Administered 2023-12-28: 1.5 mg/kg/h via INTRAVENOUS
  Filled 2023-12-27 (×3): qty 25

## 2023-12-27 MED ORDER — PHENYLEPHRINE HCL-NACL 20-0.9 MG/250ML-% IV SOLN
30.0000 ug/min | INTRAVENOUS | Status: AC
  Administered 2023-12-28: 40 ug/min via INTRAVENOUS
  Filled 2023-12-27: qty 250

## 2023-12-27 MED ORDER — HEPARIN 30,000 UNITS/1000 ML (OHS) CELLSAVER SOLUTION
Status: DC
  Filled 2023-12-27 (×2): qty 1000

## 2023-12-27 MED ORDER — MAGNESIUM SULFATE 50 % IJ SOLN
40.0000 meq | INTRAMUSCULAR | Status: DC
  Filled 2023-12-27: qty 9.85

## 2023-12-27 MED ORDER — CEFAZOLIN SODIUM-DEXTROSE 2-4 GM/100ML-% IV SOLN
2.0000 g | INTRAVENOUS | Status: AC
  Administered 2023-12-28 (×2): 2 g via INTRAVENOUS
  Filled 2023-12-27: qty 100

## 2023-12-27 MED ORDER — CHLORHEXIDINE GLUCONATE 0.12 % MT SOLN
15.0000 mL | Freq: Once | OROMUCOSAL | Status: AC
  Administered 2023-12-28: 15 mL via OROMUCOSAL
  Filled 2023-12-27: qty 15

## 2023-12-27 MED ORDER — CHLORHEXIDINE GLUCONATE CLOTH 2 % EX PADS
6.0000 | MEDICATED_PAD | Freq: Once | CUTANEOUS | Status: AC
  Administered 2023-12-27: 6 via TOPICAL

## 2023-12-27 MED ORDER — CHLORHEXIDINE GLUCONATE CLOTH 2 % EX PADS
6.0000 | MEDICATED_PAD | Freq: Once | CUTANEOUS | Status: AC
Start: 2023-12-27 — End: 2023-12-28
  Administered 2023-12-28: 6 via TOPICAL

## 2023-12-27 MED ORDER — MANNITOL 20 % IV SOLN
INTRAVENOUS | Status: DC
  Filled 2023-12-27: qty 13

## 2023-12-27 MED ORDER — TRANEXAMIC ACID (OHS) PUMP PRIME SOLUTION
2.0000 mg/kg | INTRAVENOUS | Status: DC
  Filled 2023-12-27: qty 1.97

## 2023-12-27 MED ORDER — INSULIN REGULAR(HUMAN) IN NACL 100-0.9 UT/100ML-% IV SOLN
INTRAVENOUS | Status: AC
  Administered 2023-12-28: .7 [IU]/h via INTRAVENOUS
  Filled 2023-12-27 (×2): qty 100

## 2023-12-27 MED ORDER — NOREPINEPHRINE 4 MG/250ML-% IV SOLN
0.0000 ug/min | INTRAVENOUS | Status: DC
  Filled 2023-12-27: qty 250

## 2023-12-27 MED ORDER — MUPIROCIN 2 % EX OINT
1.0000 | TOPICAL_OINTMENT | Freq: Two times a day (BID) | CUTANEOUS | Status: DC
Start: 2023-12-27 — End: 2023-12-27
  Filled 2023-12-27: qty 22

## 2023-12-27 MED ORDER — PLASMA-LYTE A IV SOLN
INTRAVENOUS | Status: AC
  Administered 2023-12-28: 500 mL
  Filled 2023-12-27 (×2): qty 2.5

## 2023-12-27 MED ORDER — TRANEXAMIC ACID (OHS) BOLUS VIA INFUSION
15.0000 mg/kg | INTRAVENOUS | Status: AC
  Administered 2023-12-28: 1477.5 mg via INTRAVENOUS
  Filled 2023-12-27: qty 1478

## 2023-12-27 MED ORDER — VANCOMYCIN HCL 1.5 G IV SOLR
1500.0000 mg | INTRAVENOUS | Status: AC
  Administered 2023-12-28: 1500 mg via INTRAVENOUS
  Filled 2023-12-27: qty 30

## 2023-12-27 MED ORDER — DEXMEDETOMIDINE HCL IN NACL 400 MCG/100ML IV SOLN
0.1000 ug/kg/h | INTRAVENOUS | Status: AC
  Administered 2023-12-28: .7 ug/kg/h via INTRAVENOUS
  Filled 2023-12-27: qty 100

## 2023-12-27 MED ORDER — POTASSIUM CHLORIDE 2 MEQ/ML IV SOLN
80.0000 meq | INTRAVENOUS | Status: DC
  Filled 2023-12-27: qty 40

## 2023-12-27 MED ORDER — NITROGLYCERIN IN D5W 200-5 MCG/ML-% IV SOLN
2.0000 ug/min | INTRAVENOUS | Status: DC
  Filled 2023-12-27: qty 250

## 2023-12-27 NOTE — TOC Initial Note (Signed)
 Transition of Care [facility]) - Initial/Assessment Note    Patient Details  Name: Francisco White MRN: [id number] Date of Birth: 10/09/1970  [facility] CM/SW Contact:    Cosimo Diones, RN Phone Number: 12/27/2023, 2:41 PM  Clinical Narrative:  Patient presented for unstable angina- CABG scheduled for 12-28-23. PTA patient states he was independent from home alone. Patient has support of two daughters. Case Manager will continue to follow for transition of care needs as the patient progresses.                  Expected Discharge Plan: Home w Home Health Services Barriers to Discharge: Continued Medical Work up   Patient Goals and CMS Choice Patient states their goals for this hospitalization and ongoing recovery are:: Patient wants to transition home once stable.   Choice offered to / list presented to : NA Expected Discharge Plan and Services In-house Referral: NA Discharge Planning Services: CM Consult Post Acute Care Choice: NA Living arrangements for the past 2 months: Single Family Home                   DME Agency: NA  Prior Living Arrangements/Services Living arrangements for the past 2 months: Single Family Home Lives with:: Self Patient language and need for interpreter reviewed:: Yes        Need for Family Participation in Patient Care: Yes (Comment) Care giver support system in place?: Yes (comment)   Criminal Activity/Legal Involvement Pertinent to Current Situation/Hospitalization: No - Comment as needed  Activities of Daily Living   ADL Screening (condition at time of admission) Independently performs ADLs?: Yes (appropriate for developmental age) Is the patient deaf or have difficulty hearing?: No Does the patient have difficulty seeing, even when wearing glasses/contacts?: No Does the patient have difficulty concentrating, remembering, or making decisions?: No  Permission Sought/Granted Permission sought to share information with :  Case Manager, Family Supports     Emotional Assessment Appearance:: Appears stated age Attitude/Demeanor/Rapport: Engaged Affect (typically observed): Appropriate Orientation: : Oriented to Self, Oriented to Place, Oriented to  Time, Oriented to Situation Alcohol / Substance Use: Not Applicable Psych Involvement: No (comment)  Admission diagnosis:  Unstable angina (HCC) [I20.0] Patient Active Problem List   Diagnosis Date Noted   Unstable angina (HCC) 12/24/2023   Routine general medical examination at a health care facility 09/23/2022   Syncope and collapse 01/02/2021   Skin sensation disturbance 04/07/2017   B12 deficiency 07/10/2016   HTN (hypertension) 03/26/2013   CAD (coronary artery disease) 03/26/2013   Hypercholesterolemia 03/26/2013   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 [place] [address] Phone: [phone] Fax: [phone]  [email or web address] - [facility] - [place], [place] - [address] [place] [address] Phone: [phone] Fax: [phone]  [facility] [address] [place] [address] Phone: [phone] Fax: [phone]     Social Drivers of Health (SDOH) Social History: SDOH Screenings   Food Insecurity: No Food Insecurity (12/24/2023)  Housing: Low Risk  (12/24/2023)  Transportation Needs: No Transportation Needs (12/24/2023)  Utilities: Not At Risk (12/24/2023)  Depression (PHQ2-9): High Risk (05/05/2023)  Tobacco Use: Low Risk  (12/24/2023)   SDOH Interventions:     Readmission Risk Interventions      No data to display

## 2023-12-27 NOTE — Anesthesia Preprocedure Evaluation (Addendum)
 Anesthesia Evaluation  Patient identified by MRN, date of birth, ID band Patient awake    Reviewed: Allergy & Precautions, NPO status , Patient's Chart, lab work & pertinent test results  History of Anesthesia Complications Negative for: history of anesthetic complications  Airway Mallampati: I  TM Distance: >3 FB Neck ROM: Full    Dental  (+) Dental Advisory Given   Pulmonary neg pulmonary ROS   breath sounds clear to auscultation       Cardiovascular hypertension, Pt. on medications + angina  + CAD (50% LM, severe multivessel disease)   Rhythm:Regular Rate:Normal  12/24/2023 ECHO: EF 60-65%, normal LVF, normal RVF, no significant valvular abnormalities   Neuro/Psych    Depression    negative neurological ROS     GI/Hepatic negative GI ROS, Neg liver ROS,,,  Endo/Other  negative endocrine ROS    Renal/GU negative Renal ROS     Musculoskeletal   Abdominal   Peds  Hematology Hb 14.4, plt 150k   Anesthesia Other Findings   Reproductive/Obstetrics                             Anesthesia Physical Anesthesia Plan  ASA: 4  Anesthesia Plan: General   Post-op Pain Management:    Induction: Intravenous  PONV Risk Score and Plan: 2 and Treatment may vary due to age or medical condition  Airway Management Planned: Oral ETT  Additional Equipment: Arterial line, CVP, TEE and Ultrasound Guidance Line Placement  Intra-op Plan:   Post-operative Plan: Post-operative intubation/ventilation  Informed Consent: I have reviewed the patients History and Physical, chart, labs and discussed the procedure including the risks, benefits and alternatives for the proposed anesthesia with the patient or authorized representative who has indicated his/her understanding and acceptance.     Dental advisory given  Plan Discussed with: CRNA and Surgeon  Anesthesia Plan Comments:        Anesthesia  Quick Evaluation

## 2023-12-27 NOTE — Progress Notes (Signed)
 PHARMACY - ANTICOAGULATION CONSULT NOTE  Pharmacy Consult for IV heparin  Indication: CAD awaiting CABG decision  Allergies  Allergen Reactions   Simvastatin Other (See Comments)    Myalgias    Olmesartan  Other (See Comments)    Dizziness, lightheadedness     Patient Measurements: Height: 6\' 2"  (188 cm) Weight: 98.5 kg (217 lb 1.6 oz) IBW/kg (Calculated) : 82.2 HEPARIN  DW (KG): 101.6  Vital Signs: Temp: 98 F (36.7 C) (06/02 0442) Temp Source: Oral (06/02 0442) BP: 113/64 (06/02 0442) Pulse Rate: 79 (06/02 0442)  Labs: Recent Labs    12/25/23 0452 12/25/23 0812 12/25/23 1608 12/26/23 0443 12/27/23 0539  HGB 13.8  --   --  13.1 13.7  HCT 40.8  --   --  38.7* 40.7  PLT 143*  --   --  139* 135*  HEPARINUNFRC 0.39   < > 0.44 0.63 0.52  CREATININE  --   --   --  1.00 1.05   < > = values in this interval not displayed.    Estimated Creatinine Clearance: 92.4 mL/min (by C-G formula based on SCr of 1.05 mg/dL).   Medical History: Past Medical History:  Diagnosis Date   CAD (coronary artery disease)    Depression 03/26/2013   Family history of heart disease    History of nuclear stress test 06/2011   exercise; negative for ischemia, normal pattern of perfusion    HTN (hypertension) 03/26/2013   Hyperlipidemia    Hypertension    Skin sensation disturbance 04/07/2017   Umbilical hernia 03/26/2013    Medications:  Infusions:   heparin  1,450 Units/hr (12/27/23 0420)   Assessment: 53 yo male admitted s/p cath with multivessel CAD.  Pharmacy asked to start IV heparin  while awaiting a decision on CABG.  Heparin  to start 2 hrs after TR band off -removed at 2 pm.  - Heparin  level remains therapeutic at 0.52 while on 1450 units/hr.  Noted plans for CABG on 12/28/23.   Goal of Therapy:  Heparin  level 0.3-0.7 units/ml Monitor platelets by anticoagulation protocol: Yes   Plan:  Continue IV heparin  at 1450 units/hr. Daily heparin  level and CBC CABG 6/3  Baxter Limber,  PharmD Clinical Pharmacist **Pharmacist phone directory can now be found on [email or web address] ([email or web address]).  Listed under [facility].

## 2023-12-27 NOTE — Progress Notes (Signed)
   Rounding Note    Patient Name: Francisco White Date of Encounter: 12/27/2023  [facility] Cardiologist: Luana Rumple, MD   Subjective   No CP this AM.  Vital Signs    Vitals:   12/26/23 2242 12/27/23 0155 12/27/23 0442 12/27/23 0816  BP: 114/74 (!) 93/54 113/64 124/76  Pulse: (!) 42 (!) 45 79 (!) 54  Resp: 16 18 16    Temp: 97.6 F (36.4 C) 97.6 F (36.4 C) 98 F (36.7 C) 97.6 F (36.4 C)  TempSrc: Oral Oral Oral Oral  SpO2: 96% 96% 99%   Weight:      Height:        Intake/Output Summary (Last 24 hours) at 12/27/2023 0955 Last data filed at 12/27/2023 0420 Gross per 24 hour  Intake 574.8 ml  Output --  Net 574.8 ml      12/24/2023    1:12 PM 12/24/2023    8:48 AM 12/22/2023    3:13 PM  Last 3 Weights  Weight (lbs) 217 lb 1.6 oz 224 lb 224 lb 8 oz  Weight (kg) 98.476 kg 101.606 kg 101.833 kg      Telemetry    Personally Reviewed  Physical Exam   GEN: No acute distress.   Respiratory: clear lungs CV RRR no gallop or murmur Psych: Normal affect   Assessment & Plan    CAD with USAP. Multivessel severe disease on cardiac cath. Seen by Dr Deloise Ferries. Plan CABG tomorrow. Echo report pending. I reviewed. No significant valve disease. EF looks good. Hemodynamically and electrically stable. Continue Aspirin  and Heparin  Continue imdur  Cont statin      Jerral Mccauley [place] MD, FACC 12/27/2023 9:57 AM

## 2023-12-27 NOTE — Progress Notes (Signed)
 CARDIAC REHAB PHASE I   Pre-op OHS education completed. All questions and concerns addressed. Will continue to follow.   Ronny Colas, RN BSN 12/27/2023 12:47 PM

## 2023-12-27 NOTE — Progress Notes (Signed)
   12/27/23 0155  Assess: MEWS Score  Temp 97.6 F (36.4 C)  BP (!) 93/54  MAP (mmHg) 65  Pulse Rate (!) 45  ECG Heart Rate (!) 47  Resp 18  Level of Consciousness Alert  SpO2 96 %  O2 Device Room Air  Assess: MEWS Score  MEWS Temp 0  MEWS Systolic 1  MEWS Pulse 1  MEWS RR 0  MEWS LOC 0  MEWS Score 2  MEWS Score Color Yellow  Assess: if the MEWS score is Yellow or Red  Were vital signs accurate and taken at a resting state? Yes  Does the patient meet 2 or more of the SIRS criteria? No  MEWS guidelines implemented  Yes, yellow  Treat  MEWS Interventions Considered administering scheduled or prn medications/treatments as ordered  Take Vital Signs  Increase Vital Sign Frequency  Yellow: Q2hr x1, continue Q4hrs until patient remains green for 12hrs  Escalate  MEWS: Escalate Yellow: Discuss with charge nurse and consider notifying provider and/or RRT  Notify: Charge Nurse/RN  Name of Charge Nurse/RN Notified [place], RN  Assess: SIRS CRITERIA  SIRS Temperature  0  SIRS Respirations  0  SIRS Pulse 0  SIRS WBC 0  SIRS Score Sum  0

## 2023-12-28 ENCOUNTER — Other Ambulatory Visit: Payer: Self-pay

## 2023-12-28 ENCOUNTER — Inpatient Hospital Stay (HOSPITAL_COMMUNITY)

## 2023-12-28 ENCOUNTER — Inpatient Hospital Stay (HOSPITAL_COMMUNITY): Admitting: Certified Registered Nurse Anesthetist

## 2023-12-28 ENCOUNTER — Inpatient Hospital Stay (HOSPITAL_COMMUNITY)
Admission: RE | Disposition: A | Discharge status: Home / Self Care | Payer: Self-pay | Source: Home / Self Care | Attending: Internal Medicine

## 2023-12-28 DIAGNOSIS — I10 Essential (primary) hypertension: Secondary | ICD-10-CM

## 2023-12-28 DIAGNOSIS — E7800 Pure hypercholesterolemia, unspecified: Secondary | ICD-10-CM | POA: Diagnosis not present

## 2023-12-28 DIAGNOSIS — I25110 Atherosclerotic heart disease of native coronary artery with unstable angina pectoris: Secondary | ICD-10-CM | POA: Diagnosis not present

## 2023-12-28 DIAGNOSIS — E785 Hyperlipidemia, unspecified: Secondary | ICD-10-CM

## 2023-12-28 DIAGNOSIS — F32A Depression, unspecified: Secondary | ICD-10-CM

## 2023-12-28 DIAGNOSIS — Z951 Presence of aortocoronary bypass graft: Secondary | ICD-10-CM

## 2023-12-28 DIAGNOSIS — D696 Thrombocytopenia, unspecified: Secondary | ICD-10-CM

## 2023-12-28 HISTORY — PX: RADIAL ARTERY HARVEST: SHX5067

## 2023-12-28 HISTORY — PX: INTRAOPERATIVE TRANSESOPHAGEAL ECHOCARDIOGRAM: SHX5062

## 2023-12-28 HISTORY — PX: CORONARY ARTERY BYPASS GRAFT: SHX141

## 2023-12-28 LAB — GLUCOSE, CAPILLARY
Glucose-Capillary: 121 mg/dL — ABNORMAL HIGH (ref 70–99)
Glucose-Capillary: 124 mg/dL — ABNORMAL HIGH (ref 70–99)
Glucose-Capillary: 119 mg/dL — ABNORMAL HIGH (ref 70–99)
Glucose-Capillary: 115 mg/dL — ABNORMAL HIGH (ref 70–99)
Glucose-Capillary: 114 mg/dL — ABNORMAL HIGH (ref 70–99)
Glucose-Capillary: 128 mg/dL — ABNORMAL HIGH (ref 70–99)
Glucose-Capillary: 120 mg/dL — ABNORMAL HIGH (ref 70–99)
Glucose-Capillary: 100 mg/dL — ABNORMAL HIGH (ref 70–99)

## 2023-12-28 LAB — POCT I-STAT 7, (LYTES, BLD GAS, ICA,H+H)
Acid-base deficit: 3 mmol/L — ABNORMAL HIGH (ref 0.0–2.0)
Acid-base deficit: 2 mmol/L (ref 0.0–2.0)
Acid-base deficit: 3 mmol/L — ABNORMAL HIGH (ref 0.0–2.0)
Acid-base deficit: 3 mmol/L — ABNORMAL HIGH (ref 0.0–2.0)
Acid-base deficit: 5 mmol/L — ABNORMAL HIGH (ref 0.0–2.0)
Acid-base deficit: 3 mmol/L — ABNORMAL HIGH (ref 0.0–2.0)
Bicarbonate: 22.7 mmol/L (ref 20.0–28.0)
Bicarbonate: 21.8 mmol/L (ref 20.0–28.0)
Bicarbonate: 22.6 mmol/L (ref 20.0–28.0)
Bicarbonate: 22 mmol/L (ref 20.0–28.0)
Bicarbonate: 20.7 mmol/L (ref 20.0–28.0)
Bicarbonate: 23.2 mmol/L (ref 20.0–28.0)
Calcium, Ion: 1.28 mmol/L (ref 1.15–1.40)
Calcium, Ion: 1.14 mmol/L — ABNORMAL LOW (ref 1.15–1.40)
Calcium, Ion: 1.35 mmol/L (ref 1.15–1.40)
Calcium, Ion: 1.27 mmol/L (ref 1.15–1.40)
Calcium, Ion: 1.23 mmol/L (ref 1.15–1.40)
Calcium, Ion: 1.16 mmol/L (ref 1.15–1.40)
HCT: 39 % (ref 39.0–52.0)
HCT: 29 % — ABNORMAL LOW (ref 39.0–52.0)
HCT: 32 % — ABNORMAL LOW (ref 39.0–52.0)
HCT: 34 % — ABNORMAL LOW (ref 39.0–52.0)
HCT: 32 % — ABNORMAL LOW (ref 39.0–52.0)
HCT: 32 % — ABNORMAL LOW (ref 39.0–52.0)
Hemoglobin: 13.3 g/dL (ref 13.0–17.0)
Hemoglobin: 10.9 g/dL — ABNORMAL LOW (ref 13.0–17.0)
Hemoglobin: 9.9 g/dL — ABNORMAL LOW (ref 13.0–17.0)
Hemoglobin: 11.6 g/dL — ABNORMAL LOW (ref 13.0–17.0)
Hemoglobin: 10.9 g/dL — ABNORMAL LOW (ref 13.0–17.0)
Hemoglobin: 10.9 g/dL — ABNORMAL LOW (ref 13.0–17.0)
O2 Saturation: 100 %
O2 Saturation: 100 %
O2 Saturation: 100 %
O2 Saturation: 96 %
O2 Saturation: 97 %
O2 Saturation: 91 %
Patient temperature: 37.9
Patient temperature: 37.9
Potassium: 4 mmol/L (ref 3.5–5.1)
Potassium: 4.2 mmol/L (ref 3.5–5.1)
Potassium: 5 mmol/L (ref 3.5–5.1)
Potassium: 3.7 mmol/L (ref 3.5–5.1)
Potassium: 3.8 mmol/L (ref 3.5–5.1)
Potassium: 3.5 mmol/L (ref 3.5–5.1)
Sodium: 140 mmol/L (ref 135–145)
Sodium: 139 mmol/L (ref 135–145)
Sodium: 142 mmol/L (ref 135–145)
Sodium: 141 mmol/L (ref 135–145)
Sodium: 145 mmol/L (ref 135–145)
Sodium: 145 mmol/L (ref 135–145)
TCO2: 24 mmol/L (ref 22–32)
TCO2: 23 mmol/L (ref 22–32)
TCO2: 24 mmol/L (ref 22–32)
TCO2: 23 mmol/L (ref 22–32)
TCO2: 22 mmol/L (ref 22–32)
TCO2: 25 mmol/L (ref 22–32)
pCO2 arterial: 43.5 mmHg (ref 32–48)
pCO2 arterial: 32.1 mmHg (ref 32–48)
pCO2 arterial: 40.7 mmHg (ref 32–48)
pCO2 arterial: 37.9 mmHg (ref 32–48)
pCO2 arterial: 40.8 mmHg (ref 32–48)
pCO2 arterial: 47.7 mmHg (ref 32–48)
pH, Arterial: 7.326 — ABNORMAL LOW (ref 7.35–7.45)
pH, Arterial: 7.353 (ref 7.35–7.45)
pH, Arterial: 7.439 (ref 7.35–7.45)
pH, Arterial: 7.372 (ref 7.35–7.45)
pH, Arterial: 7.318 — ABNORMAL LOW (ref 7.35–7.45)
pH, Arterial: 7.299 — ABNORMAL LOW (ref 7.35–7.45)
pO2, Arterial: 514 mmHg — ABNORMAL HIGH (ref 83–108)
pO2, Arterial: 321 mmHg — ABNORMAL HIGH (ref 83–108)
pO2, Arterial: 376 mmHg — ABNORMAL HIGH (ref 83–108)
pO2, Arterial: 84 mmHg (ref 83–108)
pO2, Arterial: 98 mmHg (ref 83–108)
pO2, Arterial: 70 mmHg — ABNORMAL LOW (ref 83–108)

## 2023-12-28 LAB — POCT I-STAT EG7
Acid-base deficit: 2 mmol/L (ref 0.0–2.0)
Bicarbonate: 23.4 mmol/L (ref 20.0–28.0)
Calcium, Ion: 1.12 mmol/L — ABNORMAL LOW (ref 1.15–1.40)
HCT: 30 % — ABNORMAL LOW (ref 39.0–52.0)
Hemoglobin: 10.2 g/dL — ABNORMAL LOW (ref 13.0–17.0)
O2 Saturation: 83 %
Potassium: 4.2 mmol/L (ref 3.5–5.1)
Sodium: 142 mmol/L (ref 135–145)
TCO2: 25 mmol/L (ref 22–32)
pCO2, Ven: 39.7 mmHg — ABNORMAL LOW (ref 44–60)
pH, Ven: 7.378 (ref 7.25–7.43)
pO2, Ven: 48 mmHg — ABNORMAL HIGH (ref 32–45)

## 2023-12-28 LAB — BASIC METABOLIC PANEL WITH GFR
Anion gap: 7 (ref 5–15)
Anion gap: 9 (ref 5–15)
BUN: 16 mg/dL (ref 6–20)
BUN: 12 mg/dL (ref 6–20)
CO2: 24 mmol/L (ref 22–32)
CO2: 24 mmol/L (ref 22–32)
Calcium: 9.3 mg/dL (ref 8.9–10.3)
Calcium: 8 mg/dL — ABNORMAL LOW (ref 8.9–10.3)
Chloride: 109 mmol/L (ref 98–111)
Chloride: 108 mmol/L (ref 98–111)
Creatinine, Ser: 1.06 mg/dL (ref 0.61–1.24)
Creatinine, Ser: 0.91 mg/dL (ref 0.61–1.24)
GFR, Estimated: >60 mL/min (ref >60)
GFR, Estimated: >60 mL/min (ref >60)
Glucose, Bld: 88 mg/dL (ref 70–99)
Glucose, Bld: 108 mg/dL — ABNORMAL HIGH (ref 70–99)
Potassium: 4 mmol/L (ref 3.5–5.1)
Potassium: 3.6 mmol/L (ref 3.5–5.1)
Sodium: 140 mmol/L (ref 135–145)
Sodium: 141 mmol/L (ref 135–145)

## 2023-12-28 LAB — POCT I-STAT, CHEM 8
BUN: 15 mg/dL (ref 6–20)
BUN: 13 mg/dL (ref 6–20)
BUN: 12 mg/dL (ref 6–20)
BUN: 12 mg/dL (ref 6–20)
Calcium, Ion: 1.3 mmol/L (ref 1.15–1.40)
Calcium, Ion: 1.33 mmol/L (ref 1.15–1.40)
Calcium, Ion: 1.14 mmol/L — ABNORMAL LOW (ref 1.15–1.40)
Calcium, Ion: 1.37 mmol/L (ref 1.15–1.40)
Chloride: 106 mmol/L (ref 98–111)
Chloride: 103 mmol/L (ref 98–111)
Chloride: 108 mmol/L (ref 98–111)
Chloride: 108 mmol/L (ref 98–111)
Creatinine, Ser: 0.9 mg/dL (ref 0.61–1.24)
Creatinine, Ser: 0.9 mg/dL (ref 0.61–1.24)
Creatinine, Ser: 0.8 mg/dL (ref 0.61–1.24)
Creatinine, Ser: 0.9 mg/dL (ref 0.61–1.24)
Glucose, Bld: 92 mg/dL (ref 70–99)
Glucose, Bld: 100 mg/dL — ABNORMAL HIGH (ref 70–99)
Glucose, Bld: 96 mg/dL (ref 70–99)
Glucose, Bld: 100 mg/dL — ABNORMAL HIGH (ref 70–99)
HCT: 38 % — ABNORMAL LOW (ref 39.0–52.0)
HCT: 40 % (ref 39.0–52.0)
HCT: 30 % — ABNORMAL LOW (ref 39.0–52.0)
HCT: 30 % — ABNORMAL LOW (ref 39.0–52.0)
Hemoglobin: 12.9 g/dL — ABNORMAL LOW (ref 13.0–17.0)
Hemoglobin: 13.6 g/dL (ref 13.0–17.0)
Hemoglobin: 10.2 g/dL — ABNORMAL LOW (ref 13.0–17.0)
Hemoglobin: 10.2 g/dL — ABNORMAL LOW (ref 13.0–17.0)
Potassium: 4 mmol/L (ref 3.5–5.1)
Potassium: 4 mmol/L (ref 3.5–5.1)
Potassium: 4.7 mmol/L (ref 3.5–5.1)
Potassium: 4.2 mmol/L (ref 3.5–5.1)
Sodium: 140 mmol/L (ref 135–145)
Sodium: 142 mmol/L (ref 135–145)
Sodium: 139 mmol/L (ref 135–145)
Sodium: 142 mmol/L (ref 135–145)
TCO2: 28 mmol/L (ref 22–32)
TCO2: 24 mmol/L (ref 22–32)
TCO2: 25 mmol/L (ref 22–32)
TCO2: 26 mmol/L (ref 22–32)

## 2023-12-28 LAB — CBC
HCT: 42.6 % (ref 39.0–52.0)
HCT: 37.1 % — ABNORMAL LOW (ref 39.0–52.0)
HCT: 34 % — ABNORMAL LOW (ref 39.0–52.0)
Hemoglobin: 14.4 g/dL (ref 13.0–17.0)
Hemoglobin: 12.5 g/dL — ABNORMAL LOW (ref 13.0–17.0)
Hemoglobin: 11.5 g/dL — ABNORMAL LOW (ref 13.0–17.0)
MCH: 30.4 pg (ref 26.0–34.0)
MCH: 30.3 pg (ref 26.0–34.0)
MCH: 30.5 pg (ref 26.0–34.0)
MCHC: 33.8 g/dL (ref 30.0–36.0)
MCHC: 33.7 g/dL (ref 30.0–36.0)
MCHC: 33.8 g/dL (ref 30.0–36.0)
MCV: 89.9 fL (ref 80.0–100.0)
MCV: 89.8 fL (ref 80.0–100.0)
MCV: 90.2 fL (ref 80.0–100.0)
Platelets: 150 10*3/uL (ref 150–400)
Platelets: 105 10*3/uL — ABNORMAL LOW (ref 150–400)
Platelets: 111 10*3/uL — ABNORMAL LOW (ref 150–400)
RBC: 4.74 MIL/uL (ref 4.22–5.81)
RBC: 4.13 MIL/uL — ABNORMAL LOW (ref 4.22–5.81)
RBC: 3.77 MIL/uL — ABNORMAL LOW (ref 4.22–5.81)
RDW: 12.1 % (ref 11.5–15.5)
RDW: 12.1 % (ref 11.5–15.5)
RDW: 12 % (ref 11.5–15.5)
WBC: 7.9 10*3/uL (ref 4.0–10.5)
WBC: 12.3 10*3/uL — ABNORMAL HIGH (ref 4.0–10.5)
WBC: 13.4 10*3/uL — ABNORMAL HIGH (ref 4.0–10.5)
nRBC: 0 % (ref 0.0–0.2)
nRBC: 0 % (ref 0.0–0.2)
nRBC: 0 % (ref 0.0–0.2)

## 2023-12-28 LAB — ECHO INTRAOPERATIVE TEE
AV Mean grad: 3 mmHg
AV Peak grad: 6.3 mmHg
Ao pk vel: 1.25 m/s
Height: 74 in
S' Lateral: 3.9 cm
Weight: 3473.6 [oz_av]

## 2023-12-28 LAB — MAGNESIUM: Magnesium: 2.7 mg/dL — ABNORMAL HIGH (ref 1.7–2.4)

## 2023-12-28 LAB — HEMOGLOBIN AND HEMATOCRIT, BLOOD
HCT: 31.7 % — ABNORMAL LOW (ref 39.0–52.0)
Hemoglobin: 10.9 g/dL — ABNORMAL LOW (ref 13.0–17.0)

## 2023-12-28 LAB — APTT: aPTT: 29 s (ref 24–36)

## 2023-12-28 LAB — PROTIME-INR
INR: 1.4 — ABNORMAL HIGH (ref 0.8–1.2)
Prothrombin Time: 17.5 s — ABNORMAL HIGH (ref 11.4–15.2)

## 2023-12-28 LAB — HEPARIN LEVEL (UNFRACTIONATED): Heparin Unfractionated: 0.45 [IU]/mL (ref 0.30–0.70)

## 2023-12-28 LAB — PLATELET COUNT: Platelets: 118 10*3/uL — ABNORMAL LOW (ref 150–400)

## 2023-12-28 SURGERY — CORONARY ARTERY BYPASS GRAFTING (CABG)
Anesthesia: General | Site: Chest

## 2023-12-28 MED ORDER — LACTATED RINGERS IV SOLN: INTRAVENOUS | Status: DC | PRN

## 2023-12-28 MED ORDER — ROCURONIUM BROMIDE 10 MG/ML (PF) SYRINGE
PREFILLED_SYRINGE | INTRAVENOUS | Status: DC | PRN
  Administered 2023-12-28: 20 mg via INTRAVENOUS
  Administered 2023-12-28: 50 mg via INTRAVENOUS
  Administered 2023-12-28: 70 mg via INTRAVENOUS
  Administered 2023-12-28: 30 mg via INTRAVENOUS

## 2023-12-28 MED ORDER — BISACODYL 5 MG PO TBEC
10.0000 mg | DELAYED_RELEASE_TABLET | Freq: Every day | ORAL | Status: DC
  Administered 2023-12-29 – 2023-12-31 (×3): 10 mg via ORAL
  Filled 2023-12-28 (×3): qty 2

## 2023-12-28 MED ORDER — SODIUM BICARBONATE 8.4 % IV SOLN
100.0000 meq | Freq: Once | INTRAVENOUS | Status: AC
  Administered 2023-12-28: 100 meq via INTRAVENOUS

## 2023-12-28 MED ORDER — PROPOFOL 10 MG/ML IV BOLUS
INTRAVENOUS | Status: AC
Start: 2023-12-28 — End: ?
  Filled 2023-12-28: qty 20

## 2023-12-28 MED ORDER — ALBUMIN HUMAN 5 % IV SOLN
250.0000 mL | INTRAVENOUS | Status: AC | PRN
  Administered 2023-12-28 (×5): 12.5 g via INTRAVENOUS
  Filled 2023-12-28 (×2): qty 250

## 2023-12-28 MED ORDER — SODIUM CHLORIDE 0.9% FLUSH: 3.0000 mL | INTRAVENOUS | Status: DC | PRN

## 2023-12-28 MED ORDER — DEXTROSE 50 % IV SOLN: 0.0000 mL | INTRAVENOUS | Status: DC | PRN

## 2023-12-28 MED ORDER — MIDAZOLAM HCL (PF) 5 MG/ML IJ SOLN
INTRAMUSCULAR | Status: DC | PRN
  Administered 2023-12-28: 2 mg via INTRAVENOUS
  Administered 2023-12-28: 3 mg via INTRAVENOUS
  Administered 2023-12-28: 2 mg via INTRAVENOUS
  Administered 2023-12-28: 1 mg via INTRAVENOUS

## 2023-12-28 MED ORDER — LACTATED RINGERS IV SOLN: INTRAVENOUS | Status: DC

## 2023-12-28 MED ORDER — PANTOPRAZOLE SODIUM 40 MG PO TBEC
40.0000 mg | DELAYED_RELEASE_TABLET | Freq: Every day | ORAL | Status: DC
  Administered 2023-12-30 – 2024-01-01 (×3): 40 mg via ORAL
  Filled 2023-12-28 (×3): qty 1

## 2023-12-28 MED ORDER — METOPROLOL TARTRATE 12.5 MG HALF TABLET
12.5000 mg | ORAL_TABLET | Freq: Two times a day (BID) | ORAL | Status: DC
  Administered 2023-12-29 – 2024-01-01 (×7): 12.5 mg via ORAL
  Filled 2023-12-28 (×7): qty 1

## 2023-12-28 MED ORDER — PHENYLEPHRINE HCL-NACL 20-0.9 MG/250ML-% IV SOLN
0.0000 ug/min | INTRAVENOUS | Status: DC
  Administered 2023-12-29: 30 ug/min via INTRAVENOUS
  Filled 2023-12-28: qty 250

## 2023-12-28 MED ORDER — MAGNESIUM SULFATE 4 GM/100ML IV SOLN
4.0000 g | Freq: Once | INTRAVENOUS | Status: AC
  Administered 2023-12-28: 4 g via INTRAVENOUS
  Filled 2023-12-28: qty 100

## 2023-12-28 MED ORDER — MORPHINE SULFATE (PF) 2 MG/ML IV SOLN
1.0000 mg | INTRAVENOUS | Status: DC | PRN
  Administered 2023-12-28 (×3): 2 mg via INTRAVENOUS
  Filled 2023-12-28 (×3): qty 1

## 2023-12-28 MED ORDER — CEFAZOLIN SODIUM-DEXTROSE 2-4 GM/100ML-% IV SOLN
2.0000 g | Freq: Three times a day (TID) | INTRAVENOUS | Status: AC
  Administered 2023-12-28 – 2023-12-30 (×6): 2 g via INTRAVENOUS
  Filled 2023-12-28 (×6): qty 100

## 2023-12-28 MED ORDER — BISACODYL 10 MG RE SUPP: 10.0000 mg | Freq: Every day | RECTAL | Status: DC

## 2023-12-28 MED ORDER — ONDANSETRON HCL 4 MG/2ML IJ SOLN
4.0000 mg | Freq: Four times a day (QID) | INTRAMUSCULAR | Status: DC | PRN
  Administered 2023-12-29 – 2023-12-31 (×4): 4 mg via INTRAVENOUS
  Filled 2023-12-28 (×5): qty 2

## 2023-12-28 MED ORDER — ASPIRIN 325 MG PO TBEC
325.0000 mg | DELAYED_RELEASE_TABLET | Freq: Every day | ORAL | Status: DC
  Administered 2023-12-29 – 2024-01-01 (×4): 325 mg via ORAL
  Filled 2023-12-28 (×4): qty 1

## 2023-12-28 MED ORDER — HEPARIN SODIUM (PORCINE) 1000 UNIT/ML IJ SOLN
INTRAMUSCULAR | Status: AC
  Filled 2023-12-28: qty 10

## 2023-12-28 MED ORDER — MIDAZOLAM HCL 2 MG/2ML IJ SOLN: 2.0000 mg | INTRAMUSCULAR | Status: DC | PRN

## 2023-12-28 MED ORDER — DOCUSATE SODIUM 100 MG PO CAPS
200.0000 mg | ORAL_CAPSULE | Freq: Every day | ORAL | Status: DC
  Administered 2023-12-29 – 2024-01-01 (×4): 200 mg via ORAL
  Filled 2023-12-28 (×4): qty 2

## 2023-12-28 MED ORDER — SODIUM CHLORIDE 0.9 % IV SOLN: 250.0000 mL | INTRAVENOUS | Status: DC

## 2023-12-28 MED ORDER — POTASSIUM CHLORIDE CRYS ER 20 MEQ PO TBCR
20.0000 meq | EXTENDED_RELEASE_TABLET | ORAL | Status: AC
  Administered 2023-12-28 – 2023-12-29 (×3): 20 meq via ORAL
  Filled 2023-12-28 (×3): qty 1

## 2023-12-28 MED ORDER — ASPIRIN 81 MG PO CHEW
324.0000 mg | CHEWABLE_TABLET | Freq: Every day | ORAL | Status: DC
  Filled 2023-12-28: qty 4

## 2023-12-28 MED ORDER — PHENYLEPHRINE 80 MCG/ML (10ML) SYRINGE FOR IV PUSH (FOR BLOOD PRESSURE SUPPORT)
PREFILLED_SYRINGE | INTRAVENOUS | Status: DC | PRN
  Administered 2023-12-28: 40 ug via INTRAVENOUS

## 2023-12-28 MED ORDER — OXYCODONE HCL 5 MG PO TABS
5.0000 mg | ORAL_TABLET | ORAL | Status: DC | PRN
  Administered 2023-12-29: 5 mg via ORAL
  Administered 2023-12-30 (×2): 10 mg via ORAL
  Filled 2023-12-28: qty 1
  Filled 2023-12-28 (×2): qty 2

## 2023-12-28 MED ORDER — HEPARIN SODIUM (PORCINE) 1000 UNIT/ML IJ SOLN
INTRAMUSCULAR | Status: AC
  Filled 2023-12-28: qty 1

## 2023-12-28 MED ORDER — METOPROLOL TARTRATE 5 MG/5ML IV SOLN: 2.5000 mg | INTRAVENOUS | Status: DC | PRN

## 2023-12-28 MED ORDER — NICARDIPINE HCL IN NACL 20-0.86 MG/200ML-% IV SOLN
0.0000 mg/h | INTRAVENOUS | Status: DC
  Administered 2023-12-28: 1 mg/h via INTRAVENOUS
  Filled 2023-12-28: qty 200

## 2023-12-28 MED ORDER — PROTAMINE SULFATE 10 MG/ML IV SOLN
INTRAVENOUS | Status: AC
  Filled 2023-12-28: qty 25

## 2023-12-28 MED ORDER — METOCLOPRAMIDE HCL 5 MG/ML IJ SOLN
10.0000 mg | Freq: Four times a day (QID) | INTRAMUSCULAR | Status: AC
Start: 2023-12-28 — End: 2023-12-29
  Administered 2023-12-28 – 2023-12-29 (×6): 10 mg via INTRAVENOUS
  Filled 2023-12-28 (×6): qty 2

## 2023-12-28 MED ORDER — PROTAMINE SULFATE 10 MG/ML IV SOLN
INTRAVENOUS | Status: AC
Start: 2023-12-28 — End: ?
  Filled 2023-12-28: qty 10

## 2023-12-28 MED ORDER — ACETAMINOPHEN 500 MG PO TABS
1000.0000 mg | ORAL_TABLET | Freq: Four times a day (QID) | ORAL | Status: DC
  Administered 2023-12-28 – 2023-12-31 (×10): 1000 mg via ORAL
  Filled 2023-12-28 (×11): qty 2

## 2023-12-28 MED ORDER — ACETAMINOPHEN 160 MG/5ML PO SOLN
650.0000 mg | Freq: Once | ORAL | Status: AC
  Administered 2023-12-28: 650 mg
  Filled 2023-12-28: qty 20.3

## 2023-12-28 MED ORDER — ASPIRIN 81 MG PO CHEW
324.0000 mg | CHEWABLE_TABLET | Freq: Once | ORAL | Status: AC
  Administered 2023-12-28: 324 mg via ORAL
  Filled 2023-12-28: qty 4

## 2023-12-28 MED ORDER — SODIUM CHLORIDE 0.9 % IV SOLN: INTRAVENOUS | Status: DC

## 2023-12-28 MED ORDER — SODIUM CHLORIDE 0.9 % IV SOLN: INTRAVENOUS | Status: DC | PRN

## 2023-12-28 MED ORDER — VANCOMYCIN HCL IN DEXTROSE 1-5 GM/200ML-% IV SOLN
1000.0000 mg | Freq: Once | INTRAVENOUS | Status: AC
  Administered 2023-12-28: 1000 mg via INTRAVENOUS
  Filled 2023-12-28: qty 200

## 2023-12-28 MED ORDER — FENTANYL CITRATE (PF) 250 MCG/5ML IJ SOLN
INTRAMUSCULAR | Status: AC
Start: 2023-12-28 — End: ?
  Filled 2023-12-28: qty 5

## 2023-12-28 MED ORDER — CHLORHEXIDINE GLUCONATE 0.12 % MT SOLN
15.0000 mL | OROMUCOSAL | Status: AC
  Administered 2023-12-28: 15 mL via OROMUCOSAL
  Filled 2023-12-28: qty 15

## 2023-12-28 MED ORDER — METHADONE HCL IV SYRINGE 10 MG/ML FOR CABG
0.3000 mg/kg | Freq: Once | INTRAMUSCULAR | Status: AC
  Administered 2023-12-28: 25 mg via INTRAVENOUS
  Filled 2023-12-28: qty 2.5

## 2023-12-28 MED ORDER — PROPOFOL 10 MG/ML IV BOLUS
INTRAVENOUS | Status: DC | PRN
  Administered 2023-12-28: 30 mg via INTRAVENOUS

## 2023-12-28 MED ORDER — HEPARIN SODIUM (PORCINE) 1000 UNIT/ML IJ SOLN
INTRAMUSCULAR | Status: DC | PRN
  Administered 2023-12-28: 2000 [IU] via INTRAVENOUS
  Administered 2023-12-28: 34000 [IU] via INTRAVENOUS

## 2023-12-28 MED ORDER — DEXMEDETOMIDINE HCL IN NACL 400 MCG/100ML IV SOLN
0.0000 ug/kg/h | INTRAVENOUS | Status: DC
  Administered 2023-12-28: 0.7 ug/kg/h via INTRAVENOUS

## 2023-12-28 MED ORDER — GELATIN ABSORBABLE MT POWD
OROMUCOSAL | Status: DC | PRN
  Administered 2023-12-28 (×2): 4 mL via TOPICAL

## 2023-12-28 MED ORDER — NICARDIPINE HCL IN NACL 20-0.86 MG/200ML-% IV SOLN
INTRAVENOUS | Status: DC | PRN
Start: 2023-12-28 — End: 2023-12-28
  Administered 2023-12-28: 2.5 mg/h via INTRAVENOUS

## 2023-12-28 MED ORDER — 0.9 % SODIUM CHLORIDE (POUR BTL) OPTIME
TOPICAL | Status: DC | PRN
  Administered 2023-12-28: 5000 mL

## 2023-12-28 MED ORDER — SODIUM CHLORIDE 0.45 % IV SOLN: INTRAVENOUS | Status: DC | PRN

## 2023-12-28 MED ORDER — CHLORHEXIDINE GLUCONATE CLOTH 2 % EX PADS
6.0000 | MEDICATED_PAD | Freq: Every day | CUTANEOUS | Status: DC
  Administered 2023-12-28 – 2024-01-01 (×5): 6 via TOPICAL

## 2023-12-28 MED ORDER — TRAMADOL HCL 50 MG PO TABS
50.0000 mg | ORAL_TABLET | ORAL | Status: DC | PRN
  Administered 2023-12-28 – 2023-12-29 (×3): 50 mg via ORAL
  Administered 2023-12-30: 100 mg via ORAL
  Filled 2023-12-28 (×4): qty 1
  Filled 2023-12-28: qty 2

## 2023-12-28 MED ORDER — ACETAMINOPHEN 160 MG/5ML PO SOLN
1000.0000 mg | Freq: Four times a day (QID) | ORAL | Status: DC
  Filled 2023-12-28: qty 40.6

## 2023-12-28 MED ORDER — ALBUMIN HUMAN 5 % IV SOLN: INTRAVENOUS | Status: DC | PRN

## 2023-12-28 MED ORDER — SODIUM CHLORIDE 0.9% FLUSH
3.0000 mL | Freq: Two times a day (BID) | INTRAVENOUS | Status: DC
Start: 2023-12-29 — End: 2023-12-29
  Administered 2023-12-29: 3 mL via INTRAVENOUS

## 2023-12-28 MED ORDER — PANTOPRAZOLE SODIUM 40 MG IV SOLR
40.0000 mg | Freq: Every day | INTRAVENOUS | Status: AC
  Administered 2023-12-28 – 2023-12-29 (×2): 40 mg via INTRAVENOUS
  Filled 2023-12-28 (×2): qty 10

## 2023-12-28 MED ORDER — PROTAMINE SULFATE 10 MG/ML IV SOLN
INTRAVENOUS | Status: DC | PRN
  Administered 2023-12-28: 280 mg via INTRAVENOUS
  Administered 2023-12-28: 20 mg via INTRAVENOUS

## 2023-12-28 MED ORDER — EPHEDRINE 5 MG/ML INJ
INTRAVENOUS | Status: AC
  Filled 2023-12-28: qty 5

## 2023-12-28 MED ORDER — MIDAZOLAM HCL (PF) 10 MG/2ML IJ SOLN
INTRAMUSCULAR | Status: AC
  Filled 2023-12-28: qty 2

## 2023-12-28 MED ORDER — POTASSIUM CHLORIDE 10 MEQ/50ML IV SOLN
10.0000 meq | INTRAVENOUS | Status: AC
  Administered 2023-12-28 (×3): 10 meq via INTRAVENOUS

## 2023-12-28 MED ORDER — METOPROLOL TARTRATE 25 MG/10 ML ORAL SUSPENSION: 12.5000 mg | Freq: Two times a day (BID) | ORAL | Status: DC

## 2023-12-28 MED ORDER — FENTANYL CITRATE (PF) 250 MCG/5ML IJ SOLN
INTRAMUSCULAR | Status: DC | PRN
Start: 2023-12-28 — End: 2023-12-28
  Administered 2023-12-28: 100 ug via INTRAVENOUS
  Administered 2023-12-28 (×2): 50 ug via INTRAVENOUS
  Administered 2023-12-28: 100 ug via INTRAVENOUS
  Administered 2023-12-28: 500 ug via INTRAVENOUS
  Administered 2023-12-28: 100 ug via INTRAVENOUS

## 2023-12-28 MED ORDER — SUGAMMADEX SODIUM 200 MG/2ML IV SOLN
INTRAVENOUS | Status: DC | PRN
  Administered 2023-12-28: 200 mg via INTRAVENOUS

## 2023-12-28 MED ORDER — PHENYLEPHRINE 80 MCG/ML (10ML) SYRINGE FOR IV PUSH (FOR BLOOD PRESSURE SUPPORT)
PREFILLED_SYRINGE | INTRAVENOUS | Status: AC
  Filled 2023-12-28: qty 10

## 2023-12-28 MED ORDER — INSULIN REGULAR(HUMAN) IN NACL 100-0.9 UT/100ML-% IV SOLN: INTRAVENOUS | Status: DC

## 2023-12-28 MED ORDER — FENTANYL CITRATE (PF) 250 MCG/5ML IJ SOLN
INTRAMUSCULAR | Status: AC
  Filled 2023-12-28: qty 5

## 2023-12-28 MED ORDER — ROCURONIUM BROMIDE 10 MG/ML (PF) SYRINGE
PREFILLED_SYRINGE | INTRAVENOUS | Status: AC
  Filled 2023-12-28: qty 20

## 2023-12-28 SURGICAL SUPPLY — 72 items
BAG DECANTER FOR FLEXI CONT (MISCELLANEOUS) ×3 IMPLANT
BLADE CLIPPER SURG (BLADE) ×3 IMPLANT
BLADE STERNUM SYSTEM 6 (BLADE) ×3 IMPLANT
BLADE SURG 11 STRL SS (BLADE) IMPLANT
BNDG ELASTIC 4INX 5YD STR LF (GAUZE/BANDAGES/DRESSINGS) IMPLANT
BNDG ELASTIC 4X5.8 VLCR STR LF (GAUZE/BANDAGES/DRESSINGS) ×3 IMPLANT
BNDG ELASTIC 6INX 5YD STR LF (GAUZE/BANDAGES/DRESSINGS) ×3 IMPLANT
BNDG GAUZE DERMACEA FLUFF 4 (GAUZE/BANDAGES/DRESSINGS) ×3 IMPLANT
CANISTER SUCTION 3000ML PPV (SUCTIONS) ×3 IMPLANT
CANNULA MC2 2 STG 29/37 NON-V (CANNULA) IMPLANT
CANNULA NON VENT 20FR 12 (CANNULA) ×3 IMPLANT
CANNULA TRIPLE STAGE 29X29X29 (MISCELLANEOUS) IMPLANT
CATH ROBINSON RED A/P 18FR (CATHETERS) ×6 IMPLANT
CLIP TI MEDIUM 24 (CLIP) IMPLANT
CLIP TI WIDE RED SMALL 24 (CLIP) IMPLANT
CONNECTOR BLAKE 2:1 CARIO BLK (MISCELLANEOUS) ×3 IMPLANT
CONTAINER PROTECT SURGISLUSH (MISCELLANEOUS) ×6 IMPLANT
DERMABOND ADVANCED .7 DNX6 (GAUZE/BANDAGES/DRESSINGS) IMPLANT
DRAIN CHANNEL 19F RND (DRAIN) ×9 IMPLANT
DRAIN CONNECTOR BLAKE 1:1 (MISCELLANEOUS) IMPLANT
DRAPE EXTREMITY T 121X128X90 (DISPOSABLE) IMPLANT
DRAPE INCISE IOBAN 66X45 STRL (DRAPES) IMPLANT
DRAPE SRG 135X102X78XABS (DRAPES) ×3 IMPLANT
DRAPE WARM FLUID 44X44 (DRAPES) ×3 IMPLANT
DRESSING AQUACEL AG SP 3.5X10 (GAUZE/BANDAGES/DRESSINGS) IMPLANT
DRSG AQUACEL AG ADV 3.5X10 (GAUZE/BANDAGES/DRESSINGS) ×3 IMPLANT
ELECTRODE BLDE 4.0 EZ CLN MEGD (MISCELLANEOUS) ×3 IMPLANT
ELECTRODE REM PT RTRN 9FT ADLT (ELECTROSURGICAL) ×6 IMPLANT
FELT TEFLON 1X6 (MISCELLANEOUS) ×6 IMPLANT
GAUZE SPONGE 4X4 12PLY STRL (GAUZE/BANDAGES/DRESSINGS) ×6 IMPLANT
GLOVE BIO SURGEON STRL SZ7 (GLOVE) ×6 IMPLANT
GLOVE BIOGEL M STRL SZ7.5 (GLOVE) ×6 IMPLANT
GLOVE SS BIOGEL STRL SZ 7 (GLOVE) IMPLANT
GOWN STRL REUS W/ TWL LRG LVL3 (GOWN DISPOSABLE) ×12 IMPLANT
GOWN STRL REUS W/ TWL XL LVL3 (GOWN DISPOSABLE) ×6 IMPLANT
HEMOSTAT POWDER SURGIFOAM 1G (HEMOSTASIS) ×6 IMPLANT
INSERT SUTURE HOLDER (MISCELLANEOUS) ×3 IMPLANT
KIT BASIN OR (CUSTOM PROCEDURE TRAY) ×3 IMPLANT
KIT TURNOVER KIT B (KITS) ×3 IMPLANT
KIT VASOVIEW HEMOPRO 2 VH 4000 (KITS) ×3 IMPLANT
LEAD PACING MYOCARDI (MISCELLANEOUS) ×3 IMPLANT
MARKER DISTAL GRAFT W/ HOLDER (MISCELLANEOUS) ×9 IMPLANT
NS IRRIG 1000ML POUR BTL (IV SOLUTION) ×15 IMPLANT
PACK E OPEN HEART (SUTURE) ×3 IMPLANT
PACK OPEN HEART (CUSTOM PROCEDURE TRAY) ×3 IMPLANT
PAD ARMBOARD POSITIONER FOAM (MISCELLANEOUS) ×6 IMPLANT
PAD ELECT DEFIB RADIOL ZOLL (MISCELLANEOUS) ×3 IMPLANT
PENCIL BUTTON HOLSTER BLD 10FT (ELECTRODE) ×3 IMPLANT
POSITIONER HEAD DONUT 9IN (MISCELLANEOUS) ×3 IMPLANT
PUNCH AORTIC ROTATE 4.0MM (MISCELLANEOUS) ×3 IMPLANT
SET MPS 3-ND DEL (MISCELLANEOUS) IMPLANT
SHEARS HARMONIC 9CM CVD (BLADE) IMPLANT
STOPCOCK 4 WAY LG BORE MALE ST (IV SETS) IMPLANT
SUPPORT HEART JANKE-BARRON (MISCELLANEOUS) ×3 IMPLANT
SUT ETHIBOND X763 2 0 SH 1 (SUTURE) ×6 IMPLANT
SUT MNCRL AB 3-0 PS2 18 (SUTURE) ×6 IMPLANT
SUT MNCRL AB 4-0 PS2 18 (SUTURE) IMPLANT
SUT PDS AB 1 CTX 36 (SUTURE) ×6 IMPLANT
SUT PROLENE 4 0 SH DA (SUTURE) ×3 IMPLANT
SUT PROLENE 5 0 C 1 36 (SUTURE) ×9 IMPLANT
SUT PROLENE 7 0 BV1 MDA (SUTURE) ×3 IMPLANT
SUT STEEL 6MS V (SUTURE) ×6 IMPLANT
SUT VIC AB 2-0 CT1 TAPERPNT 27 (SUTURE) IMPLANT
SYSTEM SAHARA CHEST DRAIN ATS (WOUND CARE) ×3 IMPLANT
TAPE CLOTH SURG 4X10 WHT LF (GAUZE/BANDAGES/DRESSINGS) IMPLANT
TAPE PAPER 2X10 WHT MICROPORE (GAUZE/BANDAGES/DRESSINGS) IMPLANT
TOWEL GREEN STERILE (TOWEL DISPOSABLE) ×3 IMPLANT
TOWEL GREEN STERILE FF (TOWEL DISPOSABLE) ×3 IMPLANT
TRAY FOLEY SLVR 16FR TEMP STAT (SET/KITS/TRAYS/PACK) ×3 IMPLANT
TUBING LAP HI FLOW INSUFFLATIO (TUBING) ×3 IMPLANT
UNDERPAD 30X36 HEAVY ABSORB (UNDERPADS AND DIAPERS) ×3 IMPLANT
WATER STERILE IRR 1000ML POUR (IV SOLUTION) ×6 IMPLANT

## 2023-12-28 NOTE — Procedures (Signed)
 Extubation Procedure Note  Patient Details:   Name: Francisco White DOB: November 16, 1970 MRN: [id number]   Airway Documentation:    Vent end date: 12/28/23 Vent end time: 1733   Evaluation  O2 sats: stable throughout Complications: No apparent complications Patient did tolerate procedure well. Bilateral Breath Sounds: Clear, Diminished   Yes  Patient was extubated to a 4L [place] without any complications, dyspnea or stridor noted. NIF: -20, VC: 2.1L, positive cuff leak prior to extubation.   Worley Headings 12/28/2023, 5:33 PM

## 2023-12-28 NOTE — Progress Notes (Signed)
     [address]       [place] [address]             [phone]       No events Vitals:   12/28/23 0100 12/28/23 0500  BP: 107/62 134/81  Pulse: (!) 47 (!) 56  Resp: 18 18  Temp: 98.1 F (36.7 C) 98 F (36.7 C)  SpO2: 98% 99%   Arousable Sinus brady EWOB  OR today for CABG L radial  Francisco White Francisco White

## 2023-12-28 NOTE — Anesthesia Procedure Notes (Signed)
 Procedure Name: Intubation Date/Time: 12/28/2023 7:51 AM  Performed by: Laroy Plunk, CRNAPre-anesthesia Checklist: Patient identified, Emergency Drugs available, Suction available and Patient being monitored Patient Re-evaluated:Patient Re-evaluated prior to induction Oxygen Delivery Method: Circle system utilized Preoxygenation: Pre-oxygenation with 100% oxygen Induction Type: IV induction Ventilation: Mask ventilation without difficulty Laryngoscope Size: Mac and 4 Grade View: Grade I Tube type: Oral Tube size: 8.0 mm Number of attempts: 1 Airway Equipment and Method: Stylet and Oral airway Placement Confirmation: ETT inserted through vocal cords under direct vision, positive ETCO2 and breath sounds checked- equal and bilateral Secured at: 23 cm Tube secured with: Tape Dental Injury: Teeth and Oropharynx as per pre-operative assessment

## 2023-12-28 NOTE — Anesthesia Procedure Notes (Signed)
 Central Venous Catheter Insertion Performed by: Jonne Netters, MD, anesthesiologist Start/End6/09/2023 6:42 AM, 12/28/2023 6:59 AM Patient location: Pre-op. Preanesthetic checklist: patient identified, IV checked, risks and benefits discussed, surgical consent, monitors and equipment checked, pre-op evaluation, timeout performed and anesthesia consent Position: supine Lidocaine  1% used for infiltration and patient sedated Hand hygiene performed , maximum sterile barriers used  and Seldinger technique used Catheter size: 8.5 Fr Sheath introducer Procedure performed using ultrasound guided technique. Ultrasound Notes:anatomy identified, needle tip was noted to be adjacent to the nerve/plexus identified, no ultrasound evidence of intravascular and/or intraneural injection and image(s) printed for medical record Attempts: 1 Following insertion, line sutured, dressing applied and Biopatch. Post procedure assessment: free fluid flow, blood return through all ports and no air  Patient tolerated the procedure well with no immediate complications. Additional procedure comments: CVP: Timeout, sterile prep, drape, FBP R neck.  Supine position.  1% lido local, finder and trocar RIJ 1st pass with US  guidance.  Introducer placed over J wire, 3 lumen access passed into introducer. Biopatch and sterile dressing on.  Patient tolerated well.  VSS.  Fay Hoop, MD.

## 2023-12-28 NOTE — Progress Notes (Signed)
 Patient ID: Francisco White, male   DOB: Feb 12, 1971, 53 y.o.   MRN: [id number]   TCTS Evening Rounds:   Hemodynamically stable  CI = 2.7  Extubated  Urine output good  CT output low  CBC    Component Value Date/Time   WBC 13.4 (H) 12/28/2023 1815   RBC 3.77 (L) 12/28/2023 1815   HGB 10.9 (L) 12/28/2023 1852   HGB 15.7 12/22/2023 1615   HCT 32.0 (L) 12/28/2023 1852   HCT 47.6 12/22/2023 1615   PLT 111 (L) 12/28/2023 1815   PLT 201 12/22/2023 1615   MCV 90.2 12/28/2023 1815   MCV 91 12/22/2023 1615   MCH 30.5 12/28/2023 1815   MCHC 33.8 12/28/2023 1815   RDW 12.0 12/28/2023 1815   RDW 12.1 12/22/2023 1615   LYMPHSABS 1.0 01/02/2021 0805   MONOABS 1.0 01/02/2021 0805   EOSABS 0.1 01/02/2021 0805   BASOSABS 0.1 01/02/2021 0805     BMET    Component Value Date/Time   NA 145 12/28/2023 1852   NA 140 12/22/2023 1615   K 3.5 12/28/2023 1852   CL 108 12/28/2023 1815   CO2 24 12/28/2023 1815   GLUCOSE 108 (H) 12/28/2023 1815   BUN 12 12/28/2023 1815   BUN 14 12/22/2023 1615   CREATININE 0.91 12/28/2023 1815   CREATININE 1.30 10/02/2015 0914   CALCIUM  8.0 (L) 12/28/2023 1815   EGFR 77 12/22/2023 1615   GFRNONAA >60 12/28/2023 1815     A/P:  Stable postop course. Continue current plans

## 2023-12-28 NOTE — Consult Note (Signed)
 NAME:  Francisco White, MRN:  [id number], DOB:  April 10, 1971, LOS: 4 ADMISSION DATE:  12/24/2023, CONSULTATION DATE:  6/3 REFERRING MD:  Dr. Deloise Ferries, CHIEF COMPLAINT:  CABG x 3  History of Present Illness:  Patient is a 53 yo M with CAD previous PCI 2010, HTN, HLD, Depression presents to [facility] on 5/30 w/ unstable angina.   Patient has been having progressive chest pain since April 2025. Patient recently evaluated at [facility] mid May 2025. Coronary CTA concerning for RCA and multivessel disease. Patient then began having worsening chest pain on 5/23 and came to [facility] ED. Troponin negative and was sent home same day. On 5/30 presents to [facility] for cardiac cath showing 50-80% stenosis of LAD, 80% stenosis of circumflex and 70-00% stenosis of RCA. Patient admitted and started on heparin . Echo lvef 60-65%. TCTS consulted and plan for CABG. 6/3 patient taken for CABG x 3. Post op intubated/sedated brought to [facility] ICU. PCCM consulted.  EBL:  Blood Administration: none Xclamp Time:  50 min Pump Time:   Pertinent  Medical History   Past Medical History:  Diagnosis Date   CAD (coronary artery disease)    Depression 03/26/2013   Family history of heart disease    History of nuclear stress test 06/2011   exercise; negative for ischemia, normal pattern of perfusion    HTN (hypertension) 03/26/2013   Hyperlipidemia    Hypertension    Skin sensation disturbance 04/07/2017   Umbilical hernia 03/26/2013     Significant Hospital Events: Including procedures, antibiotic start and stop dates in addition to other pertinent events   5/30 heart cath showing multivessel CAD; started on heparin , TCTS consulted 6/3 CABG x 3  Interim History / Subjective:  As above  Objective    Blood pressure 116/74, pulse (!) 44, temperature 98 F (36.7 C), temperature source Oral, resp. rate (!) 8, height 6\' 2"  (1.88 m), weight 98.5 kg, SpO2 97%.        Intake/Output Summary (Last 24 hours) at  12/28/2023 1002 Last data filed at 12/28/2023 7829 Gross per 24 hour  Intake 500 ml  Output 200 ml  Net 300 ml   Filed Weights   12/24/23 0848 12/24/23 1312  Weight: 101.6 kg 98.5 kg     Physical exam: General: Crtitically ill-appearing male, orally intubated HEENT: [place]/AT, eyes anicteric.  ETT and cortrak in place Neuro: Sedated, not following commands.  Eyes are closed.  Pupils 3 mm bilateral reactive to light Chest: Central sternotomy incision is clean and dry.  Coarse breath sounds, no wheezes or rhonchi.  Mediastinal chest tube in place Heart: Regular rate and rhythm, no murmurs or gallops Abdomen: Soft, nondistended, bowel sounds present  Labs and imaging reviewed  Resolved Hospital Problem list     Assessment & Plan:  Multivessel coronary artery disease s/p CABG x 3 with radial artery harvest Continue aspirin  and statin Chest tube management TCTS Continue to titrate Precedex with RASS goal 0/-1 Continue clevidipine at 2.5 Continue pain control with tramadol, oxycodone and morphine Closely monitor chest tube output  Acute respiratory insufficiency, postop Continue on protective ventilation VAP prevention bundle in place Rapid weaning protocol ordered is in place  Hypertension Holding antihypertensive for now, as patient is requiring low-dose phenylephrine  Hyperlipidemia Continue Crestor   Expected perioperative blood loss anemia 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 Monitor H/H and PLT counts  Depression Continue sertraline     Best Practice (right click and "Reselect all SmartList Selections" daily)  Diet/type: NPO w/ meds via tube DVT prophylaxis: SCD GI prophylaxis: PPI Lines: Central line and Arterial Line Foley:  Yes, and it is still needed Code Status:  full code Last date of multidisciplinary goals of care discussion [per primary]  Labs   CBC: Recent Labs  Lab 12/22/23 1615 12/25/23 0452 12/25/23 0452 12/26/23 0443 12/27/23 0539 12/28/23 0545  12/28/23 0757 12/28/23 0800  WBC 10.0 8.0  --  8.1 7.1 7.9  --   --   HGB 15.7 13.8   < > 13.1 13.7 14.4 12.9* 13.3  HCT 47.6 40.8   < > 38.7* 40.7 42.6 38.0* 39.0  MCV 91 89.1  --  90.2 89.3 89.9  --   --   PLT 201 143*  --  139* 135* 150  --   --    < > = values in this interval not displayed.    Basic Metabolic Panel: Recent Labs  Lab 12/22/23 1615 12/26/23 0443 12/27/23 0539 12/28/23 0545 12/28/23 0757 12/28/23 0800  NA 140 139 138 140 140 140  K 4.1 3.9 3.9 4.0 4.0 4.0  CL 100 108 107 109 106  --   CO2 23 24 26 24   --   --   GLUCOSE 85 96 95 88 92  --   BUN 14 18 14 16 15   --   CREATININE 1.13 1.00 1.05 1.06 0.90  --   CALCIUM  9.7 8.9 8.9 9.3  --   --    GFR: Estimated Creatinine Clearance: 107.8 mL/min (by C-G formula based on SCr of 0.9 mg/dL). Recent Labs  Lab 12/25/23 0452 12/26/23 0443 12/27/23 0539 12/28/23 0545  WBC 8.0 8.1 7.1 7.9    Liver Function Tests: No results for input(s): "AST", "ALT", "ALKPHOS", "BILITOT", "PROT", "ALBUMIN" in the last 168 hours. No results for input(s): "LIPASE", "AMYLASE" in the last 168 hours. No results for input(s): "AMMONIA" in the last 168 hours.  ABG    Component Value Date/Time   PHART 7.326 (L) 12/28/2023 0800   PCO2ART 43.5 12/28/2023 0800   PO2ART 514 (H) 12/28/2023 0800   HCO3 22.7 12/28/2023 0800   TCO2 24 12/28/2023 0800   ACIDBASEDEF 3.0 (H) 12/28/2023 0800   O2SAT 100 12/28/2023 0800     Coagulation Profile: Recent Labs  Lab 12/27/23 0959  INR 1.1    Cardiac Enzymes: No results for input(s): "CKTOTAL", "CKMB", "CKMBINDEX", "TROPONINI" in the last 168 hours.  HbA1C: Hgb A1c MFr Bld  Date/Time Value Ref Range Status  12/24/2023 01:50 PM 5.4 4.8 - 5.6 % Final    Comment:    (NOTE) Diagnosis of Diabetes The following HbA1c ranges recommended by the American Diabetes Association (ADA) may be used as an aid in the diagnosis of diabetes mellitus.  Hemoglobin             Suggested A1C NGSP%               Diagnosis  <5.7                   Non Diabetic  5.7-6.4                Pre-Diabetic  >6.4                   Diabetic  <7.0                   Glycemic control for  adults with diabetes.    09/23/2022 04:24 PM 5.4 4.6 - 6.5 % Final    Comment:    Glycemic Control Guidelines for People with Diabetes:Non Diabetic:  <6%Goal of Therapy: <7%Additional Action Suggested:  >8%     CBG: No results for input(s): "GLUCAP" in the last 168 hours.  Review of Systems:   Patient is intubated; therefore, history has been obtained from chart review.    Past Medical History:  He,  has a past medical history of CAD (coronary artery disease), Depression (03/26/2013), Family history of heart disease, History of nuclear stress test (06/2011), HTN (hypertension) (03/26/2013), Hyperlipidemia, Hypertension, Skin sensation disturbance (04/07/2017), and Umbilical hernia (03/26/2013).   Surgical History:   Past Surgical History:  Procedure Laterality Date   APPENDECTOMY  1977   CORONARY ANGIOPLASTY WITH STENT PLACEMENT  10/2008   xience 3.5x55mm stent to LAD ([place] , [place])   HERNIA REPAIR  1996   LEFT HEART CATH AND CORONARY ANGIOGRAPHY N/A 12/24/2023   Procedure: LEFT HEART CATH AND CORONARY ANGIOGRAPHY;  Surgeon: Kyra Phy, MD;  Location: [facility] INVASIVE CV LAB;  Service: Cardiovascular;  Laterality: N/A;   TRANSTHORACIC ECHOCARDIOGRAM  2010   EF ~65%; borderline LA enlargement      Social History:   reports that he has never smoked. He has never used smokeless tobacco. He reports current alcohol use. He reports that he does not use drugs.   Family History:  His family history includes Depression in his mother; Heart disease in his father, paternal grandfather, and paternal grandmother.   Allergies Allergies  Allergen Reactions   Simvastatin Other (See Comments)    Myalgias    Olmesartan  Other (See Comments)    Dizziness, lightheadedness      Home  Medications  Prior to Admission medications   Medication Sig Start Date End Date Taking? Authorizing Provider  aspirin  81 MG tablet Take 81 mg by mouth daily.   Yes [provider]  hydrochlorothiazide  (HYDRODIURIL ) 12.5 MG tablet Take 1 tablet (12.5 mg total) by mouth daily. 12/14/22  Yes Croitoru, Mihai, MD  isosorbide  mononitrate (IMDUR ) 30 MG 24 hr tablet Take 1 tablet (30 mg total) by mouth daily. 12/22/23 03/21/24 Yes O'Neal, Cathay Clonts, MD  losartan  (COZAAR ) 100 MG tablet Take 1 tablet (100 mg total) by mouth daily. 12/14/22  Yes Croitoru, Mihai, MD  Multiple Vitamins-Minerals (EQ MULTIVITAMINS ADULT GUMMY PO) Take 1 each by mouth daily.   Yes [provider]  nitroGLYCERIN  (NITROSTAT ) 0.4 MG SL tablet Place 1 tablet (0.4 mg total) under the tongue every 5 (five) minutes as needed for chest pain. Max 3 tablets per event. 12/22/23 03/21/24 Yes O'Neal, Cathay Clonts, MD  rosuvastatin  (CRESTOR ) 40 MG tablet Take 1 tablet (40 mg total) by mouth daily. 11/25/23  Yes Croitoru, Mihai, MD  sertraline  (ZOLOFT ) 100 MG tablet Take 1 tablet (100 mg total) by mouth daily. 02/22/23  Yes [place], Betty G, MD      The patient is critically ill due to coronary artery disease status post CABG/acute respiratory insufficiency requiring titration of ventilator.  Critical care was necessary to treat or prevent imminent or life-threatening deterioration.  Critical care was time spent personally by me on the following activities: development of treatment plan with patient and/or surrogate as well as nursing, discussions with consultants, evaluation of patient's response to treatment, examination of patient, obtaining history from patient or surrogate, ordering and performing treatments and interventions, ordering and review of laboratory studies, ordering and review of radiographic studies,  pulse oximetry, re-evaluation of patient's condition and participation in multidisciplinary rounds.   During this  encounter critical care time was devoted to patient care services described in this note for 38 minutes.     Trevor Fudge, MD [facility] See Amion for pager If no response to pager, please call [phone] until 7pm After 7pm, Please call E-link [phone]

## 2023-12-28 NOTE — Hospital Course (Addendum)
 History of Present Illness:  [name] is a 53 year old male with a past medical history of CAD (PCI to LAD in 2010), HTN, and HLD. The patient has been experiencing chest tightness, burning, shortness of breath and feeling of indigestion since April 2025 he reports these are similar to the symptoms he felt prior to his last heart stent. The symptoms occur ar rest and with physical activity. He also notes his arms will fall asleep at times but this has been happening for the last 3 years. He does also notice intermittent palpitations. He denies nausea, vomiting, and LOC. Coronary CTA was performed in May 2025 at [facility] and patient saw in MyChart severe RCA disease and multivessel disease but no follow up was made. He developed chest pain at his daughter's graduation and presented to [facility] ED on 05/23 with chest pain and negative troponin levels, he was discharged the same day. On 05/30 he underwent outpatient cardiac catheterization which showed 50-80% stenosis of the LAD, 80% stenosis of the circumflex, and 70-99% stenosis of the RCA. Due to unstable angina he was admitted to the hospital and started on a heparin  drip. His last echo was in 2022 and showed LVEF 60-65% and mild aortic sclerosis with no aortic stenosis and no other signs of valvularly abnormality.   The patient has a family history of heart disease with his dad and grandfather requiring bypass surgeries in their 34s. He was able to mow the lawn yesterday and lives an active and healthy lifestyle. His 2 daughters are home from college for the summer and will be living with him.  He was evaluated by Dr. Deloise Ferries who was in agreement the patient would benefit from coronary bypass grafting procedure.  The risks and benefits of the procedure were explained to the patient and he was agreeable to proceed.  Hospital Course:  The patient remained chest pain free prior to surgery.  He was taken to the operating room on 12/28/2023.   He underwent CABG x 3 utilizing LIMA to LAD, SVG to PDA, and Radial artery to OM.  He also underwent endoscopic harvest of greater saphenous vein from his right thigh and open left radial artery harvest.  He tolerated the procedure without difficulty and was taken to the SICU in stable condition.

## 2023-12-28 NOTE — Anesthesia Procedure Notes (Signed)
 Arterial Line Insertion Start/End6/09/2023 7:00 AM Performed by: CRNA  Preanesthetic checklist: patient identified, IV checked, site marked, risks and benefits discussed, surgical consent, monitors and equipment checked, pre-op evaluation, timeout performed and anesthesia consent Lidocaine  1% used for infiltration and patient sedated Right, radial was placed Catheter size: 20 G Hand hygiene performed  and maximum sterile barriers used   Attempts: 1 Procedure performed using ultrasound guided technique. Ultrasound Notes:anatomy identified, needle tip was noted to be adjacent to the nerve/plexus identified and no ultrasound evidence of intravascular and/or intraneural injection Following insertion, Biopatch. Post procedure assessment: normal  Patient tolerated the procedure well with no immediate complications.

## 2023-12-28 NOTE — Transfer of Care (Signed)
 Immediate Anesthesia Transfer of Care Note  Patient: Francisco White  Procedure(s) Performed: CORONARY ARTERY BYPASS GRAFTING X THREE, USING LEFT INTERNAL MAMMARY ARTERY, RIGHT ENDOSCOPIC HARVESTED GREATER SAPHENOUS VEIN GRAFT AND LEFT RADIAL ARTERY (Chest) ECHOCARDIOGRAM, TRANSESOPHAGEAL, INTRAOPERATIVE SURGICAL PROCUREMENT, ARTERY, RADIAL (Left: Arm Lower)  Patient Location: ICU  Anesthesia Type:General  Level of Consciousness: sedated and Patient remains intubated per anesthesia plan  Airway & Oxygen Therapy: Patient remains intubated per anesthesia plan and Patient placed on Ventilator (see vital sign flow sheet for setting)  Post-op Assessment: Report given to RN and Post -op Vital signs reviewed and stable  Post vital signs: Reviewed and stable  Last Vitals:  Vitals Value Taken Time  BP    Temp    Pulse 80 12/28/23 1321  Resp 18 12/28/23 1321  SpO2 99 % 12/28/23 1321  Vitals shown include unfiled device data.  Last Pain:  Vitals:   12/28/23 0500  TempSrc: Oral  PainSc:       Patients Stated Pain Goal: 0 (12/26/23 2242)  Complications: No notable events documented.

## 2023-12-28 NOTE — Brief Op Note (Signed)
 12/24/2023 - 12/28/2023  12:44 PM  PATIENT:  Francisco White  53 y.o. male  PRE-OPERATIVE DIAGNOSIS:  coronary artery disease  POST-OPERATIVE DIAGNOSIS:  coronary artery disease  PROCEDURE:  Procedure(s):  CORONARY ARTERY BYPASS GRAFTING x 3 -LIMA to LAD -Radial Artery to OM -SVG to PDA  ECHOCARDIOGRAM, TRANSESOPHAGEAL, INTRAOPERATIVE (N/A)  SURGICAL PROCUREMENT, ARTERY, RADIAL (Left) Harvest time 30 min Prep time 10  ENDOSCOPIC HARVEST GREATER SAPHENOUS VEIN RIGHT THIGH Vein harvest time: 25 min Vein prep time: 10 min  SURGEON:  Surgeons and Role:    * Hilarie Lovely, MD - Primary  PHYSICIAN ASSISTANT: Gates Kasal PA-C, Moira Andrews PA-C  ASSISTANTS: Elmira Haddock RNFA   ANESTHESIA:   general  EBL:  Per Anesthesia Record  BLOOD ADMINISTERED:  CC CELLSAVER  DRAINS: Left Pleural Chest Tubes, Mediastinal Chest Drains   LOCAL MEDICATIONS USED:  NONE  SPECIMEN:  No Specimen  DISPOSITION OF SPECIMEN:  N/A  COUNTS:  YES  TOURNIQUET:  * No tourniquets in log *  DICTATION: .Dragon Dictation  PLAN OF CARE: Admit to inpatient   PATIENT DISPOSITION:  ICU - intubated and hemodynamically stable.   Delay start of Pharmacological VTE agent (>24hrs) due to surgical blood loss or risk of bleeding: yes

## 2023-12-28 NOTE — Anesthesia Postprocedure Evaluation (Signed)
 Anesthesia Post Note  Patient: Francisco White  Procedure(s) Performed: CORONARY ARTERY BYPASS GRAFTING X THREE, USING LEFT INTERNAL MAMMARY ARTERY, RIGHT ENDOSCOPIC HARVESTED GREATER SAPHENOUS VEIN GRAFT AND LEFT RADIAL ARTERY (Chest) ECHOCARDIOGRAM, TRANSESOPHAGEAL, INTRAOPERATIVE SURGICAL PROCUREMENT, ARTERY, RADIAL (Left: Arm Lower)     Patient location during evaluation: SICU Anesthesia Type: General Level of consciousness: sedated and patient remains intubated per anesthesia plan Pain management: pain level controlled Vital Signs Assessment: post-procedure vital signs reviewed and stable Respiratory status: patient remains intubated per anesthesia plan and patient on ventilator - see flowsheet for VS Cardiovascular status: stable (on phenylephrine infusion) Postop Assessment: no apparent nausea or vomiting Anesthetic complications: no   No notable events documented.  Last Vitals:  Vitals:   12/28/23 1500 12/28/23 1515  BP:    Pulse: 77 77  Resp: 16 16  Temp: (!) 35.9 C (!) 36.1 C  SpO2: 100% 100%    Last Pain:  Vitals:   12/28/23 1400  TempSrc:   PainSc: 2                  Bluford Sedler,[name]

## 2023-12-28 NOTE — Op Note (Signed)
 [address]       Francisco White [address]             [phone]                                          12/28/2023 Patient:  Francisco White Pre-Op Dx: 3V CAD     Unstable angina   HTN   HLP Post-op Dx:  same Procedure: CABG X 3.  LIMA LAD, RSVG PDA, Left radial to OM (T graft off the vein)   Endoscopic greater saphenous vein harvest on the right Open left radial artery harvest   Surgeon and Role:      * Francisco White, Francisco Siad, MD - Primary    * Francisco White , PA-C - assisting An experienced assistant was required given the complexity of this surgery and the standard of surgical care. The assistant was needed for exposure, dissection, suctioning, retraction of delicate tissues and sutures, instrument exchange and for overall help during this procedure.    Anesthesia  general EBL:  Blood Administration: none Xclamp Time:  50 min Pump Time:   Drains: 89 F blake drain: L, mediastinal  Wires: V Counts: correct   Indications: 53yo male with 3V CAD, no significant valvular disease, and preserved biventricular function. We discussed the option with CABG with use of the left radial. He is agreeable to proceed.   Findings: Good LIMA, vein, and radial.  Calcified LAD and PDA, good OM  Operative Technique: All invasive lines were placed in pre-op holding.  After the risks, benefits and alternatives were thoroughly discussed, the patient was brought to the operative theatre.  Anesthesia was induced, and the patient was prepped and draped in normal sterile fashion.  An appropriate surgical pause was performed, and pre-operative antibiotics were dosed accordingly.  We began with simultaneous incisions along the right leg for harvesting of the greater saphenous vein, the left arm for the radial artery and the chest for the sternotomy.  In regards to the sternotomy, this was carried down with bovie cautery, and the sternum was divided with a reciprocating  saw.  Meticulous hemostasis was obtained.  The left internal thoracic artery was exposed and harvested in in pedicled fashion.  The patient was systemically heparinized, and the artery was divided distally, and placed in a papaverine sponge.    The sternal elevator was removed, and a retractor was placed.  The pericardium was divided in the midline and fashioned into a cradle with pericardial stitches.   After we confirmed an appropriate ACT, the ascending aorta was cannulated in standard fashion.  The right atrial appendage was used for venous cannulation site.  Cardiopulmonary bypass was initiated, and the heart retractor was placed. The cross clamp was applied, and a dose of anterograde cardioplegia was given with good arrest of the heart.  We moved to the posterior wall of the heart, and found a good target on the PDA.  An arteriotomy was made, and the vein graft was anastomosed to it in an end to side fashion.  Next we exposed the lateral wall, and found a good target on the OM.  An end to side anastomosis with the radial graft was then created.  Finally, we exposed a good target on the LAD, and fashioned an end to side anastomosis between it and the LITA.  We began to re-warm, and a re-animation dose of cardioplegia was given.  The heart was de-aired, and the cross clamp was removed.  Meticulous hemostasis was obtained.    A partial occludding clamp was then placed on the ascending aorta, and we created an end to side anastomosis between it and the proximal vein graft.  The radial artery was jumped off the hood of the vein graft.  Rings were placed on the proximal anastomosis.  Hemostasis was obtained, and we separated from cardiopulmonary bypass without event.  The heparin  was reversed with protamine.  Chest tubes and wires were placed, and the sternum was re-approximated with sternal wires.  The soft tissue and skin were re-approximated wth absorbable suture.    The patient tolerated the procedure  without any immediate complications, and was transferred to the ICU in guarded condition.  Francisco White

## 2023-12-28 NOTE — Discharge Summary (Signed)
 [address]       [place] [address]             [phone]    Physician Discharge Summary  Patient ID: Francisco White MRN: [id number] DOB/AGE: March 25, 1971 53 y.o.  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 is a 53 year old male with a past medical history of CAD (PCI to LAD in 2010), HTN, and HLD. The patient has been experiencing chest tightness, burning, shortness of breath and feeling of indigestion since April 2025 he reports these are similar to the symptoms he felt prior to his last heart stent. The symptoms occur ar rest and with physical activity. He also notes his arms will fall asleep at times but this has been happening for the last 3 years. He does also notice intermittent palpitations. He denies nausea, vomiting, and LOC. Coronary CTA was performed in May 2025 at [facility] medical [facility] and patient saw  in MyChart severe RCA disease and multivessel disease but no follow up was made. He developed chest pain at his daughter's graduation and presented to [facility] ED on 05/23 with chest pain and negative troponin levels, he was discharged the same day. On 05/30 he underwent outpatient cardiac catheterization which showed 50-80% stenosis of the LAD, 80% stenosis of the circumflex, and 70-99% stenosis of the RCA. Due to unstable angina he was admitted to the hospital and started on a heparin  drip. His last echo was in 2022 and showed LVEF 60-65% and mild aortic sclerosis with no aortic stenosis and no other signs of valvularly abnormality.   The patient has a family history of heart disease with his dad and grandfather requiring bypass surgeries in their 70s. He was able to mow the lawn yesterday and lives an active and healthy lifestyle. His 2 daughters are home from college for the summer and will be living with him.  He was evaluated by Dr. Deloise White who was in agreement the patient would benefit from coronary bypass grafting procedure.  The risks and benefits of the procedure were explained to the patient and he was agreeable to proceed.  Hospital Course:  The patient remained chest pain free prior to surgery.  He was taken to the operating room on 12/28/2023.  He underwent CABG x 3 utilizing LIMA to LAD, SVG to PDA, and Radial artery to OM.  He also underwent endoscopic harvest of greater saphenous vein from his right thigh and open left radial artery harvest.  He  tolerated the procedure without difficulty and was taken to the SICU in stable condition.  The patient was extubated the evening of surgery.  He was weaned off phenylephrine  as hemodynamics allowed.  He was transitioned off Cardene  to Norvasc  for his radial artery graft.  His arterial line and chest tubes were removed routinely.  He was transferred to 4E  Progressive Care.  Diet and activity were advanced and well-tolerated.  By the third postoperative  day, he was completely independent with mobility and transfers.  He had return of appropriate bowel and bladder function.  He did not have any significant arrhythmias.  He is ready for discharge to home on postop day 4.  Consults: pulmonary/intensive care  Significant Diagnostic Studies: angiography:     Dist LM to Ost LAD lesion is 50% stenosed.   Prox LAD to Mid LAD lesion is 75% stenosed.   Mid LAD lesion is 80% stenosed.   Ost Cx to Prox Cx lesion is 80% stenosed.   Prox RCA lesion is 99% stenosed.   Mid RCA to Dist RCA lesion is 70% stenosed.   1st Mrg lesion is 25% stenosed.   1.  Proximal LAD stent with high-grade in-stent restenosis. 2.  Severe multivessel disease. 3.  LVEDP of 22 mmHg.   Recommendation: Given extremely high-grade right coronary artery lesion in conjunction with chest pain symptoms at rest last night, will admit to the hospital, place on heparin  drip, and obtain expedited cardiothoracic surgical evaluation.  Treatments: surgery:      Discharge Exam: Blood pressure 120/68, pulse 74, temperature 98.4 F (36.9 C), temperature source Oral, resp. rate 15, height 6\' 2"  (1.88 m), weight 97.2 kg, SpO2 98%. General appearance: alert, cooperative, and no distress Neurologic: intact Heart: Regular rate and rhythm, no murmur.  Monitor showing sinus rhythm  60s-70s.  No significant arrhythmias Lungs: Breath sounds clear and normal work of breathing.  Chest tubes are out. Abdomen: Soft, no tenderness Extremities: Well-perfused, no peripheral edema.  Left hand with no motor or sensory deficit.  Radial harvest incision is open to air and is intact and dry. Wound: Sternotomy incision and the EVH incision are intact and dry.   Discharge Medications:  The patient has been discharged on:   1.Beta Blocker:  Yes [  x ]                              No   [   ]                              If No, reason:  2.Ace Inhibitor/ARB: Yes [   ]                                      No  [  x  ]                                     If No, reason: Soft blood pressure  3.Statin:   Yes [  x ]                  No  [   ]  If No, reason:  4.Ecasa:  Yes  [  x ]                  No   [   ]                  If No, reason:  Patient had ACS upon admission: No  Plavix/P2Y12 inhibitor: Yes [ x  ]                                      No  [   ]     Discharge Instructions     Amb Referral to Cardiac Rehabilitation   Complete by: As directed    Diagnosis: CABG   CABG X ___: 3   After initial evaluation and assessments completed: Virtual Based Care may be provided alone or in conjunction with Phase 2 Cardiac Rehab based on patient barriers.: Yes   Intensive Cardiac Rehabilitation (ICR) [facility] location only OR Traditional Cardiac Rehabilitation (TCR) *If criteria for ICR are not met will enroll in TCR [facility] only): Yes      Allergies as of 01/01/2024       Reactions   Simvastatin Other (See Comments)   Myalgias   Olmesartan  Other (See Comments)   Dizziness, lightheadedness         Medication List     STOP taking these medications    aspirin  81 MG tablet Replaced by: aspirin  EC 325 MG tablet   isosorbide  mononitrate 30 MG 24 hr tablet Commonly known as: IMDUR    losartan  100 MG tablet Commonly known as: COZAAR        TAKE these medications    acetaminophen  325 MG tablet Commonly known as: TYLENOL  Take 2 tablets (650 mg total) by mouth every 6 (six) hours.   amLODipine  5 MG tablet Commonly known as: NORVASC  Take 1 tablet (5 mg total) by mouth daily.   aspirin  EC 325 MG tablet Take 1 tablet (325 mg total) by mouth daily. Replaces: aspirin  81 MG tablet   EQ MULTIVITAMINS ADULT GUMMY PO Take 1 each by mouth daily.   hydrochlorothiazide  12.5 MG tablet Commonly known as: HYDRODIURIL  Take 1 tablet (12.5 mg total) by mouth daily.   metoprolol  succinate 25 MG 24 hr tablet Commonly known as: Toprol  XL Take 1 tablet (25 mg total) by mouth  daily.   nitroGLYCERIN  0.4 MG SL tablet Commonly known as: NITROSTAT  Place 1 tablet (0.4 mg total) under the tongue every 5 (five) minutes as needed for chest pain. Max 3 tablets per event.   rosuvastatin  40 MG tablet Commonly known as: CRESTOR  Take 1 tablet (40 mg total) by mouth daily.   sertraline  100 MG tablet Commonly known as: ZOLOFT  Take 1 tablet (100 mg total) by mouth daily.   traMADol  50 MG tablet Commonly known as: ULTRAM  Take 1 tablet (50 mg total) by mouth every 6 (six) hours as needed for up to 7 days for moderate pain (pain score 4-6).        Follow-up Information     Hilarie Lovely, MD. Call on 01/01/2024.   Specialty: Cardiothoracic Surgery Why: This is a virtual visit. Do not go to the office. Dr. Deloise White will call you at around 2:50pm. Contact information: [address] [place] [address] [phone]         West, Katlyn D, NP. Go on 01/14/2024.   Specialty: Cardiology  Why: Your appointment is at 10:55 AM. Contact information: [address] [place] [address] [phone]                 Signed:  Leata Providence, PA-C  01/01/2024, 8:32 AM

## 2023-12-29 ENCOUNTER — Encounter (HOSPITAL_COMMUNITY): Payer: Self-pay | Admitting: Thoracic Surgery (Cardiothoracic Vascular Surgery)

## 2023-12-29 ENCOUNTER — Inpatient Hospital Stay (HOSPITAL_COMMUNITY)

## 2023-12-29 LAB — BASIC METABOLIC PANEL WITH GFR
Anion gap: 11 (ref 5–15)
Anion gap: 7 (ref 5–15)
BUN: 10 mg/dL (ref 6–20)
BUN: 11 mg/dL (ref 6–20)
CO2: 24 mmol/L (ref 22–32)
CO2: 28 mmol/L (ref 22–32)
Calcium: 8.6 mg/dL — ABNORMAL LOW (ref 8.9–10.3)
Calcium: 9 mg/dL (ref 8.9–10.3)
Chloride: 104 mmol/L (ref 98–111)
Chloride: 101 mmol/L (ref 98–111)
Creatinine, Ser: 0.88 mg/dL (ref 0.61–1.24)
Creatinine, Ser: 0.99 mg/dL (ref 0.61–1.24)
GFR, Estimated: >60 mL/min (ref >60)
GFR, Estimated: >60 mL/min (ref >60)
Glucose, Bld: 112 mg/dL — ABNORMAL HIGH (ref 70–99)
Glucose, Bld: 122 mg/dL — ABNORMAL HIGH (ref 70–99)
Potassium: 4 mmol/L (ref 3.5–5.1)
Potassium: 4 mmol/L (ref 3.5–5.1)
Sodium: 139 mmol/L (ref 135–145)
Sodium: 136 mmol/L (ref 135–145)

## 2023-12-29 LAB — CBC
HCT: 33.7 % — ABNORMAL LOW (ref 39.0–52.0)
HCT: 39.3 % (ref 39.0–52.0)
Hemoglobin: 11.4 g/dL — ABNORMAL LOW (ref 13.0–17.0)
Hemoglobin: 12.9 g/dL — ABNORMAL LOW (ref 13.0–17.0)
MCH: 30.6 pg (ref 26.0–34.0)
MCH: 30.2 pg (ref 26.0–34.0)
MCHC: 33.8 g/dL (ref 30.0–36.0)
MCHC: 32.8 g/dL (ref 30.0–36.0)
MCV: 90.6 fL (ref 80.0–100.0)
MCV: 92 fL (ref 80.0–100.0)
Platelets: 116 10*3/uL — ABNORMAL LOW (ref 150–400)
Platelets: 125 10*3/uL — ABNORMAL LOW (ref 150–400)
RBC: 3.72 MIL/uL — ABNORMAL LOW (ref 4.22–5.81)
RBC: 4.27 MIL/uL (ref 4.22–5.81)
RDW: 12.3 % (ref 11.5–15.5)
RDW: 12.5 % (ref 11.5–15.5)
WBC: 11.7 10*3/uL — ABNORMAL HIGH (ref 4.0–10.5)
WBC: 13.9 10*3/uL — ABNORMAL HIGH (ref 4.0–10.5)
nRBC: 0 % (ref 0.0–0.2)
nRBC: 0 % (ref 0.0–0.2)

## 2023-12-29 LAB — MAGNESIUM
Magnesium: 2.1 mg/dL (ref 1.7–2.4)
Magnesium: 2.1 mg/dL (ref 1.7–2.4)

## 2023-12-29 LAB — GLUCOSE, CAPILLARY
Glucose-Capillary: 102 mg/dL — ABNORMAL HIGH (ref 70–99)
Glucose-Capillary: 108 mg/dL — ABNORMAL HIGH (ref 70–99)
Glucose-Capillary: 113 mg/dL — ABNORMAL HIGH (ref 70–99)
Glucose-Capillary: 115 mg/dL — ABNORMAL HIGH (ref 70–99)

## 2023-12-29 MED ORDER — FUROSEMIDE 10 MG/ML IJ SOLN
40.0000 mg | Freq: Once | INTRAMUSCULAR | Status: AC
  Administered 2023-12-29: 40 mg via INTRAVENOUS
  Filled 2023-12-29: qty 4

## 2023-12-29 MED ORDER — ENOXAPARIN SODIUM 40 MG/0.4ML IJ SOSY
40.0000 mg | PREFILLED_SYRINGE | Freq: Every day | INTRAMUSCULAR | Status: DC
  Administered 2023-12-29 – 2023-12-31 (×3): 40 mg via SUBCUTANEOUS
  Filled 2023-12-29 (×3): qty 0.4

## 2023-12-29 MED ORDER — METHOCARBAMOL 500 MG PO TABS
500.0000 mg | ORAL_TABLET | Freq: Once | ORAL | Status: AC
  Administered 2023-12-29: 500 mg via ORAL
  Filled 2023-12-29: qty 1

## 2023-12-29 MED ORDER — INSULIN ASPART 100 UNIT/ML IJ SOLN: 0.0000 [IU] | INTRAMUSCULAR | Status: DC

## 2023-12-29 MED ORDER — AMLODIPINE BESYLATE 5 MG PO TABS
5.0000 mg | ORAL_TABLET | Freq: Every day | ORAL | Status: DC
  Administered 2023-12-29 – 2024-01-01 (×4): 5 mg via ORAL
  Filled 2023-12-29 (×4): qty 1

## 2023-12-29 MED ORDER — INSULIN ASPART 100 UNIT/ML IJ SOLN
0.0000 [IU] | INTRAMUSCULAR | Status: DC
  Administered 2023-12-29: 2 [IU] via SUBCUTANEOUS

## 2023-12-29 MED ORDER — FUROSEMIDE 10 MG/ML IJ SOLN: 40.0000 mg | Freq: Once | INTRAMUSCULAR | Status: DC

## 2023-12-29 NOTE — Progress Notes (Addendum)
 [facility] DAILY ICU PROGRESS NOTE                   [address]            [place] [address]          [phone]   1 Day Post-Op Procedure(s) (LRB): CORONARY ARTERY BYPASS GRAFTING X THREE, USING LEFT INTERNAL MAMMARY ARTERY, RIGHT ENDOSCOPIC HARVESTED GREATER SAPHENOUS VEIN GRAFT AND LEFT RADIAL ARTERY (N/A) ECHOCARDIOGRAM, TRANSESOPHAGEAL, INTRAOPERATIVE (N/A) SURGICAL PROCUREMENT, ARTERY, RADIAL (Left)  Total Length of Stay:  LOS: 5 days   Subjective: Extubated around 5:30 last evening. Up in the bedside chair.  Pain control reasonable.  Nicardipine infusing at 1 mg/h   Objective: Vital signs in last 24 hours: Temp:  [95.2 F (35.1 C)-100.2 F (37.9 C)] 100.2 F (37.9 C) (06/04 0600) Pulse Rate:  [60-85] 62 (06/04 0600) Cardiac Rhythm: Ventricular paced (06/03 2000) Resp:  [8-24] 19 (06/04 0600) BP: (91-112)/(46-57) 112/52 (06/03 1733) SpO2:  [96 %-100 %] 99 % (06/04 0600) Arterial Line BP: (82-148)/(41-71) 130/55 (06/04 0600) FiO2 (%):  [40 %-50 %] 40 % (06/03 1701) Weight:  [98.3 kg] 98.3 kg (06/04 0500)  Filed Weights   12/24/23 0848 12/24/23 1312 12/29/23 0500  Weight: 101.6 kg 98.5 kg 98.3 kg    Weight change:    Hemodynamic parameters for last 24 hours: CVP:  [0 mmHg-5 mmHg] 0 mmHg CO:  [5.7 L/min-7.5 L/min] 5.8 L/min CI:  [2.3 L/min/m2-3.3 L/min/m2] 2.3 L/min/m2  Intake/Output from previous day: 06/03 0701 - 06/04 0700 In: 6897.7 [P.O.:672; I.V.:2965.6; Blood:470; IV Piggyback:2790.1] Out: 6362 [Urine:4765; Blood:685; Chest Tube:412]  Intake/Output this shift: No intake/output data recorded.  Current Meds: Scheduled Meds:  acetaminophen   1,000 mg Oral Q6H   Or   acetaminophen  (TYLENOL ) oral liquid 160 mg/5 mL  1,000 mg Per Tube Q6H   aspirin  EC  325 mg Oral Daily   Or   aspirin   324 mg Per Tube Daily   bisacodyl  10 mg Oral Daily   Or   bisacodyl  10 mg Rectal Daily   Chlorhexidine Gluconate Cloth  6 each Topical Daily    docusate sodium  200 mg Oral Daily   insulin aspart  0-24 Units Subcutaneous Q4H   metoCLOPramide (REGLAN) injection  10 mg Intravenous Q6H   metoprolol tartrate  12.5 mg Oral BID   Or   metoprolol tartrate  12.5 mg Per Tube BID   [START ON 12/30/2023] pantoprazole  40 mg Oral Daily   pantoprazole (PROTONIX) IV  40 mg Intravenous QHS   rosuvastatin   40 mg Oral Daily   sertraline   100 mg Oral Daily   sodium chloride  flush  3 mL Intravenous Q12H   Continuous Infusions:  sodium chloride      sodium chloride  1 mL/hr at 12/29/23 0600   sodium chloride       ceFAZolin (ANCEF) IV 200 mL/hr at 12/29/23 0600   lactated ringers Stopped (12/29/23 0556)   lactated ringers 20 mL/hr at 12/29/23 0600   niCARDipine 1 mg/hr (12/29/23 0600)   phenylephrine (NEO-SYNEPHRINE) Adult infusion 20 mcg/min (12/29/23 0600)   PRN Meds:.sodium chloride , metoprolol tartrate, morphine injection, ondansetron  (ZOFRAN ) IV, oxyCODONE, sodium chloride  flush, traMADol  General appearance: alert, cooperative, and mild distress Neurologic: intact Heart: Regular rate and rhythm, no murmur.  Monitor showing sinus rhythm low 60s.  Pacer in VVI backup at 35 Lungs: Breath sounds clear, shallow.  Chest x-ray with no unexpected changes.  Chest tube drainage about  150 mL over the past 12 hours. Abdomen: Soft, no tenderness, few bowel sounds. Extremities: Well-perfused, no peripheral edema.  Left hand with no motor or sensory deficit.  Radial harvest dressing has a few spots of drainage. Wound: Sternotomy incision is covered with a dry Aquacel dressing.  Right lower extremity EVH incision is dry.  Lab Results: CBC: Recent Labs    12/28/23 1815 12/28/23 1852 12/29/23 0359  WBC 13.4*  --  11.7*  HGB 11.5* 10.9* 11.4*  HCT 34.0* 32.0* 33.7*  PLT 111*  --  116*   BMET:  Recent Labs    12/28/23 1815 12/28/23 1852 12/29/23 0359  NA 141 145 139  K 3.6 3.5 4.0  CL 108  --  104  CO2 24  --  24  GLUCOSE 108*  --  112*  BUN  12  --  10  CREATININE 0.91  --  0.88  CALCIUM  8.0*  --  8.6*    CMET: Lab Results  Component Value Date   WBC 11.7 (H) 12/29/2023   HGB 11.4 (L) 12/29/2023   HCT 33.7 (L) 12/29/2023   PLT 116 (L) 12/29/2023   GLUCOSE 112 (H) 12/29/2023   CHOL 130 09/23/2022   TRIG 81.0 09/23/2022   HDL 42.40 09/23/2022   LDLCALC 72 09/23/2022   ALT 15 09/23/2022   AST 16 09/23/2022   NA 139 12/29/2023   K 4.0 12/29/2023   CL 104 12/29/2023   CREATININE 0.88 12/29/2023   BUN 10 12/29/2023   CO2 24 12/29/2023   TSH 2.615 01/03/2021   PSA 0.13 09/23/2022   INR 1.4 (H) 12/28/2023   HGBA1C 5.4 12/24/2023      PT/INR:  Recent Labs    12/28/23 1319  LABPROT 17.5*  INR 1.4*   Radiology: DG Chest Port 1 View Result Date: 12/28/2023 CLINICAL DATA:  Status post coronary artery bypass graft. EXAM: PORTABLE CHEST 1 VIEW COMPARISON:  December 27, 2023. FINDINGS: The heart size and mediastinal contours are within normal limits. Sternotomy wires are noted. No pneumothorax is noted. Endotracheal and nasogastric tubes are noted. Right internal jugular catheter is noted. Right lung is clear. Mild left basilar subsegmental atelectasis is noted. The visualized skeletal structures are unremarkable. IMPRESSION: Support apparatus as noted above. Mild left basilar subsegmental atelectasis. Electronically Signed   By: Rosalene Colon M.D.   On: 12/28/2023 15:05     Assessment/Plan: S/P Procedure(s) (LRB): CORONARY ARTERY BYPASS GRAFTING X THREE, USING LEFT INTERNAL MAMMARY ARTERY, RIGHT ENDOSCOPIC HARVESTED GREATER SAPHENOUS VEIN GRAFT AND LEFT RADIAL ARTERY (N/A) ECHOCARDIOGRAM, TRANSESOPHAGEAL, INTRAOPERATIVE (N/A) SURGICAL PROCUREMENT, ARTERY, RADIAL (Left)  - Postop day 1 CABG x 71 and a 53 year old male with known coronary artery disease status post PCI to the LAD 10 years ago.  Presented with unstable angina and preserved biventricular function.  Stable vital signs and hemodynamics.  On nicardipine for radial  graft patency.  Weaning phenylephrine as tolerated.  On aspirin , low-dose beta-blocker, and statin.  Transition the nicardipine to oral amlodipine to optimize radial graft patency.  Plan to remove the A-line and chest tubes later today.  Mobilize as able.  - Heme: Mild expected acute blood loss anemia and thrombocytopenia.  Bleeding appears well-controlled.  Monitoring.  - Endo: No history of diabetes.  Preop hemoglobin A1c 5.4.   - Pulm: Oxygenating well.  Work on pulmonary hygiene and wean oxygen per protocol.  - GI: Tolerating clear liquids, abdomen benign.  Okay to advance diet slowly as tolerated  Myron G. Roddenberry, PA-C 12/29/2023 7:45 AM   Agree Doing well POD 1 progression Amlodipine for radial Will remove wires, a-line and foley  Gesselle Fitzsimons O Gabrelle Roca

## 2023-12-29 NOTE — Plan of Care (Signed)
  Problem: Education: Goal: Understanding of CV disease, CV risk reduction, and recovery process will improve Outcome: Progressing Goal: Individualized Educational Video(s) Outcome: Progressing   Problem: Activity: Goal: Ability to return to baseline activity level will improve Outcome: Progressing   Problem: Cardiovascular: Goal: Ability to achieve and maintain adequate cardiovascular perfusion will improve Outcome: Progressing   Problem: Health Behavior/Discharge Planning: Goal: Ability to safely manage health-related needs after discharge will improve Outcome: Progressing   Problem: Education: Goal: Knowledge of General Education information will improve Description: Including pain rating scale, medication(s)/side effects and non-pharmacologic comfort measures Outcome: Progressing   Problem: Health Behavior/Discharge Planning: Goal: Ability to manage health-related needs will improve Outcome: Progressing   Problem: Clinical Measurements: Goal: Ability to maintain clinical measurements within normal limits will improve Outcome: Progressing Goal: Will remain free from infection Outcome: Progressing Goal: Diagnostic test results will improve Outcome: Progressing Goal: Respiratory complications will improve Outcome: Progressing Goal: Cardiovascular complication will be avoided Outcome: Progressing   Problem: Activity: Goal: Risk for activity intolerance will decrease Outcome: Progressing   Problem: Nutrition: Goal: Adequate nutrition will be maintained Outcome: Progressing   Problem: Coping: Goal: Level of anxiety will decrease Outcome: Progressing   Problem: Elimination: Goal: Will not experience complications related to bowel motility Outcome: Progressing Goal: Will not experience complications related to urinary retention Outcome: Progressing   Problem: Pain Managment: Goal: General experience of comfort will improve and/or be controlled Outcome:  Progressing   Problem: Safety: Goal: Ability to remain free from injury will improve Outcome: Progressing   Problem: Skin Integrity: Goal: Risk for impaired skin integrity will decrease Outcome: Progressing   Problem: Education: Goal: Will demonstrate proper wound care and an understanding of methods to prevent future damage Outcome: Progressing Goal: Knowledge of disease or condition will improve Outcome: Progressing Goal: Knowledge of the prescribed therapeutic regimen will improve Outcome: Progressing Goal: Individualized Educational Video(s) Outcome: Progressing   Problem: Activity: Goal: Risk for activity intolerance will decrease Outcome: Progressing   Problem: Cardiac: Goal: Will achieve and/or maintain hemodynamic stability Outcome: Progressing   Problem: Clinical Measurements: Goal: Postoperative complications will be avoided or minimized Outcome: Progressing   Problem: Respiratory: Goal: Respiratory status will improve Outcome: Progressing   Problem: Skin Integrity: Goal: Wound healing without signs and symptoms of infection Outcome: Progressing Goal: Risk for impaired skin integrity will decrease Outcome: Progressing   Problem: Urinary Elimination: Goal: Ability to achieve and maintain adequate renal perfusion and functioning will improve Outcome: Progressing

## 2023-12-29 NOTE — Progress Notes (Signed)
   [address] [place] [address]             [phone]    POD # 1 CABG x 3 Ambulated earlier Stable day  BP 122/77 (BP Location: Right Arm)   Pulse 61   Temp 98 F (36.7 C) (Axillary)   Resp 13   Ht 6\' 2"  (1.88 m)   Wt 98.3 kg   SpO2 99%   BMI 27.84 kg/m    Intake/Output Summary (Last 24 hours) at 12/29/2023 1637 Last data filed at 12/29/2023 1500 Gross per 24 hour  Intake 2245.39 ml  Output 3335 ml  Net -1089.61 ml   CBG well controlled PM labs pending  Landon Pinion C. Luna Salinas, MD [facility] [phone]

## 2023-12-29 NOTE — Progress Notes (Addendum)
 NAME:  Francisco White, MRN:  [id number], DOB:  [birth year], LOS: 5 ADMISSION DATE:  12/24/2023, CONSULTATION DATE:  6/3 REFERRING MD:  Dr. Deloise Ferries, CHIEF COMPLAINT:  CABG x 3  History of Present Illness:  Patient is a 53 yo M with CAD previous PCI 2010, HTN, HLD, Depression presents to [facility] on 5/30 w/ unstable angina.   Patient has been having progressive chest pain since April 2025. Patient recently evaluated at [facility] mid May 2025. Coronary CTA concerning for RCA and multivessel disease. Patient then began having worsening chest pain on 5/23 and came to [facility] ED. Troponin negative and was sent home same day. On 5/30 presents to [facility] for cardiac cath showing 50-80% stenosis of LAD, 80% stenosis of circumflex and 70-00% stenosis of RCA. Patient admitted and started on heparin . Echo lvef 60-65%. TCTS consulted and plan for CABG. 6/3 patient taken for CABG x 3. Post op intubated/sedated brought to [facility] ICU. PCCM consulted.  EBL:  Blood Administration: none Xclamp Time:  50 min Pump Time:   Pertinent  Medical History   Past Medical History:  Diagnosis Date   CAD (coronary artery disease)    Depression 03/26/2013   Family history of heart disease    History of nuclear stress test 06/2011   exercise; negative for ischemia, normal pattern of perfusion    HTN (hypertension) 03/26/2013   Hyperlipidemia    Hypertension    Skin sensation disturbance 04/07/2017   Umbilical hernia 03/26/2013     Significant Hospital Events: Including procedures, antibiotic start and stop dates in addition to other pertinent events   5/30 heart cath showing multivessel CAD; started on heparin , TCTS consulted 6/3 CABG x 3 6/4 extubated yesterday afternoon, patient progressing  Interim History / Subjective:  No significant events overnight Patient up to chair, stable  Objective    Blood pressure (!) 112/52, pulse 62, temperature 100.2 F (37.9 C), resp. rate 19, height 6\' 2"  (1.88  m), weight 98.3 kg, SpO2 99%. CVP:  [0 mmHg-5 mmHg] 0 mmHg CO:  [5.7 L/min-7.5 L/min] 5.8 L/min CI:  [2.3 L/min/m2-3.3 L/min/m2] 2.3 L/min/m2  Vent Mode: CPAP;PSV FiO2 (%):  [40 %-50 %] 40 % Set Rate:  [4 bmp-16 bmp] 4 bmp Vt Set:  [650 mL] 650 mL PEEP:  [5 cmH20] 5 cmH20 Pressure Support:  [10 cmH20] 10 cmH20 Plateau Pressure:  [14 cmH20] 14 cmH20   Intake/Output Summary (Last 24 hours) at 12/29/2023 0734 Last data filed at 12/29/2023 0600 Gross per 24 hour  Intake 6897.71 ml  Output 6362 ml  Net 535.71 ml   Filed Weights   12/24/23 0848 12/24/23 1312 12/29/23 0500  Weight: 101.6 kg 98.5 kg 98.3 kg     Physical exam: General: Pleasant middle-aged adult male, sitting up in chair, in no distress HEENT: Normocephalic, PERRLA intact, pink moist mucous membranes, teeth intact Neuro: Alert and oriented x 4, follows all commands, pain controlled postsurgical Chest: Central sternotomy incision clean/dry/intact, clear diminished bilateral breath sounds, mediastinal chest tubes in place with minimal serosanguineous output CV: S1, S2, pacing wires intact, no MRG no JVD GI: Bowel sounds active, soft Extremities: Moves all extremities, no edema, left forearm wrapped with Ace wrap-post graft site GU: Foley intact  Resolved Hospital Problem list   Acute respiratory insufficiency, postop  Assessment & Plan:  Multivessel coronary artery disease s/p CABG x 3 with radial artery harvest P: Continue aspirin  and statin Continue chest tube management along with post operative care per  TCTS Continue pain control with tramadol, oxycodone, and morphine Continue to monitor chest tube output Continue to wean insulin infusion to sliding scale coverage with CBG monitoring Continue metoprolol twice daily Continue to mobilize per protocol up to chair Continue deep breathing exercises, and incentive spirometer Continue to wean Neo-Synephrine infusion, MAP goal greater than  65  Hypertension P: Continue to hold antihypertensives for now due to patient still being on low-dose Neo-Synephrine  Hyperlipidemia P: Continue statin: Crestor   Expected perioperative blood loss anemia 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 6/4 hemoglobin 11.4, platelets 116 P: Continue to monitor H/H and platelet counts daily per CBC Continue to monitor for signs of bleeding, and monitor chest tube output Transfuse for hemoglobin less than 7  Depression P: Continue Zoloft    Best Practice (right click and "Reselect all SmartList Selections" daily)   Diet/type: per primary  DVT prophylaxis: SCD GI prophylaxis: PPI Lines: Central line and Arterial Line Foley:  Yes, and it is still needed Code Status:  full code Last date of multidisciplinary goals of care discussion [per primary]  Rey Catholic AGACNP-BC   [facility] 12/29/2023, 7:49 AM  Please see [email or web address] for pager details.  From 7A-7P if no response, please call [phone]. After hours, please call ELink [phone].     Critical care attending attestation note: I agree with the Advanced Practitioner's note, impression, and recommendations as outlined. I have taken an independent interval history, reviewed the chart and examined the patient. The following reflects my medical decision making and independent critical care time   Synopsis of assessment and plan: 53 year old male with coronary artery disease who underwent CABG x 3 yesterday Extubated per rapid weaning protocol No overnight issues   Physical exam: General: Middle-aged male, sitting on recliner HEENT: [place]/AT, eyes anicteric.  moist mucus membranes Neuro: Alert, awake following commands Chest: Central sternotomy incision looks clean and dry.  Coarse breath sounds, no wheezes or rhonchi.  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

## 2023-12-30 ENCOUNTER — Inpatient Hospital Stay (HOSPITAL_COMMUNITY)

## 2023-12-30 LAB — CBC
HCT: 36.5 % — ABNORMAL LOW (ref 39.0–52.0)
Hemoglobin: 12.1 g/dL — ABNORMAL LOW (ref 13.0–17.0)
MCH: 30.5 pg (ref 26.0–34.0)
MCHC: 33.2 g/dL (ref 30.0–36.0)
MCV: 91.9 fL (ref 80.0–100.0)
Platelets: 122 10*3/uL — ABNORMAL LOW (ref 150–400)
RBC: 3.97 MIL/uL — ABNORMAL LOW (ref 4.22–5.81)
RDW: 12.4 % (ref 11.5–15.5)
WBC: 13 10*3/uL — ABNORMAL HIGH (ref 4.0–10.5)
nRBC: 0 % (ref 0.0–0.2)

## 2023-12-30 LAB — BASIC METABOLIC PANEL WITH GFR
Anion gap: 4 — ABNORMAL LOW (ref 5–15)
BUN: 11 mg/dL (ref 6–20)
CO2: 30 mmol/L (ref 22–32)
Calcium: 8.7 mg/dL — ABNORMAL LOW (ref 8.9–10.3)
Chloride: 101 mmol/L (ref 98–111)
Creatinine, Ser: 0.97 mg/dL (ref 0.61–1.24)
GFR, Estimated: >60 mL/min (ref >60)
Glucose, Bld: 127 mg/dL — ABNORMAL HIGH (ref 70–99)
Potassium: 3.6 mmol/L (ref 3.5–5.1)
Sodium: 135 mmol/L (ref 135–145)

## 2023-12-30 LAB — GLUCOSE, CAPILLARY
Glucose-Capillary: 115 mg/dL — ABNORMAL HIGH (ref 70–99)
Glucose-Capillary: 114 mg/dL — ABNORMAL HIGH (ref 70–99)
Glucose-Capillary: 118 mg/dL — ABNORMAL HIGH (ref 70–99)
Glucose-Capillary: 128 mg/dL — ABNORMAL HIGH (ref 70–99)
Glucose-Capillary: 125 mg/dL — ABNORMAL HIGH (ref 70–99)

## 2023-12-30 MED ORDER — BETHANECHOL CHLORIDE 10 MG PO TABS
10.0000 mg | ORAL_TABLET | Freq: Three times a day (TID) | ORAL | Status: DC
  Administered 2023-12-31: 10 mg via ORAL
  Filled 2023-12-30 (×2): qty 1

## 2023-12-30 MED ORDER — POTASSIUM CHLORIDE CRYS ER 20 MEQ PO TBCR
40.0000 meq | EXTENDED_RELEASE_TABLET | Freq: Every day | ORAL | Status: DC
  Administered 2023-12-31 – 2024-01-01 (×2): 40 meq via ORAL
  Filled 2023-12-30 (×3): qty 2

## 2023-12-30 MED ORDER — POTASSIUM CHLORIDE CRYS ER 20 MEQ PO TBCR
20.0000 meq | EXTENDED_RELEASE_TABLET | ORAL | Status: AC
  Administered 2023-12-30 (×3): 20 meq via ORAL
  Filled 2023-12-30 (×2): qty 1

## 2023-12-30 MED ORDER — FUROSEMIDE 40 MG PO TABS
40.0000 mg | ORAL_TABLET | Freq: Every day | ORAL | Status: DC
  Administered 2023-12-30 – 2024-01-01 (×3): 40 mg via ORAL
  Filled 2023-12-30 (×3): qty 1

## 2023-12-30 MED ORDER — SODIUM CHLORIDE 0.9% FLUSH: 3.0000 mL | INTRAVENOUS | Status: DC | PRN

## 2023-12-30 MED ORDER — SODIUM CHLORIDE 0.9 % IV SOLN: 250.0000 mL | INTRAVENOUS | Status: AC | PRN

## 2023-12-30 MED ORDER — ORAL CARE MOUTH RINSE: 15.0000 mL | OROMUCOSAL | Status: DC | PRN

## 2023-12-30 MED ORDER — INSULIN ASPART 100 UNIT/ML IJ SOLN
0.0000 [IU] | Freq: Three times a day (TID) | INTRAMUSCULAR | Status: DC
  Administered 2023-12-30: 2 [IU] via SUBCUTANEOUS

## 2023-12-30 MED ORDER — ~~LOC~~ CARDIAC SURGERY, PATIENT & FAMILY EDUCATION: Freq: Once | Status: AC

## 2023-12-30 MED ORDER — SODIUM CHLORIDE 0.9% FLUSH
3.0000 mL | Freq: Two times a day (BID) | INTRAVENOUS | Status: DC
  Administered 2023-12-30 – 2023-12-31 (×3): 3 mL via INTRAVENOUS

## 2023-12-30 MED FILL — Heparin Sodium (Porcine) Inj 1000 Unit/ML: INTRAMUSCULAR | Qty: 10 | Status: AC

## 2023-12-30 MED FILL — Sodium Bicarbonate IV Soln 8.4%: INTRAVENOUS | Qty: 50 | Status: AC

## 2023-12-30 MED FILL — Sodium Chloride IV Soln 0.9%: INTRAVENOUS | Qty: 2000 | Status: AC

## 2023-12-30 MED FILL — Mannitol IV Soln 20%: INTRAVENOUS | Qty: 500 | Status: AC

## 2023-12-30 MED FILL — Electrolyte-R (PH 7.4) Solution: INTRAVENOUS | Qty: 3000 | Status: AC

## 2023-12-30 MED FILL — Calcium Chloride Inj 10%: INTRAVENOUS | Qty: 10 | Status: AC

## 2023-12-30 NOTE — Progress Notes (Addendum)
 TCTS DAILY ICU PROGRESS NOTE                   [address]            [place] [address]          [phone]   2 Days Post-Op Procedure(s) (LRB): CORONARY ARTERY BYPASS GRAFTING X THREE, USING LEFT INTERNAL MAMMARY ARTERY, RIGHT ENDOSCOPIC HARVESTED GREATER SAPHENOUS VEIN GRAFT AND LEFT RADIAL ARTERY (N/A) ECHOCARDIOGRAM, TRANSESOPHAGEAL, INTRAOPERATIVE (N/A) SURGICAL PROCUREMENT, ARTERY, RADIAL (Left)  Total Length of Stay:  LOS: 6 days   Subjective: Sitting up in bed, walked a full lap in the unit earlier this morning.  Pain controlled, no nausea but not much appetite yet.  Neo off since yesterday morning.    Objective: Vital signs in last 24 hours: Temp:  [98 F (36.7 C)-99.7 F (37.6 C)] 98.2 F (36.8 C) (06/05 0312) Pulse Rate:  [60-76] 63 (06/05 0700) Cardiac Rhythm: Normal sinus rhythm (06/05 0400) Resp:  [9-22] 11 (06/05 0700) BP: (115-142)/(60-89) 121/81 (06/05 0600) SpO2:  [92 %-100 %] 97 % (06/05 0700) Arterial Line BP: (95-149)/(55-62) 149/62 (06/04 0745) Weight:  [100.1 kg] 100.1 kg (06/05 0500)  Filed Weights   12/24/23 1312 12/29/23 0500 12/30/23 0500  Weight: 98.5 kg 98.3 kg 100.1 kg    Weight change: 1.76 kg   Hemodynamic parameters for last 24 hours: CVP:  [2 mmHg] 2 mmHg  Intake/Output from previous day: 06/04 0701 - 06/05 0700 In: 550.7 [P.O.:240; I.V.:10.5; IV Piggyback:300.1] Out: 2420 [Urine:1890; Chest Tube:530]  Intake/Output this shift: No intake/output data recorded.  Current Meds: Scheduled Meds:  acetaminophen   1,000 mg Oral Q6H   Or   acetaminophen  (TYLENOL ) oral liquid 160 mg/5 mL  1,000 mg Per Tube Q6H   amLODipine  5 mg Oral Daily   aspirin  EC  325 mg Oral Daily   Or   aspirin   324 mg Per Tube Daily   bisacodyl  10 mg Oral Daily   Or   bisacodyl  10 mg Rectal Daily   Chlorhexidine Gluconate Cloth  6 each Topical Daily   docusate sodium  200 mg Oral Daily   enoxaparin  (LOVENOX ) injection  40 mg  Subcutaneous QHS   insulin aspart  0-24 Units Subcutaneous Q4H   metoprolol tartrate  12.5 mg Oral BID   pantoprazole  40 mg Oral Daily   potassium chloride   20 mEq Oral Q4H   rosuvastatin   40 mg Oral Daily   sertraline   100 mg Oral Daily   Continuous Infusions:   PRN Meds:.metoprolol tartrate, morphine injection, ondansetron  (ZOFRAN ) IV, mouth rinse, oxyCODONE, traMADol  General appearance: alert, cooperative, and no distress Neurologic: intact Heart: Regular rate and rhythm, no murmur.  Monitor showing sinus rhythm low 60s.   Lungs: Breath sounds clear, shallow.  Chest x-ray with no unexpected changes, mild left basilar ATX.  Chest tube drainage about 220 mL over the past 12 but is tapering off over past 6 hours. Abdomen: Soft, no tenderness Extremities: Well-perfused, no peripheral edema.  Left hand with no motor or sensory deficit.  Radial harvest dressing is dry. Wound: Sternotomy incision is covered with a dry Aquacel dressing.  Right lower extremity EVH incision is dry.  Lab Results: CBC: Recent Labs    12/29/23 1616 12/30/23 0439  WBC 13.9* 13.0*  HGB 12.9* 12.1*  HCT 39.3 36.5*  PLT 125* 122*   BMET:  Recent Labs    12/29/23 1616 12/30/23 0439  NA 136  135  K 4.0 3.6  CL 101 101  CO2 28 30  GLUCOSE 122* 127*  BUN 11 11  CREATININE 0.99 0.97  CALCIUM  9.0 8.7*    CMET: Lab Results  Component Value Date   WBC 13.0 (H) 12/30/2023   HGB 12.1 (L) 12/30/2023   HCT 36.5 (L) 12/30/2023   PLT 122 (L) 12/30/2023   GLUCOSE 127 (H) 12/30/2023   CHOL 130 09/23/2022   TRIG 81.0 09/23/2022   HDL 42.40 09/23/2022   LDLCALC 72 09/23/2022   ALT 15 09/23/2022   AST 16 09/23/2022   NA 135 12/30/2023   K 3.6 12/30/2023   CL 101 12/30/2023   CREATININE 0.97 12/30/2023   BUN 11 12/30/2023   CO2 30 12/30/2023   TSH 2.615 01/03/2021   PSA 0.13 09/23/2022   INR 1.4 (H) 12/28/2023   HGBA1C 5.4 12/24/2023      PT/INR:  Recent Labs    12/28/23 1319  LABPROT  17.5*  INR 1.4*   Radiology: DG Chest Port 1 View Result Date: 12/30/2023 CLINICAL DATA:  Evaluate for pneumothorax.  Status post CABG EXAM: PORTABLE CHEST 1 VIEW COMPARISON:  12/29/2023. FINDINGS: There is a right IJ catheter with tip in the projection of the distal SVC. Status post median sternotomy and CABG procedure. Stable position of left chest tube with tip overlying the left lower lung. No pneumothorax identified. Improved aeration to the left base. Visualized osseous structures are unremarkable. IMPRESSION: 1. No pneumothorax identified. 2. Improved aeration to the left base. Electronically Signed   By: Kimberley Penman M.D.   On: 12/30/2023 07:11     Assessment/Plan: S/P Procedure(s) (LRB): CORONARY ARTERY BYPASS GRAFTING X THREE, USING LEFT INTERNAL MAMMARY ARTERY, RIGHT ENDOSCOPIC HARVESTED GREATER SAPHENOUS VEIN GRAFT AND LEFT RADIAL ARTERY (N/A) ECHOCARDIOGRAM, TRANSESOPHAGEAL, INTRAOPERATIVE (N/A) SURGICAL PROCUREMENT, ARTERY, RADIAL (Left)  - Postop day 2 CABG x 85 and a 53 year old male with known coronary artery disease status post PCI to the LAD 10 years ago.  Presented with unstable angina and preserved biventricular function.  Stable vital signs, cardiac rhythm  and hemodynamics.  On aspirin , low-dose beta-blocker, and statin.  Started   amlodipine POD1 to optimize radial graft patency. D/C the chest tubes and cordis. Plan transfer to Progressive Care.  - Heme: Mild expected acute blood loss anemia and thrombocytopenia. Hct and Plt count stable, monitoring.  - Endo: No history of diabetes.  Preop hemoglobin A1c 5.4. CBG's in low 100's and no coverage required. OK to d/c CBG's and SSI.   - Pulm: Oxygenating well.  Work on pulmonary hygiene and wean oxygen per protocol.  - GI: Tolerating clear liquids, abdomen benign.  Okay to advance diet slowly as tolerated  -RENAL: normal function, good response to Lasix IV x 1 yesterday. Wt below pre-op.   -DVT PPX: SCD's, enoxaparin ,  advance activity as tolerated.    Myron G. Roddenberry, PA-C 12/30/2023 7:15 AM  Agree Doing well Floor  Khalee Mazo [place]

## 2023-12-30 NOTE — Progress Notes (Signed)
 NAME:  KASSIM GUERTIN, MRN:  [id number], DOB:  [birth year], LOS: 6 ADMISSION DATE:  12/24/2023, CONSULTATION DATE:  6/3 REFERRING MD:  Dr. Deloise Ferries, CHIEF COMPLAINT:  CABG x 3  History of Present Illness:  Patient is a 53 yo M with CAD previous PCI 2010, HTN, HLD, Depression presents to [facility] on 5/30 w/ unstable angina.   Patient has been having progressive chest pain since April 2025. Patient recently evaluated at [facility] mid May 2025. Coronary CTA concerning for RCA and multivessel disease. Patient then began having worsening chest pain on 5/23 and came to [facility] ED. Troponin negative and was sent home same day. On 5/30 presents to [facility] for cardiac cath showing 50-80% stenosis of LAD, 80% stenosis of circumflex and 70-00% stenosis of RCA. Patient admitted and started on heparin . Echo lvef 60-65%. TCTS consulted and plan for CABG. 6/3 patient taken for CABG x 3. Post op intubated/sedated brought to [facility] ICU. PCCM consulted.  EBL:  Blood Administration: none Xclamp Time:  50 min Pump Time:   Pertinent  Medical History   Past Medical History:  Diagnosis Date   CAD (coronary artery disease)    Depression 03/26/2013   Family history of heart disease    History of nuclear stress test 06/2011   exercise; negative for ischemia, normal pattern of perfusion    HTN (hypertension) 03/26/2013   Hyperlipidemia    Hypertension    Skin sensation disturbance 04/07/2017   Umbilical hernia 03/26/2013     Significant Hospital Events: Including procedures, antibiotic start and stop dates in addition to other pertinent events   5/30 heart cath showing multivessel CAD; started on heparin , TCTS consulted 6/3 CABG x 3 6/4 extubated yesterday afternoon, patient progressing 6/5 Mobilizing, patient progressing post-CABG day 2   Interim History / Subjective:  No significant events overnight Patient progressing, complains of some soreness   Objective    Blood pressure 120/78,  pulse 66, temperature 98.3 F (36.8 C), temperature source Oral, resp. rate 13, height 6\' 2"  (1.88 m), weight 100.1 kg, SpO2 94%.        Intake/Output Summary (Last 24 hours) at 12/30/2023 1009 Last data filed at 12/30/2023 0700 Gross per 24 hour  Intake 540.14 ml  Output 1160 ml  Net -619.86 ml   Filed Weights   12/24/23 1312 12/29/23 0500 12/30/23 0500  Weight: 98.5 kg 98.3 kg 100.1 kg     Physical exam: General: Pleasant middle-age adult male, sitting up in ICU bed, NAD HEENT: Normocephalic, PERRLA intact, pink moist mucous membranes, teeth intact Neuro: Alert and orient x 4, follows commands Chest, Central sternotomy incision clean dry intact, clear diminished breath sounds bilaterally, mediastinal chest tubes with minimal serosanguineous output CV: S1, S2, pacing wires intact, no MRG no JVD GI: Bowel sounds active, soft Extremities: Moves all extremities, no edema, left forearm wrapped with Ace wrap post graft site GU: Foley intact  Resolved Hospital Problem list   Acute respiratory insufficiency, postop  Assessment & Plan:  Multivessel coronary artery disease s/p CABG x 3 with radial artery harvest P: Continue chest tube management along with post op care with hopeful removal of chest tubes later today-Per TCTS Continue aspirin  and statin Continue amlodipine for radial graft patency Continue metoprolol twice daily Continue to mobilize per protocol, up to chair daily Continue deep breathing exercises/incentive spirometer Discontinue pressors-patient has been off since yesterday on 6/4 Patient should be able to transfer out of ICU to progressive level of care,  PCCM will sign off   Hypertension P: Continue low-dose beta-blocker Continue amlodipine  Hyperlipidemia P: Continue statin  Expected perioperative blood loss anemia 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 Platelets/hemoglobin stable P: Continue to monitor H/H and platelet counts daily per CBC Continue to monitor for  signs of bleeding and monitor chest tube output Continue to transfuse for hemoglobin less than 7  Urinary Retention: Foley removed yesterday 6/4 In and out x 1 overnight P: May need another in and out cath  Continue to monitor intake and output Bladder scan q 6hrs   Depression P: Continue Zoloft    Best Practice (right click and "Reselect all SmartList Selections" daily)   Diet/type: per primary  DVT prophylaxis: SCD GI prophylaxis: PPI Lines: N/A Foley:  n/a  Code Status:  full code Last date of multidisciplinary goals of care discussion [per primary]  Rey Catholic AGACNP-BC   [facility] 12/30/2023, 10:09 AM  Please see [email or web address] for pager details.  From 7A-7P if no response, please call [phone]. After hours, please call ELink [phone].

## 2023-12-30 NOTE — Plan of Care (Signed)
  Problem: Activity: Goal: Ability to return to baseline activity level will improve Outcome: Progressing   Problem: Cardiovascular: Goal: Ability to achieve and maintain adequate cardiovascular perfusion will improve Outcome: Progressing   Problem: Education: Goal: Knowledge of General Education information will improve Description: Including pain rating scale, medication(s)/side effects and non-pharmacologic comfort measures Outcome: Progressing   Problem: Clinical Measurements: Goal: Ability to maintain clinical measurements within normal limits will improve Outcome: Progressing Goal: Respiratory complications will improve Outcome: Progressing   Problem: Activity: Goal: Risk for activity intolerance will decrease Outcome: Progressing

## 2023-12-31 LAB — GLUCOSE, CAPILLARY
Glucose-Capillary: 108 mg/dL — ABNORMAL HIGH (ref 70–99)
Glucose-Capillary: 140 mg/dL — ABNORMAL HIGH (ref 70–99)
Glucose-Capillary: 93 mg/dL (ref 70–99)
Glucose-Capillary: 98 mg/dL (ref 70–99)
Glucose-Capillary: 88 mg/dL (ref 70–99)

## 2023-12-31 LAB — BPAM RBC
Blood Product Expiration Date: 202506252359
Blood Product Expiration Date: 202506252359
ISSUE DATE / TIME: 202505300718
Unit Type and Rh: 6200
Unit Type and Rh: 6200

## 2023-12-31 LAB — BASIC METABOLIC PANEL WITH GFR
Anion gap: 11 (ref 5–15)
BUN: 12 mg/dL (ref 6–20)
CO2: 25 mmol/L (ref 22–32)
Calcium: 9.1 mg/dL (ref 8.9–10.3)
Chloride: 102 mmol/L (ref 98–111)
Creatinine, Ser: 1.11 mg/dL (ref 0.61–1.24)
GFR, Estimated: >60 mL/min (ref >60)
Glucose, Bld: 117 mg/dL — ABNORMAL HIGH (ref 70–99)
Potassium: 4.3 mmol/L (ref 3.5–5.1)
Sodium: 138 mmol/L (ref 135–145)

## 2023-12-31 LAB — CBC
HCT: 35.5 % — ABNORMAL LOW (ref 39.0–52.0)
Hemoglobin: 11.9 g/dL — ABNORMAL LOW (ref 13.0–17.0)
MCH: 30.2 pg (ref 26.0–34.0)
MCHC: 33.5 g/dL (ref 30.0–36.0)
MCV: 90.1 fL (ref 80.0–100.0)
Platelets: 121 10*3/uL — ABNORMAL LOW (ref 150–400)
RBC: 3.94 MIL/uL — ABNORMAL LOW (ref 4.22–5.81)
RDW: 12.2 % (ref 11.5–15.5)
WBC: 9.7 10*3/uL (ref 4.0–10.5)
nRBC: 0 % (ref 0.0–0.2)

## 2023-12-31 LAB — TYPE AND SCREEN
ABO/RH(D): A POS
Antibody Screen: NEGATIVE
Unit division: 0
Unit division: 0

## 2023-12-31 NOTE — Progress Notes (Incomplete)
 Entered patient's room to give night time meds. Asked the patient if he was voiding okay. Patient had a perplex look on his face. Asked patient if he was disoriented. Patient answered all questions appropriately.  Patient still seemed a little disoriented. Patient eventually came around. CBG checked 88. BP good. Other vitals stable.   Patient eventually came around to his normal self. Gave patient snacks/juice. Placed bed alarm on in case he gets disoriented. Patient

## 2023-12-31 NOTE — Progress Notes (Signed)
 CARDIAC REHAB PHASE I   PRE:  Rate/Rhythm: 82 SR    BP: sitting     SpO2:   MODE:  Ambulation: 470 ft   POST:  Rate/Rhythm: 88 SR    BP: sitting 146/77     SpO2: 96 RA  Pt has been ambulating independently with RW (albeit at low height). Ambulated without RW, independent, only standby assist. No c/o, VSS. Prefers to sit up in bed. Practicing IS.   Discussed with pt IS, sternal precautions, walking for exercise, diet, and CRPII. Pt receptive. Will refer to [place] CRPII.  [id number]   Francisco White BS, ACSM-CEP 12/31/2023 11:14 AM

## 2023-12-31 NOTE — Progress Notes (Addendum)
 Entered patient's room to give night time meds. Asked the patient if he was voiding okay. Patient had a perplex look on his face. Asked patient if he was disoriented. Patient answered all questions appropriately.  Patient still seemed a little disoriented. Patient eventually came around. CBG checked 88. BP good. Other vitals stable. Patient did c/o nausea. Zofran  given  Patient eventually came around to his normal self. Gave patient snacks/juice. Placed bed alarm on in case he gets disoriented. Patient voiced understanding.   Will continue to monitor

## 2023-12-31 NOTE — Progress Notes (Signed)
 [address]            [place] [address]          [phone]   3 Days Post-Op Procedure(s) (LRB): CORONARY ARTERY BYPASS GRAFTING X THREE, USING LEFT INTERNAL MAMMARY ARTERY, RIGHT ENDOSCOPIC HARVESTED GREATER SAPHENOUS VEIN GRAFT AND LEFT RADIAL ARTERY (N/A) ECHOCARDIOGRAM, TRANSESOPHAGEAL, INTRAOPERATIVE (N/A) SURGICAL PROCUREMENT, ARTERY, RADIAL (Left)  Total Length of Stay:  LOS: 7 days   Subjective: Up walking around in the room and has made several laps in the hall.  Feels he is progressing, no new concerns.  On RA.  Passing gas, no BM yet.    Objective: Vital signs in last 24 hours: Temp:  [98 F (36.7 C)-98.8 F (37.1 C)] 98 F (36.7 C) (06/06 0655) Pulse Rate:  [65-74] 65 (06/06 0655) Cardiac Rhythm: Normal sinus rhythm (06/05 1900) Resp:  [11-18] 15 (06/06 0655) BP: (111-131)/(63-78) 111/71 (06/06 0655) SpO2:  [92 %-98 %] 97 % (06/06 0655) Weight:  [98.2 kg] 98.2 kg (06/06 0655)  Filed Weights   12/29/23 0500 12/30/23 0500 12/31/23 0655  Weight: 98.3 kg 100.1 kg 98.2 kg    Weight change: -1.9 kg    Intake/Output from previous day: 06/05 0701 - 06/06 0700 In: -  Out: 500 [Urine:500]  Intake/Output this shift: No intake/output data recorded.  Current Meds: Scheduled Meds:  acetaminophen   1,000 mg Oral Q6H   Or   acetaminophen  (TYLENOL ) oral liquid 160 mg/5 mL  1,000 mg Per Tube Q6H   amLODipine  5 mg Oral Daily   aspirin  EC  325 mg Oral Daily   Or   aspirin   324 mg Per Tube Daily   bethanechol  10 mg Oral TID   bisacodyl  10 mg Oral Daily   Or   bisacodyl  10 mg Rectal Daily   Chlorhexidine Gluconate Cloth  6 each Topical Daily   docusate sodium  200 mg Oral Daily   enoxaparin  (LOVENOX ) injection  40 mg Subcutaneous QHS   furosemide  40 mg Oral Daily   insulin aspart  0-24 Units Subcutaneous TID AC & HS   metoprolol tartrate  12.5 mg Oral BID   pantoprazole  40 mg Oral Daily   potassium chloride   40  mEq Oral Daily   rosuvastatin   40 mg Oral Daily   sertraline   100 mg Oral Daily   sodium chloride  flush  3 mL Intravenous Q12H   Continuous Infusions:  sodium chloride       PRN Meds:.sodium chloride , metoprolol tartrate, morphine injection, ondansetron  (ZOFRAN ) IV, mouth rinse, oxyCODONE, sodium chloride  flush, traMADol  General appearance: alert, cooperative, and no distress Neurologic: intact Heart: Regular rate and rhythm, no murmur.  Monitor showing sinus rhythm  60s-70s.   Lungs: Breath sounds clear and normal work of breathing.  Chest tubes are out. Abdomen: Soft, no tenderness Extremities: Well-perfused, no peripheral edema.  Left hand with no motor or sensory deficit.  Radial harvest incision is open to air and is intact and dry. Wound: Sternotomy incision is covered with a dry Aquacel dressing.  Right lower extremity EVH incision is dry.  Lab Results: CBC: Recent Labs    12/29/23 1616 12/30/23 0439  WBC 13.9* 13.0*  HGB 12.9* 12.1*  HCT 39.3 36.5*  PLT 125* 122*   BMET:  Recent Labs    12/29/23 1616 12/30/23 0439  NA 136 135  K 4.0 3.6  CL 101 101  CO2 28 30  GLUCOSE 122* 127*  BUN 11 11  CREATININE 0.99 0.97  CALCIUM  9.0 8.7*    CMET: Lab Results  Component Value Date   WBC 13.0 (H) 12/30/2023   HGB 12.1 (L) 12/30/2023   HCT 36.5 (L) 12/30/2023   PLT 122 (L) 12/30/2023   GLUCOSE 127 (H) 12/30/2023   CHOL 130 09/23/2022   TRIG 81.0 09/23/2022   HDL 42.40 09/23/2022   LDLCALC 72 09/23/2022   ALT 15 09/23/2022   AST 16 09/23/2022   NA 135 12/30/2023   K 3.6 12/30/2023   CL 101 12/30/2023   CREATININE 0.97 12/30/2023   BUN 11 12/30/2023   CO2 30 12/30/2023   TSH 2.615 01/03/2021   PSA 0.13 09/23/2022   INR 1.4 (H) 12/28/2023   HGBA1C 5.4 12/24/2023      PT/INR:  Recent Labs    12/28/23 1319  LABPROT 17.5*  INR 1.4*   Radiology: No results found.    Assessment/Plan: S/P Procedure(s) (LRB): CORONARY ARTERY BYPASS GRAFTING X  THREE, USING LEFT INTERNAL MAMMARY ARTERY, RIGHT ENDOSCOPIC HARVESTED GREATER SAPHENOUS VEIN GRAFT AND LEFT RADIAL ARTERY (N/A) ECHOCARDIOGRAM, TRANSESOPHAGEAL, INTRAOPERATIVE (N/A) SURGICAL PROCUREMENT, ARTERY, RADIAL (Left)  - Postop day 3 CABG x 3. Presented with unstable angina and preserved biventricular function.  Stable vital signs andcardiac rhythm.  On aspirin , low-dose beta-blocker, and statin.  Started   amlodipine POD1 to optimize radial graft patency. Independent with mobility.   - Heme: Mild expected acute blood loss anemia and thrombocytopenia. Hct and Plt count stable, monitoring.  - Endo: No history of diabetes.  Preop hemoglobin A1c 5.4. CBG's in low 100's and no coverage required. Will d/c CBG's and SSI.   - Pulm: Oxygenating well.  Work on pulmonary hygiene and wean oxygen per protocol.  - GI: abdomen benign.  Diet as desired.  -RENAL: normal function,  Wt below pre-op. Continue oral Lasix another day.   -DVT PPX: SCD's, enoxaparin , advance activity as tolerated.   -Dispo: anticipate discharge to home 1-2 days.    Rolen Conger G. Kamyah Wilhelmsen, PA-C 12/31/2023 7:23 AM

## 2024-01-01 ENCOUNTER — Inpatient Hospital Stay (HOSPITAL_COMMUNITY)

## 2024-01-01 ENCOUNTER — Other Ambulatory Visit (HOSPITAL_COMMUNITY): Payer: Self-pay

## 2024-01-01 MED ORDER — ACETAMINOPHEN 325 MG PO TABS: 650.0000 mg | ORAL_TABLET | Freq: Four times a day (QID) | ORAL | Status: DC

## 2024-01-01 MED ORDER — METOPROLOL SUCCINATE ER 25 MG PO TB24
25.0000 mg | ORAL_TABLET | Freq: Every day | ORAL | 11 refills | Status: DC
  Filled 2024-01-01: qty 30, 30d supply, fill #0

## 2024-01-01 MED ORDER — TRAMADOL HCL 50 MG PO TABS
50.0000 mg | ORAL_TABLET | Freq: Four times a day (QID) | ORAL | 0 refills | Status: AC | PRN
  Filled 2024-01-01: qty 28, 7d supply, fill #0

## 2024-01-01 MED ORDER — ASPIRIN 325 MG PO TBEC
325.0000 mg | DELAYED_RELEASE_TABLET | Freq: Every day | ORAL | 3 refills | Status: DC
Start: 2024-01-01 — End: 2024-02-25
  Filled 2024-01-01: qty 100, 100d supply, fill #0

## 2024-01-01 MED ORDER — AMLODIPINE BESYLATE 5 MG PO TABS
5.0000 mg | ORAL_TABLET | Freq: Every day | ORAL | 2 refills | Status: DC
Start: 2024-01-01 — End: 2024-01-14
  Filled 2024-01-01: qty 30, 30d supply, fill #0

## 2024-01-01 NOTE — Progress Notes (Signed)
 [address]            [place] [address]          [phone]   4 Days Post-Op Procedure(s) (LRB): CORONARY ARTERY BYPASS GRAFTING X THREE, USING LEFT INTERNAL MAMMARY ARTERY, RIGHT ENDOSCOPIC HARVESTED GREATER SAPHENOUS VEIN GRAFT AND LEFT RADIAL ARTERY (N/A) ECHOCARDIOGRAM, TRANSESOPHAGEAL, INTRAOPERATIVE (N/A) SURGICAL PROCUREMENT, ARTERY, RADIAL (Left)  Total Length of Stay:  LOS: 8 days   Subjective: Sitting up eating breakfast.  Feels well.  No new concerns.  He is independent with mobility and transfers.  Remains on room air.  Bowel movement yesterday.  He would like to return home today.   Objective: Vital signs in last 24 hours: Temp:  [98.1 F (36.7 C)-99 F (37.2 C)] 98.4 F (36.9 C) (06/07 0258) Pulse Rate:  [65-78] 74 (06/07 0755) Cardiac Rhythm: Normal sinus rhythm (06/06 2300) Resp:  [14-20] 15 (06/07 0755) BP: (109-129)/(67-87) 120/68 (06/07 0755) SpO2:  [90 %-99 %] 98 % (06/07 0755) Weight:  [97.2 kg] 97.2 kg (06/07 0258)  Filed Weights   12/30/23 0500 12/31/23 0655 01/01/24 0258  Weight: 100.1 kg 98.2 kg 97.2 kg    Weight change: -1.04 kg    Intake/Output from previous day: 06/06 0701 - 06/07 0700 In: 720 [P.O.:720] Out: -   Intake/Output this shift: No intake/output data recorded.  Current Meds: Scheduled Meds:  acetaminophen   1,000 mg Oral Q6H   Or   acetaminophen  (TYLENOL ) oral liquid 160 mg/5 mL  1,000 mg Per Tube Q6H   amLODipine   5 mg Oral Daily   aspirin  EC  325 mg Oral Daily   Or   aspirin   324 mg Per Tube Daily   bethanechol   10 mg Oral TID   bisacodyl   10 mg Oral Daily   Or   bisacodyl   10 mg Rectal Daily   Chlorhexidine  Gluconate Cloth  6 each Topical Daily   docusate sodium   200 mg Oral Daily   enoxaparin  (LOVENOX ) injection  40 mg Subcutaneous QHS   furosemide   40 mg Oral Daily   metoprolol  tartrate  12.5 mg Oral BID   pantoprazole   40 mg Oral Daily   potassium chloride   40 mEq Oral  Daily   rosuvastatin   40 mg Oral Daily   sertraline   100 mg Oral Daily   sodium chloride  flush  3 mL Intravenous Q12H   Continuous Infusions:    PRN Meds:.metoprolol  tartrate, morphine  injection, ondansetron  (ZOFRAN ) IV, mouth rinse, oxyCODONE , sodium chloride  flush, traMADol   General appearance: alert, cooperative, and no distress Neurologic: intact Heart: Regular rate and rhythm, no murmur.  Monitor showing sinus rhythm  60s-70s.  No significant arrhythmias Lungs: Breath sounds clear and normal work of breathing.  Chest tubes are out. Abdomen: Soft, no tenderness Extremities: Well-perfused, no peripheral edema.  Left hand with no motor or sensory deficit.  Radial harvest incision is open to air and is intact and dry. Wound: Sternotomy incision and the EVH incision are intact and dry.  Lab Results: CBC: Recent Labs    12/30/23 0439 12/31/23 0943  WBC 13.0* 9.7  HGB 12.1* 11.9*  HCT 36.5* 35.5*  PLT 122* 121*   BMET:  Recent Labs    12/30/23 0439 12/31/23 0943  NA 135 138  K 3.6 4.3  CL 101 102  CO2 30 25  GLUCOSE 127* 117*  BUN 11 12  CREATININE 0.97 1.11  CALCIUM  8.7* 9.1    CMET: Lab Results  Component Value Date   WBC 9.7 12/31/2023   HGB 11.9 (L) 12/31/2023   HCT 35.5 (L) 12/31/2023   PLT 121 (L) 12/31/2023   GLUCOSE 117 (H) 12/31/2023   CHOL 130 09/23/2022   TRIG 81.0 09/23/2022   HDL 42.40 09/23/2022   LDLCALC 72 09/23/2022   ALT 15 09/23/2022   AST 16 09/23/2022   NA 138 12/31/2023   K 4.3 12/31/2023   CL 102 12/31/2023   CREATININE 1.11 12/31/2023   BUN 12 12/31/2023   CO2 25 12/31/2023   TSH 2.615 01/03/2021   PSA 0.13 09/23/2022   INR 1.4 (H) 12/28/2023   HGBA1C 5.4 12/24/2023      PT/INR:  No results for input(s): "LABPROT", "INR" in the last 72 hours.  Radiology: No results found.    Assessment/Plan: S/P Procedure(s) (LRB): CORONARY ARTERY BYPASS GRAFTING X THREE, USING LEFT INTERNAL MAMMARY ARTERY, RIGHT ENDOSCOPIC  HARVESTED GREATER SAPHENOUS VEIN GRAFT AND LEFT RADIAL ARTERY (N/A) ECHOCARDIOGRAM, TRANSESOPHAGEAL, INTRAOPERATIVE (N/A) SURGICAL PROCUREMENT, ARTERY, RADIAL (Left)  - Postop day 4 CABG x 3. Presented with unstable angina and preserved biventricular function.  Stable vital signs andcardiac rhythm.  On aspirin , low-dose beta-blocker, and statin. Amlodipine  to optimize radial graft patency. Independent with mobility.   - Heme: Mild expected acute blood loss anemia and thrombocytopenia. Hct and Plt count stable.  - Endo: No history of diabetes.    - Pulm: Oxygenating well on room air.  - GI: abdomen benign.  Diet as desired.  -RENAL: normal function,  Wt below pre-op.  Will not continue Lasix  at discharge  -DVT PPX: SCD's, enoxaparin , advance activity as tolerated.   -Dispo: Doing well, discharge to home today.   Mickie Badders G. Kahlan Engebretson, PA-C 01/01/2024 8:20 AM

## 2024-01-01 NOTE — Plan of Care (Signed)
  Problem: Activity: Goal: Ability to return to baseline activity level will improve Outcome: Progressing   

## 2024-01-01 NOTE — Progress Notes (Signed)
 Patient verbalized undrestanding of dc instructions. Ccmd notified of dc. Piv dcd site unremarkable. All belongings given to patient. Dc lounge staff to transfer patient and pick up toc meds.

## 2024-01-01 NOTE — Discharge Instructions (Signed)

## 2024-01-03 ENCOUNTER — Encounter: Payer: Self-pay | Admitting: Cardiovascular Disease

## 2024-01-03 MED FILL — Lidocaine HCl Local Preservative Free (PF) Inj 2%: INTRAMUSCULAR | Qty: 14 | Status: AC

## 2024-01-03 MED FILL — Heparin Sodium (Porcine) Inj 1000 Unit/ML: Qty: 1000 | Status: AC

## 2024-01-03 MED FILL — Potassium Chloride Inj 2 mEq/ML: INTRAVENOUS | Qty: 40 | Status: AC

## 2024-01-06 ENCOUNTER — Encounter: Payer: Self-pay | Admitting: Cardiovascular Disease

## 2024-01-06 ENCOUNTER — Ambulatory Visit
Attending: Thoracic Surgery (Cardiothoracic Vascular Surgery) | Admitting: Thoracic Surgery (Cardiothoracic Vascular Surgery)

## 2024-01-06 ENCOUNTER — Telehealth (HOSPITAL_COMMUNITY): Payer: Self-pay

## 2024-01-06 DIAGNOSIS — Z951 Presence of aortocoronary bypass graft: Secondary | ICD-10-CM

## 2024-01-06 NOTE — Telephone Encounter (Signed)
 Pt insurance is active and benefits verified through [facility]. Co-pay $0.00, DED $6,000.00/$5,990.08 met, out of pocket $7,500.00/$7,500.00 met, co-insurance 30%. No pre-authorization required. Passport, 01/06/24 @ 3:34PM, REF#[id number]   How many CR sessions are covered? (36 visits for TCR, 72 visits for ICR)72 Is this a lifetime maximum or an annual maximum? Annual Has the member used any of these services to date? No Is there a time limit (weeks/months) on start of program and/or program completion? No     Will contact patient to see if he is interested in the Cardiac Rehab Program. If interested, patient will need to complete follow up appt. Once completed, patient will be contacted for scheduling upon review by the RN Navigator.

## 2024-01-06 NOTE — Progress Notes (Signed)
     [address]       [place] [address]             [phone]       Patient: Home Provider: Office Consent for Telemedicine visit obtained.  Today's visit was completed via a real-time telehealth (see specific modality noted below). The patient/authorized person provided oral consent at the time of the visit to engage in a telemedicine encounter with the present provider at [facility]. The patient/authorized person was informed of the potential benefits, limitations, and risks of telemedicine. The patient/authorized person expressed understanding that the laws that protect confidentiality also apply to telemedicine. The patient/authorized person acknowledged understanding that telemedicine does not provide emergency services and that he or she would need to call 911 or proceed to the nearest hospital for help if such a need arose.   Total time spent in the clinical discussion 10 minutes.  Telehealth Modality: Phone visit (audio only)  I had a telephone visit with  Francisco White who is s/p CABG.  Overall doing well.  Pain is minimal.  Ambulating well. Vitals have been stable.  Francisco White will see us  back in 1 month with a chest x-ray for cardiac rehab clearance.  [name]

## 2024-01-06 NOTE — Telephone Encounter (Signed)
 Called patient to see if he is interested in the Cardiac Rehab Program. Patient expressed interest. Explained scheduling process and went over insurance, patient verbalized understanding. Will contact patient for scheduling once f/u has been completed.

## 2024-01-13 LAB — GLUCOSE, CAPILLARY
Glucose-Capillary: 119 mg/dL — ABNORMAL HIGH (ref 70–99)
Glucose-Capillary: 136 mg/dL — ABNORMAL HIGH (ref 70–99)

## 2024-01-13 NOTE — Progress Notes (Signed)
 Cardiology Office Note    Date:  01/14/2024  ID:  Francisco White, DOB 1971-04-05, MRN [id number] PCP:  [place], Betty G, MD  Cardiologist:  Francisco Balding, MD  Electrophysiologist:  None   Chief Complaint: Follow up for CAD   History of Present Illness: .    Francisco White is a 53 y.o. male with visit-pertinent history of CAD (xience 3.5x48mm stent to the LAD in 10/2018), s/p CABGx3 with LIMA to LAD, SVG to PDA and radial artery to OM on 12/28/23, hypertension, hyperlipidemia.  Patient was previously followed by Dr. Balding remotely, then followed by Dr. Vina White from 2021 through 2023.  Patient was last seen in clinic on 12/22/2023 by Dr. Barbaraann for an acute visit regarding chest discomfort and shortness of breath.  Patient reported that since April 2025 he had been experiencing ongoing symptoms of chest discomfort described as tightness and burning accompanied by shortness of breath and feeling of indigestion.  In mid May 2025 he was evaluated at [facility], a coronary CTA was performed at that time.  He recalled being informed of concerns regarding his right coronary artery and possible multivessel disease although a cardiac catheterization was not conducted at that time.  At visit patient reported his chest symptoms had been occurring daily and had progressively worsened since April. On 05/30 he underwent outpatient cardiac catheterization which showed 50-80% stenosis of the LAD, 80% stenosis of the circumflex, and 70-99% stenosis of the RCA. Due to unstable angina he was admitted to the hospital and started on a heparin  drip with expedited cardiothoracic surgical evaluation.  Echo on 12/24/2023 indicated LVEF of 60 to 65%, no RWMA, diastolic parameters were normal, RV systolic function and size was normal, no significant valvular abnormalities.  On 12/28/2023 he underwent CABG x 3 with LIMA to LAD, SVG to PDA and radial artery to OM.  He underwent endoscopic harvest of  greater saphenous vein from his right thigh and open left radial artery harvest.  By third postoperative day he was completely independent with mobility and transfers.  Patient had an uncomplicated postoperative course and was ready for discharge on 01/01/2024.  Patient was seen by Francisco White on 01/06/2024 [facility] telephone visit.  Patient reported he been doing well, pain was minimal and vitals have been stable.  Today he presents for follow-up.  He reports that he is doing well overall.  He denies any significant chest pain, shortness of breath, lower extremity edema, orthopnea or PND.  He denies any presyncope syncope.  Patient does report a 30-minute episode yesterday in which it felt as though his heart was fluttering, described as a loose sensation in his chest as well as a feeling of an increased heart rate.  Patient denies any associated symptoms.  Patient reports that he is tolerating all of his medications well.  He denies any other cardiac concerns or complaints today.  ROS: .   Today he denies chest pain, shortness of breath, lower extremity edema, fatigue, melena, hematuria, hemoptysis, diaphoresis, weakness, presyncope, syncope, orthopnea, and PND.  All other systems are reviewed and otherwise negative. Studies Reviewed: Francisco White   EKG:  EKG is ordered today, personally reviewed, demonstrating  EKG Interpretation Date/Time:  Friday January 14 2024 10:48:48 EDT Ventricular Rate:  61 PR Interval:  152 QRS Duration:  102 QT Interval:  410 QTC Calculation: 412 R Axis:   86  Text Interpretation: Normal sinus rhythm Normal ECG When compared with ECG of 29-Dec-2023 06:51, ST no  longer elevated in Anterolateral leads Confirmed by Francisco White [phone]) on 01/14/2024 7:28:26 PM   CV Studies: Cardiac studies reviewed are outlined and summarized above. Otherwise please see EMR for full report. Cardiac Studies & Procedures    ______________________________________________________________________________________________ CARDIAC CATHETERIZATION  CARDIAC CATHETERIZATION 12/24/2023  Conclusion   Dist LM to Ost LAD lesion is 50% stenosed.   Prox LAD to Mid LAD lesion is 75% stenosed.   Mid LAD lesion is 80% stenosed.   Ost Cx to Prox Cx lesion is 80% stenosed.   Prox RCA lesion is 99% stenosed.   Mid RCA to Dist RCA lesion is 70% stenosed.   1st Mrg lesion is 25% stenosed.  1.  Proximal LAD stent with high-grade in-stent restenosis. 2.  Severe multivessel disease. 3.  LVEDP of 22 mmHg.  Recommendation: Given extremely high-grade right coronary artery lesion in conjunction with chest pain symptoms at rest last night, will admit to the hospital, place on heparin  drip, and obtain expedited cardiothoracic surgical evaluation.  Findings Coronary Findings Diagnostic  Dominance: Right  Left Main Dist LM to Ost LAD lesion is 50% stenosed.  Left Anterior Descending Prox LAD to Mid LAD lesion is 75% stenosed. The lesion was previously treated . Mid LAD lesion is 80% stenosed.  Left Circumflex Ost Cx to Prox Cx lesion is 80% stenosed.  First Obtuse Marginal Branch 1st Mrg lesion is 25% stenosed.  Right Coronary Artery There is mild diffuse disease throughout the vessel. Prox RCA lesion is 99% stenosed. Mid RCA to Dist RCA lesion is 70% stenosed.  Right Posterior Descending Artery There is mild disease in the vessel.  Third Right Posterolateral Branch Collaterals 3rd RPL filled by collaterals from 3rd Mrg.  Intervention  No interventions have been documented.   STRESS TESTS  NM MYOCAR MULTI W/SPECT W 07/01/2011   ECHOCARDIOGRAM  ECHOCARDIOGRAM COMPLETE 12/24/2023  Narrative ECHOCARDIOGRAM REPORT    Patient Name:   Francisco White Date of Exam: 12/24/2023 Medical Rec #:  [id number]        Height:       74.0 in Accession #:    [id number]       Weight:       217.1 lb Date of Birth:   1970/10/28        BSA:          2.251 m Patient Age:    53 years         BP:           119/72 mmHg Patient Gender: M                HR:           47 bpm. Exam Location:  Inpatient  Procedure: 2D Echo, 3D Echo, Cardiac Doppler, Color Doppler, Strain Analysis and Intracardiac Opacification Agent (Both Spectral and Color Flow Doppler were utilized during procedure).  Indications:    CAD native vessel  History:        Patient has prior history of Echocardiogram examinations, most recent 01/03/2021. CAD; Risk Factors:Hypertension and Dyslipidemia.  Sonographer:    Juanita Shaw Referring Phys: [id number] HARRELL O LIGHTFOOT  IMPRESSIONS   1. Left ventricular ejection fraction, by estimation, is 60 to 65%. Left ventricular ejection fraction by 3D volume is 66 %. The left ventricle has normal function. The left ventricle has no regional wall motion abnormalities. Left ventricular diastolic parameters were normal. The average left ventricular global longitudinal strain is -18.4 %. The global longitudinal strain is normal.  2. Right ventricular systolic function is normal. The right ventricular size is normal. 3. The mitral valve is normal in structure. No evidence of mitral valve regurgitation. No evidence of mitral stenosis. 4. The aortic valve is normal in structure. Aortic valve regurgitation is not visualized. No aortic stenosis is present. 5. The inferior vena cava is normal in size with greater than 50% respiratory variability, suggesting right atrial pressure of 3 mmHg.  FINDINGS Left Ventricle: Left ventricular ejection fraction, by estimation, is 60 to 65%. Left ventricular ejection fraction by 3D volume is 66 %. The left ventricle has normal function. The left ventricle has no regional wall motion abnormalities. The average left ventricular global longitudinal strain is -18.4 %. Strain was performed and the global longitudinal strain is normal. The left ventricular internal cavity size was  normal in size. There is no left ventricular hypertrophy. Left ventricular diastolic parameters were normal.  Right Ventricle: The right ventricular size is normal. No increase in right ventricular wall thickness. Right ventricular systolic function is normal.  Left Atrium: Left atrial size was normal in size.  Right Atrium: Right atrial size was normal in size.  Pericardium: There is no evidence of pericardial effusion.  Mitral Valve: The mitral valve is normal in structure. No evidence of mitral valve regurgitation. No evidence of mitral valve stenosis. MV peak gradient, 2.9 mmHg. The mean mitral valve gradient is 1.0 mmHg.  Tricuspid Valve: The tricuspid valve is normal in structure. Tricuspid valve regurgitation is not demonstrated. No evidence of tricuspid stenosis.  Aortic Valve: The aortic valve is normal in structure. Aortic valve regurgitation is not visualized. No aortic stenosis is present. Aortic valve mean gradient measures 4.0 mmHg. Aortic valve peak gradient measures 8.1 mmHg. Aortic valve area, by VTI measures 3.18 cm.  Pulmonic Valve: The pulmonic valve was normal in structure. Pulmonic valve regurgitation is not visualized. No evidence of pulmonic stenosis.  Aorta: The aortic root is normal in size and structure.  Venous: The inferior vena cava is normal in size with greater than 50% respiratory variability, suggesting right atrial pressure of 3 mmHg.  IAS/Shunts: No atrial level shunt detected by color flow Doppler.  Additional Comments: 3D was performed not requiring image post processing on an independent workstation and was normal.   LEFT VENTRICLE PLAX 2D LVIDd:         5.30 cm         Diastology LVIDs:         3.30 cm         LV e' medial:    9.57 cm/s LV PW:         0.80 cm         LV E/e' medial:  7.7 LV IVS:        0.80 cm         LV e' lateral:   12.80 cm/s LVOT diam:     2.00 cm         LV E/e' lateral: 5.7 LV SV:         82 LV SV Index:   36               2D Longitudinal LVOT Area:     3.14 cm        Strain 2D Strain GLS   -18.4 % Avg: LV Volumes (MOD) LV vol d, MOD    144.0 ml      3D Volume EF A2C:  LV 3D EF:    Left LV vol d, MOD    156.0 ml                   ventricul A4C:                                        ar LV vol s, MOD    58.0 ml                    ejection A2C:                                        fraction LV vol s, MOD    64.1 ml                    by 3D A4C:                                        volume is LV SV MOD A2C:   86.0 ml                    66 %. LV SV MOD A4C:   156.0 ml LV SV MOD BP:    92.8 ml 3D Volume EF: 3D EF:        66 %  RIGHT VENTRICLE             IVC RV Basal diam:  4.20 cm     IVC diam: 1.30 cm RV Mid diam:    3.30 cm RV S prime:     12.30 cm/s TAPSE (M-mode): 3.5 cm  LEFT ATRIUM             Index        RIGHT ATRIUM           Index LA diam:        3.10 cm 1.38 cm/m   RA Area:     14.10 cm LA Vol (A2C):   51.7 ml 22.97 ml/m  RA Volume:   35.10 ml  15.60 ml/m LA Vol (A4C):   37.0 ml 16.44 ml/m LA Biplane Vol: 45.0 ml 19.99 ml/m AORTIC VALVE                    PULMONIC VALVE AV Area (Vmax):    3.01 cm     PV Vmax:       1.04 m/s AV Area (Vmean):   2.88 cm     PV Peak grad:  4.3 mmHg AV Area (VTI):     3.18 cm AV Vmax:           142.00 cm/s AV Vmean:          92.600 cm/s AV VTI:            0.257 m AV Peak Grad:      8.1 mmHg AV Mean Grad:      4.0 mmHg LVOT Vmax:         136.00 cm/s LVOT Vmean:        84.900 cm/s LVOT VTI:          0.260 m LVOT/AV VTI ratio: 1.01  AORTA Ao Root  diam: 3.50 cm Ao Asc diam:  3.30 cm  MITRAL VALVE MV Area (PHT): 3.54 cm    SHUNTS MV Area VTI:   2.65 cm    Systemic VTI:  0.26 m MV Peak grad:  2.9 mmHg    Systemic Diam: 2.00 cm MV Mean grad:  1.0 mmHg MV Vmax:       0.84 m/s MV Vmean:      40.0 cm/s MV Decel Time: 214 msec MV E velocity: 73.30 cm/s MV A velocity: 61.90 cm/s MV E/A ratio:  1.18  Morene Brownie Electronically signed by Morene Brownie Signature Date/Time: 12/24/2023/5:35:17 PM    Final   TEE  ECHO INTRAOPERATIVE TEE 12/28/2023  Narrative *INTRAOPERATIVE TRANSESOPHAGEAL REPORT *    Patient Name:   Francisco White Date of Exam: 12/28/2023 Medical Rec #:  [id number]        Height:       74.0 in Accession #:    [id number]       Weight:       217.1 lb Date of Birth:  04/30/1971        BSA:          2.25 m Patient Age:    53 years         BP:           114/84 mmHg Patient Gender: M                HR:           38 bpm. Exam Location:  Anesthesiology  Transesophogeal exam was perform intraoperatively during surgical procedure. Patient was closely monitored under general anesthesia during the entirety of examination.  Indications:     R07.89 Other chest pain; I20.9 Angina pectoris, unspecified; CABG Performing Phys: [id number] LINNIE KIDD LIGHTFOOT Diagnosing Phys: Kelly Mace MD  Complications: No known complications during this procedure. POST-OP IMPRESSIONS The ECHO probe/machine malfunctioned. ECHO was not used post cardiopulmonary bypass.  PRE-OP FINDINGS Left Ventricle: The left ventricle has low normal systolic function, with an ejection fraction of 50-55%, measured 53%. The cavity size was normal. Left ventrical global hypokinesis without regional wall motion abnormalities. There is no left ventricular hypertrophy. Left ventricular diastolic function  was not evaluated.  Right Ventricle: The right ventricle has normal systolic function. The cavity was normal. There is no increase in right ventricular wall thickness.  Left Atrium: Left atrial size was normal in size. No left atrial/left atrial appendage thrombus was detected. Left atrial appendage velocity is normal at greater than 40 cm/s.  Right Atrium: Right atrial size was normal in size. Catheter present in the right atrium.  Interatrial Septum: No atrial level shunt detected by color flow Doppler.  There is no evidence of a patent foramen ovale.  Pericardium: There is no evidence of pericardial effusion.  Mitral Valve: The mitral valve is normal in structure. Mitral valve regurgitation is trivial by color flow Doppler. There is no evidence of mitral valve vegetation. Pulmonary venous flow is normal. There is no evidence of mitral stenosis, with mean gradient 1 mmHg, peak gradient 3 mmHg.  Tricuspid Valve: The tricuspid valve was normal in structure. Tricuspid valve regurgitation is trivial by color flow Doppler. No evidence of tricuspid stenosis is present. There is no evidence of tricuspid valve vegetation.  Aortic Valve: The aortic valve is normal in structure. Aortic valve regurgitation was not visualized by color flow Doppler. There is no stenosis of the aortic valve, with peak gradient 6 mmHg, mean  gradient 3 mmHg. There is no evidence of aortic valve vegetation.  Pulmonic Valve: The pulmonic valve was normal in structure, with normal leaflet mobility. No evidence of pulmonic stenosis. Pulmonic valve regurgitation is mild by color flow Doppler.   Aorta: The aortic root, ascending aorta and aortic arch are normal in size and structure. There is evidence of plaque in the descending aorta; Grade I, measuring 1-35mm in size.  Pulmonary Artery: The pulmonary artery is of normal size.  Venous: The inferior vena cava is normal in size with greater than 50% respiratory variability, suggesting right atrial pressure of 3 mmHg.  Shunts: There is no evidence of an atrial septal defect.  +--------------+-------++ LEFT VENTRICLE        +--------------+-------++ PLAX 2D               +--------------+-------++ LVIDd:        5.40 cm +--------------+-------++ LVIDs:        3.90 cm +--------------+-------++ LV SV:        75 ml   +--------------+-------++ LV SV Index:  33.03   +--------------+-------++                        +--------------+-------++  +-------------+-----------++ AORTIC VALVE             +-------------+-----------++ AV Vmax:     125.00 cm/s +-------------+-----------++ AV Vmean:    83.400 cm/s +-------------+-----------++ AV VTI:      0.293 m     +-------------+-----------++ AV Peak Grad:6.2 mmHg    +-------------+-----------++ AV Mean Grad:3.0 mmHg    +-------------+-----------++  +-------------+---------++ MITRAL VALVE           +-------------+---------++ MV Peak grad:2.8 mmHg  +-------------+---------++ MV Mean grad:1.0 mmHg  +-------------+---------++ MV Vmax:     0.84 m/s  +-------------+---------++ MV Vmean:    38.8 cm/s +-------------+---------++ MV VTI:      0.27 m    +-------------+---------++   Kelly Mace MD Electronically signed by Kelly Mace MD Signature Date/Time: 12/28/2023/2:54:51 PM    Final  MONITORS  CARDIAC EVENT MONITOR 02/11/2021  Narrative Sinus rhythm with occasional PACs, PVCs and short (limted ) PAT No significant arrhythmias Triggered events (palpitations) correlated with SR or SR with PACs or short burst PAT       ______________________________________________________________________________________________       Current Reported Medications:.    Current Meds  Medication Sig   acetaminophen  (TYLENOL ) 325 MG tablet Take 2 tablets (650 mg total) by mouth every 6 (six) hours.   aspirin  EC 325 MG tablet Take 1 tablet (325 mg total) by mouth daily.   Multiple Vitamins-Minerals (EQ MULTIVITAMINS ADULT GUMMY PO) Take 1 each by mouth daily.   nitroGLYCERIN  (NITROSTAT ) 0.4 MG SL tablet Place 1 tablet (0.4 mg total) under the tongue every 5 (five) minutes as needed for chest pain. Max 3 tablets per event.   sertraline  (ZOLOFT ) 100 MG tablet Take 1 tablet (100 mg total) by mouth daily.   [DISCONTINUED] amLODipine  (NORVASC ) 5 MG tablet Take 1 tablet (5 mg total) by mouth daily.    [DISCONTINUED] hydrochlorothiazide  (HYDRODIURIL ) 12.5 MG tablet Take 1 tablet (12.5 mg total) by mouth daily.   [DISCONTINUED] metoprolol  succinate (TOPROL  XL) 25 MG 24 hr tablet Take 1 tablet (25 mg total) by mouth daily.   [DISCONTINUED] rosuvastatin  (CRESTOR ) 40 MG tablet Take 1 tablet (40 mg total) by mouth daily.    Physical Exam:    VS:  BP 122/70 (BP Location: Right Arm,  Patient Position: Sitting)   Pulse (!) 59   Resp 16   Ht 6' 2 (1.88 m)   Wt 220 lb 9.6 oz (100.1 kg)   SpO2 95%   BMI 28.32 kg/m    Wt Readings from Last 3 Encounters:  01/14/24 220 lb 9.6 oz (100.1 kg)  01/01/24 214 lb 3.2 oz (97.2 kg)  12/22/23 224 lb 8 oz (101.8 kg)    GEN: Well nourished, well developed in no acute distress NECK: No JVD; No carotid bruits CARDIAC: RRR, no murmurs, rubs, gallops RESPIRATORY:  Clear to auscultation without rales, wheezing or rhonchi  ABDOMEN: Soft, non-tender, non-distended EXTREMITIES:  No edema; No acute deformity     Asessement and Plan:.    CAD: s/p xience 3.5x40mm stent to the LAD in 10/2018, s/p CABGx3 with LIMA to LAD, SVG to PDA and radial artery to OM on 12/28/23. Today he reports that he is doing well, denies significant chest pain or shortness of breath. He does not one episode of palpitations, see below.  Surgical sites are clean and intact.  Will defer approval to start cardiac rehab to [facility].  Reviewed ED precautions.  Continue aspirin  325 mg daily, amlodipine  5 mg daily, hydrochlorothiazide  12.5 mg daily, Crestor  40 mg daily and metoprolol  succinate 25 mg daily. Check CBC and CMET.   Palpitations: Patient reports an episode of palpitations yesterday that lasted for 30 minutes.  He described as a fluttering sensation in his chest, denied any associated symptoms.  EKG today indicates normal sinus rhythm.  Patient is agreeable to wearing a live 2-week cardiac monitor.  Check CBC, c-Met and Magnesium  level.   HTN: Blood pressure today 122/70.  Continue current  antihypertensive regimen.  HLD: Last lipid profile on 09/23/2022 indicated total cholesterol 130, HDL 42, triglycerides 81 and LDL 72.  Lipoprotein a 125.6 on 12/25/2023.  Patient does note increased right shoulder pain that he has previously correlated with statins, notes when he exercises this improved however given recent sternotomy he is unable to exercise as he typically would and has had recurrent right shoulder pain.  Patient will take a 2 to 3-week statin vacation and notify the office if any improvement. Check fasting lipid profile and LFTs.  Disposition: F/u with Dr. Francyne or Shantale Holtmeyer, NP in 2 months or sooner if needed.   Signed, Matthias Bogus D Ameet Sandy, NP

## 2024-01-14 ENCOUNTER — Encounter: Payer: Self-pay | Admitting: Cardiology

## 2024-01-14 ENCOUNTER — Other Ambulatory Visit

## 2024-01-14 ENCOUNTER — Other Ambulatory Visit: Payer: Self-pay | Admitting: Cardiology

## 2024-01-14 ENCOUNTER — Other Ambulatory Visit (HOSPITAL_COMMUNITY): Payer: Self-pay

## 2024-01-14 ENCOUNTER — Ambulatory Visit: Attending: Cardiology | Admitting: Cardiology

## 2024-01-14 VITALS — BP 122/70 | HR 59 | Resp 16 | Ht 74.0 in | Wt 220.6 lb

## 2024-01-14 DIAGNOSIS — I10 Essential (primary) hypertension: Secondary | ICD-10-CM

## 2024-01-14 DIAGNOSIS — Z951 Presence of aortocoronary bypass graft: Secondary | ICD-10-CM | POA: Diagnosis not present

## 2024-01-14 DIAGNOSIS — I2510 Atherosclerotic heart disease of native coronary artery without angina pectoris: Secondary | ICD-10-CM | POA: Diagnosis not present

## 2024-01-14 DIAGNOSIS — E782 Mixed hyperlipidemia: Secondary | ICD-10-CM

## 2024-01-14 DIAGNOSIS — R002 Palpitations: Secondary | ICD-10-CM

## 2024-01-14 MED ORDER — ROSUVASTATIN CALCIUM 40 MG PO TABS
40.0000 mg | ORAL_TABLET | Freq: Every day | ORAL | 0 refills | Status: DC
  Filled 2024-01-14: qty 30, 30d supply, fill #0

## 2024-01-14 MED ORDER — AMLODIPINE BESYLATE 5 MG PO TABS
5.0000 mg | ORAL_TABLET | Freq: Every day | ORAL | 2 refills | Status: DC
  Filled 2024-01-14 – 2024-01-31 (×3): qty 30, 30d supply, fill #0

## 2024-01-14 MED ORDER — METOPROLOL SUCCINATE ER 25 MG PO TB24
25.0000 mg | ORAL_TABLET | Freq: Every day | ORAL | 11 refills | Status: DC
  Filled 2024-01-14 – 2024-01-31 (×3): qty 30, 30d supply, fill #0

## 2024-01-14 MED ORDER — HYDROCHLOROTHIAZIDE 12.5 MG PO TABS
12.5000 mg | ORAL_TABLET | Freq: Every day | ORAL | 3 refills | Status: AC
  Filled 2024-01-14: qty 30, 30d supply, fill #0
  Filled 2024-03-28 – 2024-06-19 (×3): qty 30, 30d supply, fill #1
  Filled 2024-07-27: qty 30, 30d supply, fill #2
  Filled 2024-08-23: qty 30, 30d supply, fill #3

## 2024-01-14 NOTE — Patient Instructions (Signed)
 Medication Instructions:  No changes *If you need a refill on your cardiac medications before your next appointment, please call your pharmacy*  Lab Work: Today we are going to draw a CBC, Cmet, and Mag Next week we are going to draw a fasting lipid panel If you have labs (blood work) drawn today and your tests are completely normal, you will receive your results only by: MyChart Message (if you have MyChart) OR A paper copy in the mail If you have any lab test that is abnormal or we need to change your treatment, we will call you to review the results.  Testing/Procedures: Look Below  Follow-Up: At [facility], you and your health needs are our priority.  As part of our continuing mission to provide you with exceptional heart care, our providers are all part of one team.  This team includes your primary Cardiologist (physician) and Advanced Practice Providers or APPs (Physician Assistants and Nurse Practitioners) who all work together to provide you with the care you need, when you need it.  Your next appointment:   2 month(s)  Provider:   Luana Rumple, MD or Katlyn West, NP   We recommend signing up for the patient portal called MyChart.  Sign up information is provided on this After Visit Summary.  MyChart is used to connect with patients for Virtual Visits (Telemedicine).  Patients are able to view lab/test results, encounter notes, upcoming appointments, etc.  Non-urgent messages can be sent to your provider as well.   To learn more about what you can do with MyChart, go to [email or web address].   Other Instructions ZIO AT Long term monitor-Live Telemetry  Your physician has requested you wear a ZIO patch monitor for 14 days.  This is a single patch monitor. [employer] supplies one patch monitor per enrollment. Additional  stickers are not available.  Please do not apply patch if you will be having a Nuclear Stress Test, Echocardiogram, Cardiac CT, MRI,  or Chest  Xray during the period you would be wearing the monitor. The patch cannot be worn during  these tests. You cannot remove and re-apply the ZIO AT patch monitor.  Your ZIO patch monitor will be mailed 3 day USPS to your address on file. It may take 3-5 days to  receive your monitor after you have been enrolled.  Once you have received your monitor, please review the enclosed instructions. Your monitor has  already been registered assigning a specific monitor serial # to you.   Billing and Patient Assistance Program information  Sanna Crystal has been supplied with any insurance information on record for billing. [employer] offers a sliding scale Patient Assistance Program for patients without insurance, or whose  insurance does not completely cover the cost of the ZIO patch monitor. You must apply for the  Patient Assistance Program to qualify for the discounted rate. To apply, call [employer] at [phone],  select option 4, select option 2 , ask to apply for the Patient Assistance Program, (you can request an  interpreter if needed). [employer] will ask your household income and how many people are in your  household. [employer] will quote your out-of-pocket cost based on this information. They will also be able  to set up a 12 month interest free payment plan if needed.  Applying the monitor   Shave hair from upper left chest.  Hold the abrader disc by orange tab. Rub the abrader in 40 strokes over left upper chest as indicated in  your monitor instructions.  Clean area with 4 enclosed alcohol pads. Use all pads to ensure the area is cleaned thoroughly. Let  dry.  Apply patch as indicated in monitor instructions. Patch will be placed under collarbone on left side of  chest with arrow pointing upward.  Rub patch adhesive wings for 2 minutes. Remove the white label marked 1. Remove the white label  marked 2. Rub patch adhesive wings for 2 additional minutes.  While looking in a mirror, press and  release button in center of patch. A small green light will flash 3-4  times. This will be your only indicator that the monitor has been turned on.  Do not shower for the first 24 hours. You may shower after the first 24 hours.  Press the button if you feel a symptom. You will hear a small click. Record Date, Time and Symptom in  the Patient Log.   Starting the Gateway  In your kit there is a Audiological scientist box the size of a cellphone. This is Buyer, retail. It transmits all your  recorded data to [facility]. This box must always stay within 10 feet of you. Open the box and push the *  button. There will be a light that blinks orange and then green a few times. When the light stops  blinking, the Gateway is connected to the ZIO patch. Call [employer] at [phone] to confirm your monitor is transmitting.  Returning your monitor  Remove your patch and place it inside the Gateway. In the lower half of the Gateway there is a white  bag with prepaid postage on it. Place Gateway in bag and seal. Mail package back to [place] as soon as  possible. Your physician should have your final report approximately 7 days after you have mailed back  your monitor. Call [facility] Customer Care at [phone] if you have questions regarding your ZIO AT  patch monitor. Call them immediately if you see an orange light blinking on your monitor.  If your monitor falls off in less than 4 days, contact our Monitor department at [phone]. If your  monitor becomes loose or falls off after 4 days call [employer] at [phone] for suggestions on  securing your monitor

## 2024-01-14 NOTE — Progress Notes (Unsigned)
 Enrolled patient for a 14 day Zio AT monitor to be mailed to patients home  [place] to read

## 2024-01-15 ENCOUNTER — Encounter: Payer: Self-pay | Admitting: Cardiology

## 2024-01-15 ENCOUNTER — Ambulatory Visit: Payer: Self-pay | Admitting: Cardiology

## 2024-01-15 DIAGNOSIS — E782 Mixed hyperlipidemia: Secondary | ICD-10-CM

## 2024-01-15 LAB — COMPREHENSIVE METABOLIC PANEL WITH GFR
ALT: 19 IU/L (ref 0–44)
AST: 16 IU/L (ref 0–40)
Albumin: 4.4 g/dL (ref 3.8–4.9)
Alkaline Phosphatase: 73 IU/L (ref 44–121)
BUN/Creatinine Ratio: 13 (ref 9–20)
BUN: 14 mg/dL (ref 6–24)
Bilirubin Total: 0.5 mg/dL (ref 0.0–1.2)
CO2: 23 mmol/L (ref 20–29)
Calcium: 9.1 mg/dL (ref 8.7–10.2)
Chloride: 102 mmol/L (ref 96–106)
Creatinine, Ser: 1.08 mg/dL (ref 0.76–1.27)
Globulin, Total: 2 g/dL (ref 1.5–4.5)
Glucose: 81 mg/dL (ref 70–99)
Potassium: 4.1 mmol/L (ref 3.5–5.2)
Sodium: 140 mmol/L (ref 134–144)
Total Protein: 6.4 g/dL (ref 6.0–8.5)
eGFR: 81 mL/min/{1.73_m2} (ref >59)

## 2024-01-15 LAB — CBC
Hematocrit: 39.3 % (ref 37.5–51.0)
Hemoglobin: 12.9 g/dL — ABNORMAL LOW (ref 13.0–17.7)
MCH: 30.1 pg (ref 26.6–33.0)
MCHC: 32.8 g/dL (ref 31.5–35.7)
MCV: 92 fL (ref 79–97)
Platelets: 318 10*3/uL (ref 150–450)
RBC: 4.29 x10E6/uL (ref 4.14–5.80)
RDW: 12.5 % (ref 11.6–15.4)
WBC: 10.7 10*3/uL (ref 3.4–10.8)

## 2024-01-15 LAB — MAGNESIUM: Magnesium: 2.2 mg/dL (ref 1.6–2.3)

## 2024-01-17 NOTE — Telephone Encounter (Signed)
 Left message to call back Sending MyChart message

## 2024-01-17 NOTE — Telephone Encounter (Signed)
-----   Message from Rosabel BIRCH [place] sent at 01/15/2024  2:20 PM EDT ----- Please let Mr. Talton know that his hemoglobin and hematocrit are improving. His kidney function and electrolytes are normal. His liver function is normal. Good results! Continue current medications  and follow up as planned.  ----- Message ----- From: Rebecka Memos Lab Results In Sent: 01/15/2024   3:36 AM EDT To: Katlyn D West, NP

## 2024-01-25 ENCOUNTER — Other Ambulatory Visit: Payer: Self-pay | Admitting: Cardiovascular Disease

## 2024-01-31 ENCOUNTER — Other Ambulatory Visit (HOSPITAL_COMMUNITY): Payer: Self-pay

## 2024-01-31 LAB — LIPID PANEL

## 2024-02-01 LAB — LIPID PANEL
Cholesterol, Total: 175 mg/dL (ref 100–199)
HDL: 46 mg/dL (ref >39)
LDL CALC COMMENT:: 3.8 ratio (ref 0.0–5.0)
LDL Chol Calc (NIH): 105 mg/dL — AB (ref 0–99)
Triglycerides: 132 mg/dL (ref 0–149)
VLDL Cholesterol Cal: 24 mg/dL (ref 5–40)

## 2024-02-02 ENCOUNTER — Other Ambulatory Visit: Payer: Self-pay | Admitting: Family Medicine

## 2024-02-02 DIAGNOSIS — F325 Major depressive disorder, single episode, in full remission: Secondary | ICD-10-CM

## 2024-02-03 NOTE — Progress Notes (Signed)
 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 Shyrl on  12/28/23.  The patient's early postoperative recovery while in the hospital was uneventful and he was discharged on post-op day 4. Since then he has continued to have a progressive recovery.  He as contacted in a virtual visit by Dr. Shyrl on 6/12 and has been seen by Kaitlyn West, NP in the cardiology clinic on 01/14/24 where he reported having an episode of palpitations. A 14-day cardiac event monitor was ordered and results are pending.  He has not been troubled by ongoing palpitations.  Since hospital discharge the patient reports continued progress.  He is anxious to increase his activity level.  He is planning overseas travel in September.   Current Outpatient Medications  Medication Sig Dispense Refill   acetaminophen  (TYLENOL ) 325 MG tablet Take 2 tablets (650 mg total) by mouth every 6 (six) hours.     amLODipine  (NORVASC ) 5 MG tablet Take 1 tablet (5 mg total) by mouth daily. 30 tablet 2   aspirin  EC 325 MG tablet Take 1 tablet (325 mg total) by mouth daily. 100 tablet 3   hydrochlorothiazide  (HYDRODIURIL ) 12.5 MG tablet Take 1 tablet (12.5 mg total) by mouth daily. 90 tablet 3   metoprolol  succinate (TOPROL  XL) 25 MG 24 hr tablet Take 1 tablet (25 mg total) by mouth daily. 30 tablet 11   Multiple Vitamins-Minerals (EQ MULTIVITAMINS ADULT GUMMY PO) Take 1 each by mouth daily.     nitroGLYCERIN  (NITROSTAT ) 0.4 MG SL tablet Place 1 tablet (0.4 mg total) under the tongue every 5 (five) minutes as needed for chest pain. Max 3 tablets per event. 90 tablet 3   rosuvastatin  (CRESTOR ) 40 MG tablet Take 1 tablet by mouth daily. Please call office [phone] to schedule an overdue appointment with Dr. Francyne before anymore refills. 30 tablet 0   sertraline   (ZOLOFT ) 100 MG tablet Take 1 tablet (100 mg total) by mouth daily. Due for physical 90 tablet 0   No current facility-administered medications for this visit.    Physical Exam Vital signs BP 114/72 Pulse 60 Respirations 20 SpO2 98% on room air General: Very pleasant 53 year old male in no distress. Heart: Regular rate and rhythm, no murmur. Chest: Breath sounds are full, equal, and clear to auscultation.  Sternotomy incision is intact and healing with no sign of complication.  Sternum is stable. Extremities: The left arm incision is healing well.  He does have a very small area of numbness at the distal end of the incision near the wrist.  This is not troublesome.  There is no peripheral edema.  Right lower extremity EVH incisions are also intact and healing with no sign of complication.    Diagnostic Tests:  CLINICAL DATA:  53 year old male status post CABG.   EXAM: CHEST - 2 VIEW   COMPARISON:  Chest radiographs 01/01/2024 and earlier.   FINDINGS: Two views 0814 hours. Sequelae of sternotomy. Cardiac and mediastinal contours are within normal limits. Visualized tracheal air column is within normal limits. Larger lung volumes. Regressed small pleural effusions, residual small volume left costophrenic angle pleural fluid now. No pneumothorax, pulmonary edema, confluent lung opacity. No acute osseous abnormality identified. Negative visible bowel gas.  IMPRESSION: 1. Regressed small pleural effusions, residual small volume left costophrenic angle fluid now. 2. No other acute cardiopulmonary abnormality.     Electronically Signed   By: Francisco White M.D.   On: 02/08/2024 08:25   Impression / Plan: Francisco White is making a progressive and satisfactory recovery after CABG x 3.  Medications are reviewed and no changes are indicated from a surgical standpoint.  He continues on the amlodipine  that we started for his radial graft for blood pressure management.  We reviewed sternal  precautions.  He may resume driving at this point.  He may gradually increase his activity without limitation in another 6 weeks.  I see no reason why cannot keep his plans to travel outside the country in September.   Francisco Thielman G. Lavone Barrientes, PA-C [facility] [phone]

## 2024-02-03 NOTE — Patient Instructions (Signed)
 You may resume driving.  You may gradually increase your activity but continue to observe sternal precautions (no lifting, pushing, or pulling greater than 15lbs) for another 6 weeks. After that, you may advance activity without limitations.   No change in medications from CT surgery standpoint.  Will make a referral for cardiac rehab.  Continue follow up with cardiology as scheduled. Follow up with us  as needed.

## 2024-02-03 NOTE — Telephone Encounter (Signed)
 Referral to Lipid Clinic ordered.

## 2024-02-04 ENCOUNTER — Other Ambulatory Visit: Payer: Self-pay | Admitting: Thoracic Surgery (Cardiothoracic Vascular Surgery)

## 2024-02-04 DIAGNOSIS — Z951 Presence of aortocoronary bypass graft: Secondary | ICD-10-CM

## 2024-02-08 ENCOUNTER — Ambulatory Visit: Attending: Thoracic Surgery (Cardiothoracic Vascular Surgery) | Admitting: Physician Assistant

## 2024-02-08 ENCOUNTER — Ambulatory Visit (HOSPITAL_COMMUNITY)
Admission: RE | Admit: 2024-02-08 | Discharge: 2024-02-08 | Disposition: A | Discharge status: Home / Self Care | Source: Ambulatory Visit | Attending: Internal Medicine | Admitting: Internal Medicine

## 2024-02-08 ENCOUNTER — Encounter: Payer: Self-pay | Admitting: Physician Assistant

## 2024-02-08 ENCOUNTER — Other Ambulatory Visit: Payer: Self-pay | Admitting: *Deleted

## 2024-02-08 VITALS — BP 114/72 | HR 60 | Resp 20 | Ht 74.0 in | Wt 220.0 lb

## 2024-02-08 DIAGNOSIS — Z951 Presence of aortocoronary bypass graft: Secondary | ICD-10-CM | POA: Diagnosis present

## 2024-02-08 NOTE — Progress Notes (Signed)
 Ambulatory referral placed for [facility] Cardiac Rehab program per EMERSON Becket, PA and Dr. Shyrl.

## 2024-02-10 ENCOUNTER — Other Ambulatory Visit: Payer: Self-pay

## 2024-02-10 ENCOUNTER — Emergency Department (HOSPITAL_BASED_OUTPATIENT_CLINIC_OR_DEPARTMENT_OTHER)

## 2024-02-10 ENCOUNTER — Encounter (HOSPITAL_BASED_OUTPATIENT_CLINIC_OR_DEPARTMENT_OTHER): Payer: Self-pay | Admitting: *Deleted

## 2024-02-10 ENCOUNTER — Emergency Department (HOSPITAL_BASED_OUTPATIENT_CLINIC_OR_DEPARTMENT_OTHER)
Admission: EM | Admit: 2024-02-10 | Discharge: 2024-02-11 | Disposition: A | Discharge status: Home / Self Care | Attending: Emergency Medicine | Admitting: Emergency Medicine

## 2024-02-10 DIAGNOSIS — Z951 Presence of aortocoronary bypass graft: Secondary | ICD-10-CM | POA: Diagnosis not present

## 2024-02-10 DIAGNOSIS — M7989 Other specified soft tissue disorders: Secondary | ICD-10-CM

## 2024-02-10 DIAGNOSIS — M79662 Pain in left lower leg: Secondary | ICD-10-CM

## 2024-02-10 DIAGNOSIS — Z7982 Long term (current) use of aspirin: Secondary | ICD-10-CM | POA: Insufficient documentation

## 2024-02-10 DIAGNOSIS — I82412 Acute embolism and thrombosis of left femoral vein: Secondary | ICD-10-CM | POA: Diagnosis not present

## 2024-02-10 DIAGNOSIS — M79605 Pain in left leg: Secondary | ICD-10-CM | POA: Diagnosis present

## 2024-02-10 DIAGNOSIS — Z7901 Long term (current) use of anticoagulants: Secondary | ICD-10-CM | POA: Insufficient documentation

## 2024-02-10 LAB — CBC WITH DIFFERENTIAL/PLATELET
Abs Immature Granulocytes: 0.03 K/uL (ref 0.00–0.07)
Basophils Absolute: 0.1 K/uL (ref 0.0–0.1)
Basophils Relative: 1 %
Eosinophils Absolute: 0.4 K/uL (ref 0.0–0.5)
Eosinophils Relative: 3 %
HCT: 38 % — ABNORMAL LOW (ref 39.0–52.0)
Hemoglobin: 12.7 g/dL — ABNORMAL LOW (ref 13.0–17.0)
Immature Granulocytes: 0 %
Lymphocytes Relative: 17 %
Lymphs Abs: 1.8 K/uL (ref 0.7–4.0)
MCH: 29.3 pg (ref 26.0–34.0)
MCHC: 33.4 g/dL (ref 30.0–36.0)
MCV: 87.8 fL (ref 80.0–100.0)
Monocytes Absolute: 1.1 K/uL — ABNORMAL HIGH (ref 0.1–1.0)
Monocytes Relative: 10 %
Neutro Abs: 7.5 K/uL (ref 1.7–7.7)
Neutrophils Relative %: 69 %
Platelets: 155 K/uL (ref 150–400)
RBC: 4.33 MIL/uL (ref 4.22–5.81)
RDW: 12.2 % (ref 11.5–15.5)
WBC: 10.9 K/uL — ABNORMAL HIGH (ref 4.0–10.5)
nRBC: 0 % (ref 0.0–0.2)

## 2024-02-10 LAB — BASIC METABOLIC PANEL WITH GFR
Anion gap: 13 (ref 5–15)
BUN: 11 mg/dL (ref 6–20)
CO2: 22 mmol/L (ref 22–32)
Calcium: 9.5 mg/dL (ref 8.9–10.3)
Chloride: 103 mmol/L (ref 98–111)
Creatinine, Ser: 1.03 mg/dL (ref 0.61–1.24)
GFR, Estimated: >60 mL/min (ref >60)
Glucose, Bld: 142 mg/dL — ABNORMAL HIGH (ref 70–99)
Potassium: 3.6 mmol/L (ref 3.5–5.1)
Sodium: 138 mmol/L (ref 135–145)

## 2024-02-10 MED ORDER — APIXABAN 2.5 MG PO TABS
10.0000 mg | ORAL_TABLET | Freq: Two times a day (BID) | ORAL | Status: DC
  Administered 2024-02-10: 10 mg via ORAL
  Filled 2024-02-10: qty 4

## 2024-02-10 MED ORDER — ELIQUIS 5 MG PO TABS
ORAL_TABLET | ORAL | 0 refills | Status: DC
Start: 2024-02-10 — End: 2024-02-25
  Filled 2024-02-10: qty 60, 23d supply, fill #0

## 2024-02-10 MED ORDER — APIXABAN (ELIQUIS) EDUCATION KIT FOR DVT/PE PATIENTS: PACK | Freq: Once | Status: DC

## 2024-02-10 NOTE — ED Provider Notes (Signed)
 [place] EMERGENCY DEPARTMENT AT [facility] Provider Note   CSN: [id number] Arrival date & time: 02/10/24  1955     Patient presents with: Leg Pain   Francisco White is a 53 y.o. male.   Francisco White is a 53 y.o. male with history of recent CABG, who presents to the emergency department for evaluation of some mild pain and swelling in the left lower leg.  He reports he first noticed this about 2 days ago when he had some soreness behind the knee.  At first he thought this was just related to doing some stretching but then noticed some slight swelling in the lower leg and given his recent cardiac history felt that he should come get checked out for a blood clot.  He denies any numbness or weakness in the leg.  He has not noticed any discoloration.  No other aggravating or alleviating factors.  No chest pain or shortness of breath.   Leg Pain Associated symptoms: no fever        Prior to Admission medications   Medication Sig Start Date End Date Taking? Authorizing Provider  acetaminophen  (TYLENOL ) 325 MG tablet Take 2 tablets (650 mg total) by mouth every 6 (six) hours. 01/01/24   Roddenberry, Myron G, PA-C  amLODipine  (NORVASC ) 5 MG tablet Take 1 tablet (5 mg total) by mouth daily. 01/14/24   West, Katlyn D, NP  aspirin  EC 325 MG tablet Take 1 tablet (325 mg total) by mouth daily. 01/01/24   Roddenberry, Myron G, PA-C  hydrochlorothiazide  (HYDRODIURIL ) 12.5 MG tablet Take 1 tablet (12.5 mg total) by mouth daily. 01/14/24   West, Katlyn D, NP  metoprolol  succinate (TOPROL  XL) 25 MG 24 hr tablet Take 1 tablet (25 mg total) by mouth daily. 01/14/24   West, Katlyn D, NP  Multiple Vitamins-Minerals (EQ MULTIVITAMINS ADULT GUMMY PO) Take 1 each by mouth daily.    [provider]  nitroGLYCERIN  (NITROSTAT ) 0.4 MG SL tablet Place 1 tablet (0.4 mg total) under the tongue every 5 (five) minutes as needed for chest pain. Max 3 tablets per event. 12/22/23 03/21/24  Francisco Darryle Ned, MD  rosuvastatin  (CRESTOR ) 40 MG tablet Take 1 tablet by mouth daily. Please call office [phone] to schedule an overdue appointment with Dr. Francyne before anymore refills. 01/25/24   West, Katlyn D, NP  sertraline  (ZOLOFT ) 100 MG tablet Take 1 tablet (100 mg total) by mouth daily. Due for physical 02/02/24   [place], Betty G, MD    Allergies: Simvastatin and Olmesartan     Review of Systems  Constitutional:  Negative for chills and fever.  Respiratory:  Negative for shortness of breath.   Cardiovascular:  Positive for leg swelling. Negative for chest pain.    Updated Vital Signs BP 121/84 (BP Location: Right Arm)   Pulse 61   Temp 97.8 F (36.6 C)   Resp 18   SpO2 99%   Physical Exam Vitals and nursing note reviewed.  Constitutional:      General: He is not in acute distress.    Appearance: Normal appearance. He is well-developed. He is not ill-appearing or diaphoretic.  HENT:     Head: Normocephalic and atraumatic.  Eyes:     General:        Right eye: No discharge.        Left eye: No discharge.  Pulmonary:     Effort: Pulmonary effort is normal. No respiratory distress.  Musculoskeletal:  General: Tenderness present.     Comments: Mild tenderness behinds the left knee with trace edema in the left lower leg, no erythema, 2+ DP and TP pulses  Neurological:     Mental Status: He is alert and oriented to person, place, and time.     Coordination: Coordination normal.  Psychiatric:        Mood and Affect: Mood normal.        Behavior: Behavior normal.     (all labs ordered are listed, but only abnormal results are displayed) Labs Reviewed  BASIC METABOLIC PANEL WITH GFR - Abnormal; Notable for the following components:      Result Value   Glucose, Bld 142 (*)    All other components within normal limits  CBC WITH DIFFERENTIAL/PLATELET - Abnormal; Notable for the following components:   WBC 10.9 (*)    Hemoglobin 12.7 (*)    HCT 38.0 (*)     Monocytes Absolute 1.1 (*)    All other components within normal limits    EKG: None  Radiology: No results found.   Procedures   Medications Ordered in the ED - No data to display                                  Medical Decision Making Amount and/or Complexity of Data Reviewed Labs: ordered.   53 year old male presents with a few days of mild left lower leg pain and swelling, history of recent cardiac surgery, came in for DVT evaluation.  No chest pain or shortness of breath, vitals are normal and patient is well-appearing.  The left lower leg is neurovascularly intact, warm and well-perfused.  Trace edema noted.  Basic labs reassuring with normal renal function should the patient need anticoagulation.  DVT ultrasound pending at shift change, care signed out to PA [facility] who will follow-up on ultrasound if normal discharge home with close PCP follow-up if positive would start patient on Eliquis .     Final diagnoses:  Pain and swelling of left lower leg    ED Discharge Orders     None          Francisco White 02/10/24 2149    Francisco Hollering, MD 02/10/24 2326

## 2024-02-10 NOTE — ED Provider Notes (Cosign Needed Addendum)
  Accepted handoff at shift change from Kelsey PA-C. Please see prior provider note for more detail.   Briefly: Patient is 53 y.o. presents to the emergency department for evaluation of some mild pain and swelling in the left lower leg. He reports he first noticed this about 2 days ago when he had some soreness behind the knee. At first he thought this was just related to doing some stretching but then noticed some slight swelling in the lower leg and given his recent cardiac history felt that he should come get checked out for a blood clot. He denies any numbness or weakness in the leg. He has not noticed any discoloration. No other aggravating or alleviating factors. No chest pain or shortness of breath.  Plan:  - dispo pending DVT US . - DVT US  positive for acute DVT involving left femoral, popliteal, and calf veins.  Patient's left leg appears NV intact.  +2 pedal pulse.  Area nontense. - lab workup reassuring today.  - Provided patient with first dose of Eliquis  while in ED which he tolerated well.  Will send Eliquis  starter pack to [employer].  Educated patient on red flags while taking blood thinners such as blood in stool or head trauma.  Recommend following up with PCP.  Patient verbalized understanding of plan.  Patient denies any severe symptoms such as new chest pain or SOB and appears appropriate for follow-up with outpatient PCP. Patient educated to stop ASA use.  - The patient has been appropriately medically screened and/or stabilized in the ED. I have low suspicion for any other emergent medical condition which would require further screening, evaluation or treatment in the ED or require inpatient management. At time of discharge the patient is hemodynamically stable and in no acute distress. I have discussed work-up results and diagnosis with patient and answered all questions. Patient is agreeable with discharge plan. We discussed strict return precautions for  returning to the emergency department and they verbalized understanding.             Hoy Nidia FALCON, [place] 02/11/24 0002    Lenor Hollering, MD 02/11/24 1459

## 2024-02-10 NOTE — ED Triage Notes (Signed)
 Patient to ED reporting 3 days of left lower leg swelling and aching. Patient recently had open heart surgery in June and concerned for DVT.

## 2024-02-11 ENCOUNTER — Other Ambulatory Visit (HOSPITAL_COMMUNITY): Payer: Self-pay

## 2024-02-11 NOTE — Discharge Instructions (Addendum)
 You will need to follow-up with your primary care provider in the next 48 to 72 hours.  Seek emergency care experiencing any new or worsening symptoms such as excruciating left leg pain, blood in your stool, head trauma. Do not take Aspirin  or NSAIDS while on blood thinners.

## 2024-02-16 ENCOUNTER — Telehealth (HOSPITAL_COMMUNITY): Payer: Self-pay

## 2024-02-16 NOTE — Telephone Encounter (Signed)
 Attempted to call patient to schedule cardiac rehab- no answer, left message. Sent MyChart message.

## 2024-02-17 DIAGNOSIS — R002 Palpitations: Secondary | ICD-10-CM | POA: Diagnosis not present

## 2024-02-18 ENCOUNTER — Telehealth (HOSPITAL_COMMUNITY): Payer: Self-pay

## 2024-02-18 NOTE — Telephone Encounter (Signed)
 Patient returned call to get scheduled in cardiac rehab. Patient did state they will only be able to attend CR during August as he is flying out of the country at the beginning of September and will be gone for 3 months. Informed patient he will not benefit from the program as we would not be able to get him in to his desired class time until 8/04 and he would have less than a month of classes total.  Informed patient if anything changes or if he would like to attend the program when he returns to call us  back. Closing referral.

## 2024-02-20 ENCOUNTER — Ambulatory Visit: Payer: Self-pay | Admitting: Cardiology

## 2024-02-23 ENCOUNTER — Other Ambulatory Visit: Payer: Self-pay

## 2024-02-23 MED ORDER — ROSUVASTATIN CALCIUM 40 MG PO TABS: 40.0000 mg | ORAL_TABLET | Freq: Every day | ORAL | 3 refills | Status: AC

## 2024-02-25 ENCOUNTER — Ambulatory Visit (INDEPENDENT_AMBULATORY_CARE_PROVIDER_SITE_OTHER): Admitting: Family Medicine

## 2024-02-25 ENCOUNTER — Encounter: Payer: Self-pay | Admitting: Family Medicine

## 2024-02-25 VITALS — BP 136/80 | HR 73 | Resp 12 | Ht 74.0 in | Wt 224.0 lb

## 2024-02-25 DIAGNOSIS — E785 Hyperlipidemia, unspecified: Secondary | ICD-10-CM

## 2024-02-25 DIAGNOSIS — I10 Essential (primary) hypertension: Secondary | ICD-10-CM | POA: Diagnosis not present

## 2024-02-25 DIAGNOSIS — I82492 Acute embolism and thrombosis of other specified deep vein of left lower extremity: Secondary | ICD-10-CM

## 2024-02-25 DIAGNOSIS — F3341 Major depressive disorder, recurrent, in partial remission: Secondary | ICD-10-CM

## 2024-02-25 DIAGNOSIS — I2510 Atherosclerotic heart disease of native coronary artery without angina pectoris: Secondary | ICD-10-CM

## 2024-02-25 DIAGNOSIS — I82402 Acute embolism and thrombosis of unspecified deep veins of left lower extremity: Secondary | ICD-10-CM | POA: Insufficient documentation

## 2024-02-25 MED ORDER — EZETIMIBE 10 MG PO TABS: 10.0000 mg | ORAL_TABLET | Freq: Every day | ORAL | 3 refills | Status: AC

## 2024-02-25 MED ORDER — METOPROLOL SUCCINATE ER 25 MG PO TB24: 25.0000 mg | ORAL_TABLET | Freq: Every day | ORAL | 2 refills | Status: AC

## 2024-02-25 MED ORDER — APIXABAN 5 MG PO TABS: 5.0000 mg | ORAL_TABLET | Freq: Two times a day (BID) | ORAL | 1 refills | Status: AC

## 2024-02-25 MED ORDER — AMLODIPINE BESYLATE 5 MG PO TABS: 5.0000 mg | ORAL_TABLET | Freq: Every day | ORAL | 2 refills | Status: DC

## 2024-02-25 MED ORDER — SERTRALINE HCL 100 MG PO TABS: 100.0000 mg | ORAL_TABLET | Freq: Every day | ORAL | 2 refills | Status: AC

## 2024-02-25 NOTE — Assessment & Plan Note (Signed)
 Problem is stable. He would like to continue sertraline  100 mg daily.

## 2024-02-25 NOTE — Assessment & Plan Note (Signed)
 BP adequately controlled. Continue amlodipine  5 mg daily, HCTZ 12.5 mg daily, and metoprolol  succinate 25 mg daily as well as low-salt diet.

## 2024-02-25 NOTE — Patient Instructions (Signed)
 A few things to remember from today's visit:  Hypercholesterolemia - Plan: ezetimibe (ZETIA) 10 MG tablet  Depression, major, in remission (HCC) - Plan: sertraline  (ZOLOFT ) 100 MG tablet  Fasting labs in 6-8 weeks. Will plan on 6 months of Eliquis .  If you need refills for medications you take chronically, please call your pharmacy. Do not use My Chart to request refills or for acute issues that need immediate attention. If you send a my chart message, it may take a few days to be addressed, specially if I am not in the office.  Please be sure medication list is accurate. If a new problem present, please set up appointment sooner than planned today.

## 2024-02-25 NOTE — Assessment & Plan Note (Signed)
 Currently on Eliquis  5 mg twice daily, recommend completing 6 months of treatment. I will see him back in 06/2024, before if needed. Planning on a long trip to [place], recommend ambulation every 1-2 hours and compression stocking.

## 2024-02-25 NOTE — Assessment & Plan Note (Signed)
 LDL 105 on 01/31/2024. He did not tolerate Repatha . Currently on rosuvastatin  40 mg daily. He agrees with adding Zetia 10 mg daily. Fasting lipid panel and ALT in 6 to 8 weeks, before going overseas.

## 2024-02-25 NOTE — Assessment & Plan Note (Signed)
 Status post CABG on 12/28/2023. Asymptomatic. Currently on rosuvastatin  40 mg daily, metoprolol  succinate 25 mg daily, and amlodipine  5 mg daily. Stopped Aspirin  after starting Eliquis  to treat acute DVT. Requesting refill on medications due to overseas trip, planning on going to [place] for 2 months.

## 2024-02-25 NOTE — Progress Notes (Signed)
 Chief Complaint  Patient presents with   Medical Management of Chronic Issues   History of Present Illness Francisco White is a 53 year old male with a PMHx significant for HTN, HLD, B12 deficiency, anxiety, and CAD, who is here today for ED follow up and for chronic disease management. Last seen on 05/05/2023.  He visited the emergency department on July 17th due to pain and swelling in his leg. LE Vas ultrasound revealed acute DVT involving the left femoral, popliteal, and calf veins.  The pain in his leg has resolved. He attributes the DVT to being sedentary post-surgery (s/p CABG).  Currently he is on Eliquis  5 mg twice daily. He is tolerating medication well. He is no longer on Aspirin .  No trauma or travel around the time of the DVT dx.   He underwent open heart surgery on June 3rd, which was a bypass procedure,  CABG x 3 on 12/28/23. SABRA Since the surgery, he has not experienced any chest pain.   HLD: He mentions that his LDL is not at goal yet. He was previously on Repatha  for cholesterol management but discontinued it due to shoulder pain. Currently, he is taking rosuvastatin  40 mg.  Lab Results  Component Value Date   CHOL 175 01/31/2024   HDL 46 01/31/2024   LDLCALC 105 (H) 01/31/2024   TRIG 132 01/31/2024   CHOLHDL 3.8 01/31/2024   Hypertension: He is currently taking Amlodipine  5 mg once daily, HCTZ 12.5 mg once daily, and Toprol -XL 25 mg once daily for HTN.  Negative for dyspnea, palpitation, orthopnea, PND, or edema.  BP Readings from Last 3 Encounters:  02/25/24 136/80  02/10/24 119/73  02/08/24 114/72   Depression: Moods are stable and well controlled. Currently taking Zoloft  100 mg once daily.  He reports using sertraline  to manage anxiety/depression and libido, and states that since he is not currently in a relationship, it is probably a good thing to continue it. He has not experienced any issues with these medications.  He plans to travel to  [place] in September and has requested 90-day supplies for his medications to accommodate his travel plans. He has purchased a house in [place] and intends to spend time there. He plans to return in December/2025.  He reports a sensation of discomfort in his ears, which he attributes to aging and possibly hair growth I ear canal , but denies any hearing changes or pain. He does not use Q-tips.  Review of Systems  Constitutional:  Negative for activity change, appetite change, chills and fever.  HENT:  Negative for sore throat.   Respiratory:  Negative for cough and wheezing.   Gastrointestinal:  Negative for abdominal pain, nausea and vomiting.  Endocrine: Negative for cold intolerance and heat intolerance.  Genitourinary:  Negative for decreased urine volume, dysuria and hematuria.  Skin:  Negative for rash.  Neurological:  Negative for syncope, facial asymmetry, weakness and headaches.  Psychiatric/Behavioral:  Negative for confusion and hallucinations.   See other pertinent positives and negatives in HPI.  Current Outpatient Medications on File Prior to Visit  Medication Sig Dispense Refill   acetaminophen  (TYLENOL ) 325 MG tablet Take 2 tablets (650 mg total) by mouth every 6 (six) hours.     hydrochlorothiazide  (HYDRODIURIL ) 12.5 MG tablet Take 1 tablet (12.5 mg total) by mouth daily. 90 tablet 3   Multiple Vitamins-Minerals (EQ MULTIVITAMINS ADULT GUMMY PO) Take 1 each by mouth daily.     nitroGLYCERIN  (NITROSTAT ) 0.4 MG SL  tablet Place 1 tablet (0.4 mg total) under the tongue every 5 (five) minutes as needed for chest pain. Max 3 tablets per event. 90 tablet 3   rosuvastatin  (CRESTOR ) 40 MG tablet Take 1 tablet (40 mg total) by mouth daily. 90 tablet 3   No current facility-administered medications on file prior to visit.    Past Medical History:  Diagnosis Date   CAD (coronary artery disease)    Depression 03/26/2013   Family history of heart disease    History of nuclear stress  test 06/2011   exercise; negative for ischemia, normal pattern of perfusion    HTN (hypertension) 03/26/2013   Hyperlipidemia    Hypertension    Skin sensation disturbance 04/07/2017   Umbilical hernia 03/26/2013   Allergies  Allergen Reactions   Simvastatin Other (See Comments)    Myalgias    Olmesartan  Other (See Comments)    Dizziness, lightheadedness     Social History   Socioeconomic History   Marital status: Married    Spouse name: Not on file   Number of children: 3   Years of education: Not on file   Highest education level: Not on file  Occupational History   Occupation: Quarry manager  Tobacco Use   Smoking status: Never   Smokeless tobacco: Never  Vaping Use   Vaping status: Never Used  Substance and Sexual Activity   Alcohol use: Yes    Comment: occasional    Drug use: No   Sexual activity: Not on file  Other Topics Concern   Not on file  Social History Narrative   Not on file   Social Drivers of Health   Financial Resource Strain: Not on file  Food Insecurity: No Food Insecurity (12/24/2023)   Hunger Vital Sign    Worried About Running Out of Food in the Last Year: Never true    Ran Out of Food in the Last Year: Never true  Transportation Needs: No Transportation Needs (12/24/2023)   PRAPARE - Administrator, Civil Service (Medical): No    Lack of Transportation (Non-Medical): No  Physical Activity: Not on file  Stress: Not on file  Social Connections: Not on file    Vitals:   02/25/24 1543  BP: 136/80  Pulse: 73  Resp: 12  SpO2: 97%   Body mass index is 28.76 kg/m.  Physical Exam Vitals and nursing note reviewed.  Constitutional:      General: He is not in acute distress.    Appearance: He is well-developed.  HENT:     Head: Normocephalic and atraumatic.     Right Ear: Tympanic membrane, ear canal and external ear normal.     Left Ear: Tympanic membrane, ear canal and external ear normal.     Mouth/Throat:      Mouth: Mucous membranes are moist.  Eyes:     Conjunctiva/sclera: Conjunctivae normal.  Cardiovascular:     Rate and Rhythm: Normal rate and regular rhythm.     Pulses:          Dorsalis pedis pulses are 2+ on the right side and 2+ on the left side.     Heart sounds: No murmur heard.    Comments: Left calf diameter mildly bigger than right one. No tenderness or erythema. Pulmonary:     Effort: Pulmonary effort is normal. No respiratory distress.     Breath sounds: Normal breath sounds.  Abdominal:     Palpations: Abdomen is soft. There is no hepatomegaly  or mass.     Tenderness: There is no abdominal tenderness.  Lymphadenopathy:     Cervical: No cervical adenopathy.  Skin:    General: Skin is warm.     Findings: No erythema or rash.  Neurological:     Mental Status: He is alert and oriented to person, place, and time.     Cranial Nerves: No cranial nerve deficit.     Gait: Gait normal.  Psychiatric:        Mood and Affect: Mood and affect normal.   ASSESSMENT AND PLAN: Francisco White was seen today for chronic disease management.  Orders Placed This Encounter  Procedures   Lipid panel   ALT   Deep vein thrombosis (DVT) of other vein of left lower extremity, unspecified chronicity (HCC) Assessment & Plan: Currently on Eliquis  5 mg twice daily, recommend completing 6 months of treatment. I will see him back in 06/2024, before if needed. Planning on a long trip to [place], recommend ambulation every 1-2 hours and compression stocking.  Orders: -     Apixaban ; Take 1 tablet (5 mg total) by mouth 2 (two) times daily.  Dispense: 90 tablet; Refill: 1  Hyperlipidemia LDL goal <70 Assessment & Plan: LDL 105 on 01/31/2024. He did not tolerate Repatha . Currently on rosuvastatin  40 mg daily. He agrees with adding Zetia 10 mg daily. Fasting lipid panel and ALT in 6 to 8 weeks, before going overseas.  Orders: -     Ezetimibe; Take 1 tablet (10 mg total) by mouth daily.   Dispense: 90 tablet; Refill: 3 -     Lipid panel; Future -     ALT; Future  Coronary artery disease involving native coronary artery of native heart without angina pectoris Assessment & Plan: Status post CABG on 12/28/2023. Asymptomatic. Currently on rosuvastatin  40 mg daily, metoprolol  succinate 25 mg daily, and amlodipine  5 mg daily. Stopped Aspirin  after starting Eliquis  to treat acute DVT. Requesting refill on medications due to overseas trip, planning on going to [place] for 2 months.  Orders: -     Metoprolol  Succinate ER; Take 1 tablet (25 mg total) by mouth daily.  Dispense: 90 tablet; Refill: 2  Primary hypertension Assessment & Plan: BP adequately controlled. Continue amlodipine  5 mg daily, HCTZ 12.5 mg daily, and metoprolol  succinate 25 mg daily as well as low-salt diet.  Orders: -     amLODIPine  Besylate; Take 1 tablet (5 mg total) by mouth daily.  Dispense: 90 tablet; Refill: 2 -     Metoprolol  Succinate ER; Take 1 tablet (25 mg total) by mouth daily.  Dispense: 90 tablet; Refill: 2  Recurrent major depressive disorder, in partial remission [facility]) Assessment & Plan: Problem is stable. He would like to continue sertraline  100 mg daily.  Orders: -     Sertraline  HCl; Take 1 tablet (100 mg total) by mouth daily. Due for physical  Dispense: 90 tablet; Refill: 2  In regard to ear discomfort, examination today normal. Monitor for new symptoms.  I spent a total of 43 minutes in both face to face and non face to face activities for this visit on the date of this encounter. During this time history was obtained and documented, examination was performed, prior labs/imaging reviewed, and assessment/plan discussed.  Return in about 20 weeks (around 07/14/2024) for chronic problems. Fasting labs in 6 weeks.SABRA LILLETTE Vernell Gailen, acting as a scribe for Karsyn Jamie [place], MD., have documented all relevant documentation on the behalf of Baker Kogler [place],  MD, as directed by   while in the  presence of Lanetta Figuero [place], MD.  I, Elison Worrel [place], MD, have reviewed all documentation for this visit. The documentation on 02/25/24 for the exam, diagnosis, procedures, and orders are all accurate and complete.  Carroll Ranney G. [place], MD  [facility]

## 2024-02-29 ENCOUNTER — Other Ambulatory Visit: Payer: Self-pay | Admitting: Cardiology

## 2024-02-29 ENCOUNTER — Other Ambulatory Visit (HOSPITAL_COMMUNITY): Payer: Self-pay

## 2024-02-29 MED ORDER — AMLODIPINE BESYLATE 5 MG PO TABS
5.0000 mg | ORAL_TABLET | Freq: Every day | ORAL | 3 refills | Status: AC
  Filled 2024-02-29: qty 30, 30d supply, fill #0
  Filled 2024-03-01: qty 60, 60d supply, fill #1
  Filled 2024-06-19 (×2): qty 30, 30d supply, fill #2
  Filled 2024-07-27: qty 30, 30d supply, fill #3
  Filled 2024-08-23: qty 30, 30d supply, fill #4

## 2024-02-29 MED ORDER — APIXABAN 5 MG PO TABS
5.0000 mg | ORAL_TABLET | Freq: Two times a day (BID) | ORAL | 1 refills | Status: AC
  Filled 2024-02-29: qty 60, 30d supply, fill #0
  Filled 2024-03-28: qty 60, 30d supply, fill #1
  Filled 2024-06-19 (×2): qty 60, 30d supply, fill #2
  Filled 2024-07-27: qty 60, 30d supply, fill #3
  Filled 2024-08-23: qty 60, 30d supply, fill #4

## 2024-02-29 MED ORDER — METOPROLOL SUCCINATE ER 25 MG PO TB24
25.0000 mg | ORAL_TABLET | Freq: Every day | ORAL | 2 refills | Status: AC
  Filled 2024-02-29: qty 30, 30d supply, fill #0
  Filled 2024-03-01: qty 60, 60d supply, fill #1
  Filled 2024-06-19 (×2): qty 30, 30d supply, fill #2
  Filled 2024-07-27: qty 30, 30d supply, fill #3
  Filled 2024-08-23: qty 30, 30d supply, fill #4

## 2024-02-29 MED ORDER — EZETIMIBE 10 MG PO TABS
10.0000 mg | ORAL_TABLET | Freq: Every day | ORAL | 3 refills | Status: AC
  Filled 2024-02-29: qty 30, 30d supply, fill #0
  Filled 2024-03-01: qty 60, 60d supply, fill #1
  Filled 2024-06-19 (×2): qty 30, 30d supply, fill #2
  Filled 2024-07-27: qty 30, 30d supply, fill #3
  Filled 2024-08-23: qty 30, 30d supply, fill #4

## 2024-02-29 MED ORDER — SERTRALINE HCL 100 MG PO TABS
100.0000 mg | ORAL_TABLET | Freq: Every day | ORAL | 2 refills | Status: AC
  Filled 2024-02-29: qty 30, 30d supply, fill #0
  Filled 2024-03-01: qty 90, 90d supply, fill #1
  Filled 2024-03-01: qty 60, 60d supply, fill #1
  Filled 2024-06-19 (×2): qty 30, 30d supply, fill #2
  Filled 2024-07-27: qty 30, 30d supply, fill #3
  Filled 2024-08-23: qty 30, 30d supply, fill #4

## 2024-03-01 ENCOUNTER — Other Ambulatory Visit (HOSPITAL_COMMUNITY): Payer: Self-pay

## 2024-03-08 ENCOUNTER — Encounter: Payer: Self-pay | Admitting: Family Medicine

## 2024-03-15 NOTE — Progress Notes (Unsigned)
 Cardiology Office Note    Date:  03/16/2024  ID:  Francisco White, DOB June 16, 1971, MRN [id number] PCP:  [place], Betty G, MD  Cardiologist:  Jerel Balding, MD  Electrophysiologist:  None   Chief Complaint: Follow up for CAD   History of Present Illness: .    Francisco White is a 53 y.o. male with visit-pertinent history of CAD (xience 3.5x58mm stent to the LAD in 10/2018), s/p CABGx3 with LIMA to LAD, SVG to PDA and radial artery to OM on 12/28/23, hypertension, hyperlipidemia.  Patient was previously followed by Dr. Balding remotely, then followed by Dr. Vina Gull from 2021 through 2023.  Patient was last seen in clinic on 12/22/2023 by Dr. Barbaraann for an acute visit regarding chest discomfort and shortness of breath.  Patient reported that since April 2025 he had been experiencing ongoing symptoms of chest discomfort described as tightness and burning accompanied by shortness of breath and feeling of indigestion.  In mid May 2025 he was evaluated at [facility], a coronary CTA was performed at that time.  He recalled being informed of concerns regarding his right coronary artery and possible multivessel disease although a cardiac catheterization was not conducted at that time.  At visit patient reported his chest symptoms had been occurring daily and had progressively worsened since April. On 05/30 he underwent outpatient cardiac catheterization which showed 50-80% stenosis of the LAD, 80% stenosis of the circumflex, and 70-99% stenosis of the RCA. Due to unstable angina he was admitted to the hospital and started on a heparin  drip with expedited cardiothoracic surgical evaluation.  Echo on 12/24/2023 indicated LVEF of 60 to 65%, no RWMA, diastolic parameters were normal, RV systolic function and size was normal, no significant valvular abnormalities.  On 12/28/2023 he underwent CABG x 3 with LIMA to LAD, SVG to PDA and radial artery to OM.  He underwent endoscopic harvest of  greater saphenous vein from his right thigh and open left radial artery harvest.  By third postoperative day he was completely independent with mobility and transfers.  Patient had an uncomplicated postoperative course and was ready for discharge on 01/01/2024.  Patient was seen by Dr. Shyrl on 01/06/2024 [facility] telephone visit.  Patient reported he been doing well, pain was minimal and vitals were stable.   Patient was last in clinic on 01/14/2024 for follow-up.  He reported he been doing very well.  He denied a significant chest pain, shortness of breath, lower extremity edema, orthopnea or PND.  Patient reported a 30-minute episode the day prior in which it felt like its though his heart was fluttering, described as a loose sensation in his chest as well as a feeling of increased heart rate.  He denied any associated symptoms.  2-week cardiac monitor showed dominant rhythm was normal sinus rhythm, rare PVCs and PACs, no evidence of complex ventricular arrhythmia or ventricular tachycardia, no evidence of atrial fibrillation.  There were 2 very brief episodes of paroxysmal atrial tachycardia consisting of 5 and 6 beats respectively.  Today he presents for follow-up.  He reports that he has been doing well overall. He denies chest pain, shortness of breath, lower extremity edema, orthopnea or PND.  Patient reports that he is tolerating Eliquis  well, reports that his sharp pain and swelling related to his DVT has resolved, reports adherence with Eliquis .  Patient reports that he is now attending the gym regularly, is walking on the treadmill and doing light weights, reports that he tolerates  this overall well, feels that sometimes he will have a slight indigestion feeling with prolonged exercise, he feels this is more related to anxiety related to his history.  He denies any chest pressure, pain or tightness with exercise, also denies shortness of breath with exercise.  ROS: .   Today he denies chest pain,  shortness of breath, lower extremity edema, fatigue, palpitations, melena, hematuria, hemoptysis, diaphoresis, weakness, presyncope, syncope, orthopnea, and PND.  All other systems are reviewed and otherwise negative. Studies Reviewed: SABRA   EKG:  EKG is not ordered today.  CV Studies: Cardiac studies reviewed are outlined and summarized above. Otherwise please see EMR for full report. Cardiac Studies & Procedures   ______________________________________________________________________________________________ CARDIAC CATHETERIZATION  CARDIAC CATHETERIZATION 12/24/2023  Conclusion   Dist LM to Ost LAD lesion is 50% stenosed.   Prox LAD to Mid LAD lesion is 75% stenosed.   Mid LAD lesion is 80% stenosed.   Ost Cx to Prox Cx lesion is 80% stenosed.   Prox RCA lesion is 99% stenosed.   Mid RCA to Dist RCA lesion is 70% stenosed.   1st Mrg lesion is 25% stenosed.  1.  Proximal LAD stent with high-grade in-stent restenosis. 2.  Severe multivessel disease. 3.  LVEDP of 22 mmHg.  Recommendation: Given extremely high-grade right coronary artery lesion in conjunction with chest pain symptoms at rest last night, will admit to the hospital, place on heparin  drip, and obtain expedited cardiothoracic surgical evaluation.  Findings Coronary Findings Diagnostic  Dominance: Right  Left Main Dist LM to Ost LAD lesion is 50% stenosed.  Left Anterior Descending Prox LAD to Mid LAD lesion is 75% stenosed. The lesion was previously treated . Mid LAD lesion is 80% stenosed.  Left Circumflex Ost Cx to Prox Cx lesion is 80% stenosed.  First Obtuse Marginal Branch 1st Mrg lesion is 25% stenosed.  Right Coronary Artery There is mild diffuse disease throughout the vessel. Prox RCA lesion is 99% stenosed. Mid RCA to Dist RCA lesion is 70% stenosed.  Right Posterior Descending Artery There is mild disease in the vessel.  Third Right Posterolateral Branch Collaterals 3rd RPL filled by collaterals  from 3rd Mrg.  Intervention  No interventions have been documented.   STRESS TESTS  NM MYOCAR MULTI W/SPECT W 07/01/2011   ECHOCARDIOGRAM  ECHOCARDIOGRAM COMPLETE 12/24/2023  Narrative ECHOCARDIOGRAM REPORT    Patient Name:   Francisco White Date of Exam: 12/24/2023 Medical Rec #:  [id number]        Height:       74.0 in Accession #:    [id number]       Weight:       217.1 lb Date of Birth:  11-May-1971        BSA:          2.251 m Patient Age:    53 years         BP:           119/72 mmHg Patient Gender: M                HR:           47 bpm. Exam Location:  Inpatient  Procedure: 2D Echo, 3D Echo, Cardiac Doppler, Color Doppler, Strain Analysis and Intracardiac Opacification Agent (Both Spectral and Color Flow Doppler were utilized during procedure).  Indications:    CAD native vessel  History:        Patient has prior history of Echocardiogram examinations, most recent 01/03/2021.  CAD; Risk Factors:Hypertension and Dyslipidemia.  Sonographer:    Juanita Shaw Referring Phys: [id number] HARRELL O LIGHTFOOT  IMPRESSIONS   1. Left ventricular ejection fraction, by estimation, is 60 to 65%. Left ventricular ejection fraction by 3D volume is 66 %. The left ventricle has normal function. The left ventricle has no regional wall motion abnormalities. Left ventricular diastolic parameters were normal. The average left ventricular global longitudinal strain is -18.4 %. The global longitudinal strain is normal. 2. Right ventricular systolic function is normal. The right ventricular size is normal. 3. The mitral valve is normal in structure. No evidence of mitral valve regurgitation. No evidence of mitral stenosis. 4. The aortic valve is normal in structure. Aortic valve regurgitation is not visualized. No aortic stenosis is present. 5. The inferior vena cava is normal in size with greater than 50% respiratory variability, suggesting right atrial pressure of 3 mmHg.  FINDINGS Left  Ventricle: Left ventricular ejection fraction, by estimation, is 60 to 65%. Left ventricular ejection fraction by 3D volume is 66 %. The left ventricle has normal function. The left ventricle has no regional wall motion abnormalities. The average left ventricular global longitudinal strain is -18.4 %. Strain was performed and the global longitudinal strain is normal. The left ventricular internal cavity size was normal in size. There is no left ventricular hypertrophy. Left ventricular diastolic parameters were normal.  Right Ventricle: The right ventricular size is normal. No increase in right ventricular wall thickness. Right ventricular systolic function is normal.  Left Atrium: Left atrial size was normal in size.  Right Atrium: Right atrial size was normal in size.  Pericardium: There is no evidence of pericardial effusion.  Mitral Valve: The mitral valve is normal in structure. No evidence of mitral valve regurgitation. No evidence of mitral valve stenosis. MV peak gradient, 2.9 mmHg. The mean mitral valve gradient is 1.0 mmHg.  Tricuspid Valve: The tricuspid valve is normal in structure. Tricuspid valve regurgitation is not demonstrated. No evidence of tricuspid stenosis.  Aortic Valve: The aortic valve is normal in structure. Aortic valve regurgitation is not visualized. No aortic stenosis is present. Aortic valve mean gradient measures 4.0 mmHg. Aortic valve peak gradient measures 8.1 mmHg. Aortic valve area, by VTI measures 3.18 cm.  Pulmonic Valve: The pulmonic valve was normal in structure. Pulmonic valve regurgitation is not visualized. No evidence of pulmonic stenosis.  Aorta: The aortic root is normal in size and structure.  Venous: The inferior vena cava is normal in size with greater than 50% respiratory variability, suggesting right atrial pressure of 3 mmHg.  IAS/Shunts: No atrial level shunt detected by color flow Doppler.  Additional Comments: 3D was performed not  requiring image post processing on an independent workstation and was normal.   LEFT VENTRICLE PLAX 2D LVIDd:         5.30 cm         Diastology LVIDs:         3.30 cm         LV e' medial:    9.57 cm/s LV PW:         0.80 cm         LV E/e' medial:  7.7 LV IVS:        0.80 cm         LV e' lateral:   12.80 cm/s LVOT diam:     2.00 cm         LV E/e' lateral: 5.7 LV SV:  82 LV SV Index:   36              2D Longitudinal LVOT Area:     3.14 cm        Strain 2D Strain GLS   -18.4 % Avg: LV Volumes (MOD) LV vol d, MOD    144.0 ml      3D Volume EF A2C:                           LV 3D EF:    Left LV vol d, MOD    156.0 ml                   ventricul A4C:                                        ar LV vol s, MOD    58.0 ml                    ejection A2C:                                        fraction LV vol s, MOD    64.1 ml                    by 3D A4C:                                        volume is LV SV MOD A2C:   86.0 ml                    66 %. LV SV MOD A4C:   156.0 ml LV SV MOD BP:    92.8 ml 3D Volume EF: 3D EF:        66 %  RIGHT VENTRICLE             IVC RV Basal diam:  4.20 cm     IVC diam: 1.30 cm RV Mid diam:    3.30 cm RV S prime:     12.30 cm/s TAPSE (M-mode): 3.5 cm  LEFT ATRIUM             Index        RIGHT ATRIUM           Index LA diam:        3.10 cm 1.38 cm/m   RA Area:     14.10 cm LA Vol (A2C):   51.7 ml 22.97 ml/m  RA Volume:   35.10 ml  15.60 ml/m LA Vol (A4C):   37.0 ml 16.44 ml/m LA Biplane Vol: 45.0 ml 19.99 ml/m AORTIC VALVE                    PULMONIC VALVE AV Area (Vmax):    3.01 cm     PV Vmax:       1.04 m/s AV Area (Vmean):   2.88 cm     PV Peak grad:  4.3 mmHg AV Area (VTI):     3.18 cm AV Vmax:           142.00  cm/s AV Vmean:          92.600 cm/s AV VTI:            0.257 m AV Peak Grad:      8.1 mmHg AV Mean Grad:      4.0 mmHg LVOT Vmax:         136.00 cm/s LVOT Vmean:        84.900 cm/s LVOT VTI:          0.260  m LVOT/AV VTI ratio: 1.01  AORTA Ao Root diam: 3.50 cm Ao Asc diam:  3.30 cm  MITRAL VALVE MV Area (PHT): 3.54 cm    SHUNTS MV Area VTI:   2.65 cm    Systemic VTI:  0.26 m MV Peak grad:  2.9 mmHg    Systemic Diam: 2.00 cm MV Mean grad:  1.0 mmHg MV Vmax:       0.84 m/s MV Vmean:      40.0 cm/s MV Decel Time: 214 msec MV E velocity: 73.30 cm/s MV A velocity: 61.90 cm/s MV E/A ratio:  1.18  Morene Brownie Electronically signed by Morene Brownie Signature Date/Time: 12/24/2023/5:35:17 PM    Final   TEE  ECHO INTRAOPERATIVE TEE 12/28/2023  Narrative *INTRAOPERATIVE TRANSESOPHAGEAL REPORT *    Patient Name:   Francisco White Date of Exam: 12/28/2023 Medical Rec #:  [id number]        Height:       74.0 in Accession #:    [id number]       Weight:       217.1 lb Date of Birth:  07/26/1971        BSA:          2.25 m Patient Age:    53 years         BP:           114/84 mmHg Patient Gender: M                HR:           38 bpm. Exam Location:  Anesthesiology  Transesophogeal exam was perform intraoperatively during surgical procedure. Patient was closely monitored under general anesthesia during the entirety of examination.  Indications:     R07.89 Other chest pain; I20.9 Angina pectoris, unspecified; CABG Performing Phys: [id number] LINNIE KIDD LIGHTFOOT Diagnosing Phys: Kelly Mace MD  Complications: No known complications during this procedure. POST-OP IMPRESSIONS The ECHO probe/machine malfunctioned. ECHO was not used post cardiopulmonary bypass.  PRE-OP FINDINGS Left Ventricle: The left ventricle has low normal systolic function, with an ejection fraction of 50-55%, measured 53%. The cavity size was normal. Left ventrical global hypokinesis without regional wall motion abnormalities. There is no left ventricular hypertrophy. Left ventricular diastolic function  was not evaluated.  Right Ventricle: The right ventricle has normal systolic function. The cavity  was normal. There is no increase in right ventricular wall thickness.  Left Atrium: Left atrial size was normal in size. No left atrial/left atrial appendage thrombus was detected. Left atrial appendage velocity is normal at greater than 40 cm/s.  Right Atrium: Right atrial size was normal in size. Catheter present in the right atrium.  Interatrial Septum: No atrial level shunt detected by color flow Doppler. There is no evidence of a patent foramen ovale.  Pericardium: There is no evidence of pericardial effusion.  Mitral Valve: The mitral valve is normal in structure. Mitral valve regurgitation is trivial by color flow Doppler. There is no evidence  of mitral valve vegetation. Pulmonary venous flow is normal. There is no evidence of mitral stenosis, with mean gradient 1 mmHg, peak gradient 3 mmHg.  Tricuspid Valve: The tricuspid valve was normal in structure. Tricuspid valve regurgitation is trivial by color flow Doppler. No evidence of tricuspid stenosis is present. There is no evidence of tricuspid valve vegetation.  Aortic Valve: The aortic valve is normal in structure. Aortic valve regurgitation was not visualized by color flow Doppler. There is no stenosis of the aortic valve, with peak gradient 6 mmHg, mean gradient 3 mmHg. There is no evidence of aortic valve vegetation.  Pulmonic Valve: The pulmonic valve was normal in structure, with normal leaflet mobility. No evidence of pulmonic stenosis. Pulmonic valve regurgitation is mild by color flow Doppler.   Aorta: The aortic root, ascending aorta and aortic arch are normal in size and structure. There is evidence of plaque in the descending aorta; Grade I, measuring 1-56mm in size.  Pulmonary Artery: The pulmonary artery is of normal size.  Venous: The inferior vena cava is normal in size with greater than 50% respiratory variability, suggesting right atrial pressure of 3 mmHg.  Shunts: There is no evidence of an atrial septal  defect.  +--------------+-------++ LEFT VENTRICLE        +--------------+-------++ PLAX 2D               +--------------+-------++ LVIDd:        5.40 cm +--------------+-------++ LVIDs:        3.90 cm +--------------+-------++ LV SV:        75 ml   +--------------+-------++ LV SV Index:  33.03   +--------------+-------++                       +--------------+-------++  +-------------+-----------++ AORTIC VALVE             +-------------+-----------++ AV Vmax:     125.00 cm/s +-------------+-----------++ AV Vmean:    83.400 cm/s +-------------+-----------++ AV VTI:      0.293 m     +-------------+-----------++ AV Peak Grad:6.2 mmHg    +-------------+-----------++ AV Mean Grad:3.0 mmHg    +-------------+-----------++  +-------------+---------++ MITRAL VALVE           +-------------+---------++ MV Peak grad:2.8 mmHg  +-------------+---------++ MV Mean grad:1.0 mmHg  +-------------+---------++ MV Vmax:     0.84 m/s  +-------------+---------++ MV Vmean:    38.8 cm/s +-------------+---------++ MV VTI:      0.27 m    +-------------+---------++   Kelly Mace MD Electronically signed by Kelly Mace MD Signature Date/Time: 12/28/2023/2:54:51 PM    Final  MONITORS  LONG TERM MONITOR-LIVE TELEMETRY (3-14 DAYS) 02/17/2024  Narrative   The dominant rhythm was normal sinus rhythm with normal circadian variation.   There are rare premature supraventricular beats and rare premature ventricular beats.   There is no evidence of complex ventricular arrhythmia or ventricular tachycardia.  There is no evidence of atrial fibrillation.   There are 2 very brief episodes of paroxysmal atrial tachycardia consisting of 5 and 6 beats respectively.  These are of doubtful clinical significance.  Essentially normal arrhythmia monitor.  2 very brief episodes of paroxysmal atrial tachycardia (maximum 6  beats) are recorded and are probably not clinically significant.  Patch Wear Time:  14 days and 0 hours (2025-06-26T10:25:53-0400 to 2025-07-10T10:25:53-0400)  Patient had a min HR of 46 bpm, max HR of 148 bpm, and avg HR of 62 bpm. Predominant underlying rhythm was Sinus Rhythm. 2 Supraventricular Tachycardia runs  occurred, the run with the fastest interval lasting 6 beats with a max rate of 148 bpm, the longest lasting 5 beats with an avg rate of 108 bpm. Isolated SVEs were rare (<1.0%), SVE Couplets were rare (<1.0%), and SVE Triplets were rare (<1.0%). Isolated VEs were rare (<1.0%), and no VE Couplets or VE Triplets were present.       ______________________________________________________________________________________________       Current Reported Medications:.    Current Meds  Medication Sig   acetaminophen  (TYLENOL ) 325 MG tablet Take 2 tablets (650 mg total) by mouth every 6 (six) hours.   amLODipine  (NORVASC ) 5 MG tablet Take 1 tablet (5 mg total) by mouth daily.   apixaban  (ELIQUIS ) 5 MG TABS tablet Take 1 tablet (5 mg total) by mouth 2 (two) times daily.   ezetimibe  (ZETIA ) 10 MG tablet Take 1 tablet (10 mg total) by mouth daily.   hydrochlorothiazide  (HYDRODIURIL ) 12.5 MG tablet Take 1 tablet (12.5 mg total) by mouth daily.   metoprolol  succinate (TOPROL  XL) 25 MG 24 hr tablet Take 1 tablet (25 mg total) by mouth daily.   Multiple Vitamins-Minerals (EQ MULTIVITAMINS ADULT GUMMY PO) Take 1 each by mouth daily.   nitroGLYCERIN  (NITROSTAT ) 0.4 MG SL tablet Place 1 tablet (0.4 mg total) under the tongue every 5 (five) minutes as needed for chest pain. Max 3 tablets per event.   rosuvastatin  (CRESTOR ) 40 MG tablet Take 1 tablet (40 mg total) by mouth daily.   sertraline  (ZOLOFT ) 100 MG tablet Take 1 tablet (100 mg total) by mouth daily. Due for physical    Physical Exam:    VS:  BP 112/68   Pulse 61   Ht 6' 2 (1.88 m)   Wt 221 lb 3.2 oz (100.3 kg)   SpO2 97%   BMI  28.40 kg/m    Wt Readings from Last 3 Encounters:  03/16/24 221 lb 3.2 oz (100.3 kg)  02/25/24 224 lb (101.6 kg)  02/08/24 220 lb (99.8 kg)    GEN: Well nourished, well developed in no acute distress NECK: No JVD; No carotid bruits CARDIAC: RRR, no murmurs, rubs, gallops RESPIRATORY:  Clear to auscultation without rales, wheezing or rhonchi  ABDOMEN: Soft, non-tender, non-distended EXTREMITIES:  No edema; No acute deformity     Asessement and Plan:.    CAD: S/p xience 3.5x37mm stent to the LAD in 10/2018, s/p CABGx3 with LIMA to LAD, SVG to PDA and radial artery to OM on 12/28/23. Today he reports that he is overall doing well, he denies any significant chest pain or shortness of breath.  He does note that sometimes during exercise he will have a feeling of slight indigestion, he feels this is related to anxiety with his history, he will continue to monitor and notify the office if any worsening. Reviewed ED precautions. Continue amlodipine  5 mg daily, hydrochlorothiazide  12.5 mg daily, Crestor  40 mg daily and metoprolol  succinate 25 mg daily.   Palpitations: At last OV patient reported an episode of palpitations the day prior that lasted for 30 minutes, described as a fluttering sensation in his chest, denied any associated symptoms.  2-week cardiac monitor indicated dominant rhythm was normal sinus rhythm, rare PVCs and PACs, no evidence of complex ventricular arrhythmia or ventricular tachycardia, no evidence of atrial fibrillation.  There were 2 very brief episodes of paroxysmal atrial tachycardia consisting of 5 and 6 beats respectively.  Patient denies any further palpitations or feeling of irregular heartbeats.  Hypertension: Blood pressure today 112/68.  Continue current antihypertensive regimen.  Hyperlipidemia: Last lipid profile on 01/31/2024 indicated total cholesterol 175, HDL 46, triglycerides 132 and LDL 105.  Patient was referred to Pharm.[facility]. lipid clinic however does not appear to  have followed up.  Patient reports that he discussed with his PCP and was recently started on ezetimibe .  He is to have follow-up lab work completed in a few weeks.  DVT: Patient presented to the ED on 02/10/2024 with pain behind the left knee, DVT ultrasound positive for acute DVT involving left femoral, popliteal and calf veins, patient was started on Eliquis . Today he reports that he is doing well, notes that sharp pain and erythema has resolved.  He reports that he is tolerating Eliquis  well, emphasized importance of adherence to Eliquis .   Disposition: F/u with Dr. Francyne in 4 months.   Signed, Niccolas Loeper D Kienan Doublin, NP

## 2024-03-16 ENCOUNTER — Ambulatory Visit: Attending: Cardiology | Admitting: Cardiology

## 2024-03-16 ENCOUNTER — Encounter: Payer: Self-pay | Admitting: Cardiology

## 2024-03-16 VITALS — BP 112/68 | HR 61 | Ht 74.0 in | Wt 221.2 lb

## 2024-03-16 DIAGNOSIS — E782 Mixed hyperlipidemia: Secondary | ICD-10-CM

## 2024-03-16 DIAGNOSIS — I2510 Atherosclerotic heart disease of native coronary artery without angina pectoris: Secondary | ICD-10-CM | POA: Diagnosis not present

## 2024-03-16 DIAGNOSIS — Z951 Presence of aortocoronary bypass graft: Secondary | ICD-10-CM

## 2024-03-16 DIAGNOSIS — R002 Palpitations: Secondary | ICD-10-CM | POA: Diagnosis not present

## 2024-03-16 DIAGNOSIS — I10 Essential (primary) hypertension: Secondary | ICD-10-CM

## 2024-03-16 DIAGNOSIS — I82492 Acute embolism and thrombosis of other specified deep vein of left lower extremity: Secondary | ICD-10-CM

## 2024-03-16 NOTE — Patient Instructions (Signed)
 Medication Instructions:  No changes *If you need a refill on your cardiac medications before your next appointment, please call your pharmacy*  Lab Work: No labs  Testing/Procedures: No testing  Follow-Up: At [facility], you and your health needs are our priority.  As part of our continuing mission to provide you with exceptional heart care, our providers are all part of one team.  This team includes your primary Cardiologist (physician) and Advanced Practice Providers or APPs (Physician Assistants and Nurse Practitioners) who all work together to provide you with the care you need, when you need it.  Your next appointment:   4 month(s)  Provider:   Luana Rumple, MD   We recommend signing up for the patient portal called "MyChart".  Sign up information is provided on this After Visit Summary.  MyChart is used to connect with patients for Virtual Visits (Telemedicine).  Patients are able to view lab/test results, encounter notes, upcoming appointments, etc.  Non-urgent messages can be sent to your provider as well.   To learn more about what you can do with MyChart, go to [email or web address].

## 2024-03-22 ENCOUNTER — Other Ambulatory Visit (HOSPITAL_COMMUNITY): Payer: Self-pay

## 2024-03-28 ENCOUNTER — Other Ambulatory Visit

## 2024-03-28 ENCOUNTER — Other Ambulatory Visit (HOSPITAL_COMMUNITY): Payer: Self-pay

## 2024-04-07 ENCOUNTER — Other Ambulatory Visit (HOSPITAL_COMMUNITY): Payer: Self-pay

## 2024-05-24 ENCOUNTER — Other Ambulatory Visit: Payer: Self-pay | Admitting: Family Medicine

## 2024-05-24 DIAGNOSIS — F325 Major depressive disorder, single episode, in full remission: Secondary | ICD-10-CM

## 2024-06-19 ENCOUNTER — Other Ambulatory Visit (HOSPITAL_COMMUNITY): Payer: Self-pay

## 2024-06-19 ENCOUNTER — Other Ambulatory Visit: Payer: Self-pay

## 2024-07-10 ENCOUNTER — Encounter: Payer: Self-pay | Admitting: Cardiovascular Disease

## 2024-07-10 ENCOUNTER — Ambulatory Visit: Attending: Cardiovascular Disease | Admitting: Cardiovascular Disease

## 2024-07-10 VITALS — BP 105/67 | HR 56 | Ht 74.0 in | Wt 227.8 lb

## 2024-07-10 DIAGNOSIS — E782 Mixed hyperlipidemia: Secondary | ICD-10-CM

## 2024-07-10 DIAGNOSIS — I2510 Atherosclerotic heart disease of native coronary artery without angina pectoris: Secondary | ICD-10-CM

## 2024-07-10 DIAGNOSIS — I10 Essential (primary) hypertension: Secondary | ICD-10-CM

## 2024-07-10 DIAGNOSIS — R002 Palpitations: Secondary | ICD-10-CM

## 2024-07-10 NOTE — Progress Notes (Unsigned)
 Patient ID: Francisco White, male   DOB: 1971-03-31, 53 y.o.   MRN: [id number]    Cardiology Office Note    Date:  07/10/2024   ID:  Francisco White, DOB [birth year], MRN [id number]  PCP:  Jordan, Betty G, MD  Cardiologist:   Jerel Balding, MD   No chief complaint on file.   History of Present Illness:  Francisco White is a 53 y.o. male with a history of coronary artery disease (Xience 3.5 x 18 drug-eluting stent to LAD in 2010; unstable angina Dec 17, 2023 with cardiac catheterization showing progression to multivessel CAD and subsequent CABG x 3 with LIMA-LAD, SVG-RPDA, radial-OM T graft off SVG), recent left femoral DVT (July 2025), hypertension, hyperlipidemia, history of depression.  For the last few years, Francisco White has been the single parent for his 2 daughters, both of them very accomplished academically.  Francisco White has recovered very well from his bypass surgery roughly 6 months ago.  Roughly 6 weeks after surgery he had left femoral-popliteal DVT treated with apixaban .  He went on his usual trip to [place], [place] for 3 months and while there was running out of his Eliquis  5 mg, so he was taking it only once daily.  He has slightly prominent superficial veins in the left calf and ankle, but the swelling and pain have long resolved.    He is exercising at the gym regularly, walking on the treadmill and lifting light weights.  He does not feel limited by shortness of breath or angina.  Has not had lower extremity edema, palpitations, dizziness, syncope.  His mood is good.  Denies any focal neurological complaints.  He is exercising regularly.  He does cardio workouts 4 days a week and lifts weights another 4 days in the week.  He does not have any problems with angina or dyspnea either at rest or with activity.  He has not had palpitations, dizziness, syncope, focal neurological complaints, intermittent claudication, lower extremity edema, orthopnea, PND.  He reports that his mood is  stable.  Cardiac catheterization performed in May 2025 for unstable angina showed high-grade in-stent restenosis in the proximal LAD as well as 99% stenosis of the proximal right coronary artery and 80% ostial left circumflex stenosis so he was referred for coronary bypass surgery.  Echocardiogram showed preserved left ventricular systolic function with EF 60 to 65%.  Preoperative carotid duplex ultrasound showed only mild plaque with less than 40% stenosis bilaterally.  On 12/28/2023 he underwent CABG x 3 (LIMA-LAD, SVG-RPDA, radial-OM placed as a T graft off the SVG).  He was discharged 01/01/2024.  He was seen back in the emergency room 02/10/2024 and diagnosed with acute DVT involving the left femoral popliteal and calf veins.  He also wore a 2-week arrhythmia monitor showed only rare PACs and PVCs 2 very brief episodes of 5-6 beat atrial tachycardia.  Past Medical History:  Diagnosis Date   CAD (coronary artery disease)    Depression 03/26/2013   Family history of heart disease    History of nuclear stress test 06/2011   exercise; negative for ischemia, normal pattern of perfusion    HTN (hypertension) 03/26/2013   Hyperlipidemia    Hypertension    Skin sensation disturbance 04/07/2017   Umbilical hernia 03/26/2013    Past Surgical History:  Procedure Laterality Date   APPENDECTOMY  1977   CORONARY ANGIOPLASTY WITH STENT PLACEMENT  10/2008   xience 3.5x18mm stent to LAD ([place])   CORONARY ARTERY BYPASS GRAFT N/A  12/28/2023   Procedure: CORONARY ARTERY BYPASS GRAFTING X THREE, USING LEFT INTERNAL MAMMARY ARTERY, RIGHT ENDOSCOPIC HARVESTED GREATER SAPHENOUS VEIN GRAFT AND LEFT RADIAL ARTERY;  Surgeon: Shyrl Linnie KIDD, MD;  Location: [facility] OR;  Service: Open Heart Surgery;  Laterality: N/A;   HERNIA REPAIR  1996   INTRAOPERATIVE TRANSESOPHAGEAL ECHOCARDIOGRAM N/A 12/28/2023   Procedure: ECHOCARDIOGRAM, TRANSESOPHAGEAL, INTRAOPERATIVE;  Surgeon: Shyrl Linnie KIDD, MD;  Location: [facility]  OR;  Service: Open Heart Surgery;  Laterality: N/A;   LEFT HEART CATH AND CORONARY ANGIOGRAPHY N/A 12/24/2023   Procedure: LEFT HEART CATH AND CORONARY ANGIOGRAPHY;  Surgeon: Wendel Lurena POUR, MD;  Location: [facility] INVASIVE CV LAB;  Service: Cardiovascular;  Laterality: N/A;   RADIAL ARTERY HARVEST Left 12/28/2023   Procedure: SURGICAL PROCUREMENT, ARTERY, RADIAL;  Surgeon: Shyrl Linnie KIDD, MD;  Location: [facility] OR;  Service: Open Heart Surgery;  Laterality: Left;   TRANSTHORACIC ECHOCARDIOGRAM  2010   EF ~65%; borderline LA enlargement     Outpatient Medications Prior to Visit  Medication Sig Dispense Refill   amLODipine  (NORVASC ) 5 MG tablet Take 1 tablet (5 mg total) by mouth daily. 90 tablet 2   amLODipine  (NORVASC ) 5 MG tablet Take 1 tablet (5 mg total) by mouth daily. 90 tablet 3   apixaban  (ELIQUIS ) 5 MG TABS tablet Take 1 tablet (5 mg total) by mouth 2 (two) times daily. 90 tablet 1   apixaban  (ELIQUIS ) 5 MG TABS tablet Take 1 tablet (5 mg total) by mouth 2 (two) times daily. 180 tablet 1   ezetimibe  (ZETIA ) 10 MG tablet Take 1 tablet (10 mg total) by mouth daily. 90 tablet 3   ezetimibe  (ZETIA ) 10 MG tablet Take 1 tablet (10 mg total) by mouth daily. 90 tablet 3   hydrochlorothiazide  (HYDRODIURIL ) 12.5 MG tablet Take 1 tablet (12.5 mg total) by mouth daily. 90 tablet 3   metoprolol  succinate (TOPROL  XL) 25 MG 24 hr tablet Take 1 tablet (25 mg total) by mouth daily. 90 tablet 2   metoprolol  succinate (TOPROL -XL) 25 MG 24 hr tablet Take 1 tablet (25 mg total) by mouth daily. 90 tablet 2   Multiple Vitamins-Minerals (EQ MULTIVITAMINS ADULT GUMMY PO) Take 1 each by mouth daily.     rosuvastatin  (CRESTOR ) 40 MG tablet Take 1 tablet (40 mg total) by mouth daily. 90 tablet 3   sertraline  (ZOLOFT ) 100 MG tablet Take 1 tablet (100 mg total) by mouth daily. Due for physical 90 tablet 2   sertraline  (ZOLOFT ) 100 MG tablet Take 1 tablet (100 mg total) by mouth daily. 90 tablet 2   acetaminophen   (TYLENOL ) 325 MG tablet Take 2 tablets (650 mg total) by mouth every 6 (six) hours.     nitroGLYCERIN  (NITROSTAT ) 0.4 MG SL tablet Place 1 tablet (0.4 mg total) under the tongue every 5 (five) minutes as needed for chest pain. Max 3 tablets per event. (Patient not taking: Reported on 07/10/2024) 90 tablet 3   No facility-administered medications prior to visit.     Allergies:   Simvastatin and Olmesartan     Family History:  The patient's family history includes Depression in his mother; Heart disease in his father, paternal grandfather, and paternal grandmother.   ROS:   Please see the history of present illness.    ROS All other systems reviewed and are negative.   PHYSICAL EXAM:   VS:  BP 105/67 (BP Location: Left Arm, Patient Position: Sitting, Cuff Size: Normal)   Pulse (!) 56   Ht 6' 2 (1.88  m)   Wt 227 lb 12.8 oz (103.3 kg)   SpO2 97%   BMI 29.25 kg/m      General: Alert, oriented x3, no distress, overweight, but appears fit Head: no evidence of trauma, PERRL, EOMI, no exophtalmos or lid lag, no myxedema, no xanthelasma; normal ears, nose and oropharynx Neck: normal jugular venous pulsations and no hepatojugular reflux; brisk carotid pulses without delay and no carotid bruits Chest: clear to auscultation, no signs of consolidation by percussion or palpation, normal fremitus, symmetrical and full respiratory excursions Cardiovascular: normal position and quality of the apical impulse, regular rhythm, normal first and second heart sounds, no murmurs, rubs or gallops Abdomen: no tenderness or distention, no masses by palpation, no abnormal pulsatility or arterial bruits, normal bowel sounds, no hepatosplenomegaly Extremities: no clubbing, cyanosis or edema; 2+ radial, ulnar and brachial pulses bilaterally; 2+ right femoral, posterior tibial and dorsalis pedis pulses; 2+ left femoral, posterior tibial and dorsalis pedis pulses; no subclavian or femoral bruits Neurological: grossly  nonfocal Psych: Normal mood and affect    Wt Readings from Last 3 Encounters:  07/10/24 227 lb 12.8 oz (103.3 kg)  03/16/24 221 lb 3.2 oz (100.3 kg)  02/25/24 224 lb (101.6 kg)      Studies/Labs Reviewed:   EKG:    EKG Interpretation Date/Time:  Monday July 10 2024 08:09:13 EST Ventricular Rate:  58 PR Interval:  162 QRS Duration:  108 QT Interval:  428 QTC Calculation: 420 R Axis:   -17  Text Interpretation: Sinus bradycardia Incomplete left bundle branch block When compared with ECG of 14-Jan-2024 10:48, Questionable change in QRS axis Nonspecific T wave abnormality no longer evident in Lateral leads Confirmed by Chace Bisch ([id number]) on 07/10/2024 8:11:07 AM         Recent Labs: 01/14/2024: ALT 19; Magnesium  2.2 02/10/2024: BUN 11; Creatinine, Ser 1.03; Hemoglobin 12.7; Platelets 155; Potassium 3.6; Sodium 138   Lipid Panel    Component Value Date/Time   CHOL 175 01/31/2024 0834   TRIG 132 01/31/2024 0834   HDL 46 01/31/2024 0834   CHOLHDL 3.8 01/31/2024 0834   CHOLHDL 3 09/23/2022 1619   VLDL 16.2 09/23/2022 1619   LDLCALC 105 (H) 01/31/2024 0834      ASSESSMENT:    1. Coronary artery disease involving native coronary artery of native heart without angina pectoris   2. S/P CABG x 3   3. Palpitations   4. Primary hypertension   5. Mixed hyperlipidemia      PLAN:  In order of problems listed above:  CADs/pCABG: He has made a good recovery.  He is asymptomatic.  Not on aspirin  due to Eliquis  anticoagulation.  On a very low-dose of beta-blocker due to relative bradycardia.  Lipid-lowering therapy with maximum dose rosuvastatin  and ezetimibe .  Based on his progression of disease at a young age would recommend target LDL cholesterol less than 55, which is not achieved based on his most recent labs.  Repeat labs today and also check LP(a).  Low threshold to add PCSK9 inhibitor such as Repatha .   HTN: Excellent blood pressure on the current medications.   He is recommended. HLP: Untreated baseline total cholesterol 271 and LDL cholesterol was 204.  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  We have achieved a 50% reduction in LDL cholesterol, but would ideally would like to bring his LDL down to less than 55.  Check LP(a) today. DVT: This occurred within the few weeks following bypass surgery when he was  less active than usual, but he was not really immobilized.  Hard to characterize this as either a provoked or unprovoked DVT.  Duration of therapy is therefore little ambiguous.  He has completed 3 months of full anticoagulation.  The current dose of Eliquis  that has been taking 5 mg once daily is roughly equivalent to the prophylactic dose of 2.5 mg twice daily.  Will recheck a duplex ultrasound.  If there is complete resolution of the thrombus I would probably recommend continue taking this lower dose of apixaban  to complete 12 months of therapy and then stop it, going back to aspirin  81 mg daily. History of depression: Always aware of his heavy responsibility as a single parent of his 2 daughters, but his mood appears stable and positive at this point.   Medication Adjustments/Labs and Tests Ordered: Current medicines are reviewed at length with the patient today.  Concerns regarding medicines are outlined above.  Medication changes, Labs and Tests ordered today are listed in the Patient Instructions below.   Disposition: Please take Eliquis  5 mg, half tablet twice daily.  Check duplex ultrasound of the lower extremities.  Lipid panel including LP(a) today.  Follow-up in 1 year.   Signed, Jerel Balding, MD  07/10/2024 8:11 AM    [facility] [address] [place], [place], [place]  [address] Phone: [phone]; Fax: [phone]

## 2024-07-10 NOTE — Patient Instructions (Signed)
 Medication Instructions:  No changes *If you need a refill on your cardiac medications before your next appointment, please call your pharmacy*  Lab Work: Lipid panel, LPa If you have labs (blood work) drawn today and your tests are completely normal, you will receive your results only by: MyChart Message (if you have MyChart) OR A paper copy in the mail If you have any lab test that is abnormal or we need to change your treatment, we will call you to review the results.  Testing/Procedures: Your physician has requested that you have a Left lower extremity venous duplex. This test is an ultrasound of the veins in the legs or arms. It looks at venous blood flow that carries blood from the heart to the legs or arms. Allow one hour for a Lower Venous exam. Allow thirty minutes for an Upper Venous exam. There are no restrictions or special instructions.  Please note: We ask at that you not bring children with you during ultrasound (echo/ vascular) testing. Due to room size and safety concerns, children are not allowed in the ultrasound rooms during exams. Our front office staff cannot provide observation of children in our lobby area while testing is being conducted. An adult accompanying a patient to their appointment will only be allowed in the ultrasound room at the discretion of the ultrasound technician under special circumstances. We apologize for any inconvenience.   Follow-Up: At [facility], you and your health needs are our priority.  As part of our continuing mission to provide you with exceptional heart care, our providers are all part of one team.  This team includes your primary Cardiologist (physician) and Advanced Practice Providers or APPs (Physician Assistants and Nurse Practitioners) who all work together to provide you with the care you need, when you need it.  Your next appointment:   1 year(s)  Provider:   Jerel Balding, MD    We recommend signing up for the  patient portal called MyChart.  Sign up information is provided on this After Visit Summary.  MyChart is used to connect with patients for Virtual Visits (Telemedicine).  Patients are able to view lab/test results, encounter notes, upcoming appointments, etc.  Non-urgent messages can be sent to your provider as well.   To learn more about what you can do with MyChart, go to [email or web address].

## 2024-07-11 ENCOUNTER — Ambulatory Visit: Payer: Self-pay | Admitting: Cardiovascular Disease

## 2024-07-11 DIAGNOSIS — E78011 Heterozygous familial hypercholesterolemia (hefh): Secondary | ICD-10-CM | POA: Insufficient documentation

## 2024-07-11 DIAGNOSIS — Z951 Presence of aortocoronary bypass graft: Secondary | ICD-10-CM | POA: Insufficient documentation

## 2024-07-11 DIAGNOSIS — Z86718 Personal history of other venous thrombosis and embolism: Secondary | ICD-10-CM | POA: Insufficient documentation

## 2024-07-11 LAB — LIPOPROTEIN A (LPA): Lipoprotein (a): 236.4 nmol/L — ABNORMAL HIGH (ref <75.0)

## 2024-07-11 LAB — LIPID PANEL
Chol/HDL Ratio: 2.8 ratio (ref 0.0–5.0)
Cholesterol, Total: 162 mg/dL (ref 100–199)
HDL: 58 mg/dL (ref >39)
LDL Chol Calc (NIH): 83 mg/dL (ref 0–99)
Triglycerides: 116 mg/dL (ref 0–149)
VLDL Cholesterol Cal: 21 mg/dL (ref 5–40)

## 2024-07-12 ENCOUNTER — Telehealth: Payer: Self-pay | Admitting: Pharmacy Technician

## 2024-07-12 ENCOUNTER — Other Ambulatory Visit (HOSPITAL_COMMUNITY): Payer: Self-pay

## 2024-07-12 MED ORDER — REPATHA SURECLICK 140 MG/ML ~~LOC~~ SOAJ
140.0000 mg | SUBCUTANEOUS | 3 refills | Status: AC
  Filled 2024-07-12: qty 2, 28d supply, fill #0
  Filled 2024-08-23: qty 2, 28d supply, fill #1

## 2024-07-12 NOTE — Telephone Encounter (Signed)
 Pharmacy Patient Advocate Encounter   Received notification from Physician's Office that prior authorization for Repatha  is required/requested.   Insurance verification completed.   The patient is insured through [facility].   Per test claim: Refill too soon. PA is not needed at this time. Medication was filled 07/12/24. Next eligible fill date is 07/19/24.

## 2024-07-12 NOTE — Telephone Encounter (Signed)
 Repatha  RX sent, lipid panel ordered, sent message to prior auth team. Sent pt [facility] message

## 2024-07-14 ENCOUNTER — Ambulatory Visit: Admitting: Family Medicine

## 2024-07-14 VITALS — BP 104/64 | HR 61 | Temp 97.6°F | Resp 16 | Ht 74.0 in | Wt 227.2 lb

## 2024-07-14 DIAGNOSIS — I10 Essential (primary) hypertension: Secondary | ICD-10-CM

## 2024-07-14 DIAGNOSIS — R21 Rash and other nonspecific skin eruption: Secondary | ICD-10-CM | POA: Diagnosis not present

## 2024-07-14 DIAGNOSIS — E785 Hyperlipidemia, unspecified: Secondary | ICD-10-CM

## 2024-07-14 DIAGNOSIS — I82492 Acute embolism and thrombosis of other specified deep vein of left lower extremity: Secondary | ICD-10-CM | POA: Diagnosis not present

## 2024-07-14 DIAGNOSIS — R252 Cramp and spasm: Secondary | ICD-10-CM

## 2024-07-14 MED ORDER — KETOCONAZOLE 2 % EX CREA: 1.0000 | TOPICAL_CREAM | Freq: Every day | CUTANEOUS | 0 refills | Status: AC

## 2024-07-14 NOTE — Patient Instructions (Addendum)
 A few things to remember from today's visit:  Deep vein thrombosis (DVT) of other vein of left lower extremity, unspecified chronicity (HCC)  Primary hypertension  Hyperlipidemia LDL goal <70  Rash and nonspecific skin eruption  Ketoconazole 2 times daily for up to 3 weeks if not better use over the counter Cortizone cream, small amount 2 times daily for 14 days. Take Eliquis  2 times daily.  If you need refills for medications you take chronically, please call your pharmacy. Do not use My Chart to request refills or for acute issues that need immediate attention. If you send a my chart message, it may take a few days to be addressed, specially if I am not in the office.  Please be sure medication list is accurate. If a new problem present, please set up appointment sooner than planned today.

## 2024-07-14 NOTE — Assessment & Plan Note (Signed)
 Last LDL 83 on 07/10/2024. Since his last visit Repatha  140 mg q. 14 days was added. He is also on rosuvastatin  40 mg daily and Zetia  10 mg daily as well as low-fat diet. Following with cardiologist.

## 2024-07-14 NOTE — Assessment & Plan Note (Addendum)
 BP adequately controlled. Continue amlodipine  5 mg daily and low-salt diet. Following with cardiologist regularly.

## 2024-07-14 NOTE — Assessment & Plan Note (Addendum)
 Last visit I recommended completing 6 months. Increase dose of Eliquis  from 5 mg daily to twice daily. Pending RLE VAS US . Following with cardiologist.

## 2024-07-14 NOTE — Progress Notes (Unsigned)
 "   Chief Complaint  Patient presents with   Medical Management of Chronic Issues    Pt updates his cardiology put him on Repatha  due to lpa is high. Also reports has US  on Monday to see if blood clot dissolve    [name] with a PMHx significant for HTN, HLD, B12 deficiency, anxiety, and CAD  here today for chronic disease management. Last seen on 02/25/24 for ED follow up.                                                                                                                                                                                                                  Discussed the use of AI scribe software for clinical note transcription with the patient, who gave verbal consent to proceed.  History of Present Illness Francisco White is a 53 year old male with a history of blood clots who presents for follow-up regarding anticoagulation management and leg pain.  He experiences persistent cramp-like and intermittent leg pain in the area of the previous clot. No swelling is noted, and he is unsure if there is any visible swelling. An ultrasound is scheduled. Due to travel constraints, he has been taking Eliquis  once daily. He frequently travels to [place], spending three months there and three months in the [place] , involving long travel times and multiple stops. He uses compression stockings and moves around during flights to mitigate the risk of clots.  He has been prescribed Repatha  due to elevated LDL levels, despite being on maximum doses of rosuvastatin  and Zetia . 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 He continues to take rosuvastatin  and Zetia  alongside Repatha .  He has itchy spots on his skin, which he attributes to wearing compression socks for extended periods. He has been using clotrimazole cream, which has helped, but he is unsure if the spots are fungal or just dry skin.  He mentions cramps  since July, and his electrolytes were last checked in July, showing normal potassium levels. He considers using Gatorade for electrolyte balance.   Hypertension:  Medications:*** BP readings at home:*** Side effects:***  Negative for unusual or severe headache, visual changes, exertional chest pain, dyspnea,  focal weakness, or edema.  Lab Results  Component Value Date   CREATININE 1.03 02/10/2024   BUN 11 02/10/2024   NA 138 02/10/2024   K 3.6 02/10/2024   CL 103 02/10/2024   CO2 22 02/10/2024  Hyperlipidemia: Currently on *** Following a low fat diet: ***. Side effects from medication:*** Lab Results  Component Value Date   CHOL 162 07/10/2024   HDL 58 07/10/2024   LDLCALC 83 07/10/2024   TRIG 116 07/10/2024   CHOLHDL 2.8 07/10/2024    Review of Systems See other pertinent positives and negatives in HPI.  Medications Ordered Prior to Encounter[1]  Past Medical History:  Diagnosis Date   CAD (coronary artery disease)    Depression 03/26/2013   Family history of heart disease    History of nuclear stress test 06/2011   exercise; negative for ischemia, normal pattern of perfusion    HTN (hypertension) 03/26/2013   Hyperlipidemia    Hypertension    Skin sensation disturbance 04/07/2017   Umbilical hernia 03/26/2013    Allergies[2]  Social History   Socioeconomic History   Marital status: Married    Spouse name: Not on file   Number of children: 3   Years of education: Not on file   Highest education level: Bachelor's degree (e.g., BA, AB, BS)  Occupational History   Occupation: Quarry Manager  Tobacco Use   Smoking status: Never   Smokeless tobacco: Never  Vaping Use   Vaping status: Never Used  Substance and Sexual Activity   Alcohol use: Yes    Comment: occasional    Drug use: No   Sexual activity: Not on file  Other Topics Concern   Not on file  Social History Narrative   Not on file   Social Drivers of Health   Tobacco Use: Low Risk  (07/10/2024)   Patient History    Smoking Tobacco Use: Never    Smokeless Tobacco Use: Never    Passive Exposure: Not on file  Financial Resource Strain: Medium Risk (07/14/2024)   Overall Financial Resource Strain (CARDIA)    Difficulty of Paying Living Expenses: Somewhat hard  Food Insecurity: No Food Insecurity (07/14/2024)   Epic    Worried About Programme Researcher, Broadcasting/film/video in the Last Year: Never true    Ran Out of Food in the Last Year: Never true  Transportation Needs: No Transportation Needs (07/14/2024)   Epic    Lack of Transportation (Medical): No    Lack of Transportation (Non-Medical): No  Physical Activity: Sufficiently Active (07/14/2024)   Exercise Vital Sign    Days of Exercise per Week: 5 days    Minutes of Exercise per Session: 30 min  Stress: Stress Concern Present (07/14/2024)   [employer] of Occupational Health - Occupational Stress Questionnaire    Feeling of Stress: Very much  Social Connections: Socially Isolated (07/14/2024)   Social Connection and Isolation Panel    Frequency of Communication with Friends and Family: Once a week    Frequency of Social Gatherings with Friends and Family: Once a week    Attends Religious Services: Never    Database Administrator or Organizations: No    Attends Engineer, Structural: Not on file    Marital Status: Widowed  Depression (PHQ2-9): Low Risk (07/14/2024)   Depression (PHQ2-9)    PHQ-2 Score: 0  Alcohol Screen: Not on file  Housing: Low Risk (07/14/2024)   Epic    Unable to Pay for Housing in the Last Year: No    Number of Times Moved in the Last Year: 0    Homeless in the Last Year: No  Utilities: Not At Risk (12/24/2023)   [facility] Utilities    Threatened with loss of utilities: No  Health Literacy: Not on file    Today's Vitals   07/14/24 0902  BP: 104/64  Pulse: 61  Temp: 97.6 F (36.4 C)  TempSrc: Oral  SpO2: 96%  Weight: 227 lb 3.2 oz (103.1 kg)  Height: 6' 2 (1.88 m)    Body mass  index is 29.17 kg/m.  Physical Exam Vitals and nursing note reviewed.  Constitutional:      General: He is not in acute distress.    Appearance: He is well-developed.  HENT:     Head: Normocephalic and atraumatic.     Mouth/Throat:     Mouth: Mucous membranes are moist.     Pharynx: Oropharynx is clear.  Eyes:     Conjunctiva/sclera: Conjunctivae normal.  Cardiovascular:     Rate and Rhythm: Normal rate and regular rhythm.     Pulses:          Dorsalis pedis pulses are 2+ on the right side and 2+ on the left side.     Heart sounds: No murmur heard. Pulmonary:     Effort: Pulmonary effort is normal. No respiratory distress.     Breath sounds: Normal breath sounds.  Abdominal:     Palpations: Abdomen is soft. There is no hepatomegaly or mass.     Tenderness: There is no abdominal tenderness.  Lymphadenopathy:     Cervical: No cervical adenopathy.  Skin:    General: Skin is warm.     Findings: Rash present. No erythema.         Comments: Rounded, mildly erythematous and scaly lesions, about 2.5 cm diameter, not tender.***  Neurological:     Mental Status: He is alert and oriented to person, place, and time.     Cranial Nerves: No cranial nerve deficit.     Gait: Gait normal.  Psychiatric:     Comments: Well groomed, good eye contact.     ASSESSMENT AND PLAN:  Francisco White was seen today for medical management of chronic issues.  Diagnoses and all orders for this visit:   We discussed possible etiologies, he thinks it may be fungal, recommend trying ketoconazole  cream twice daily for up to 21 days.  If lesions do not resolve, he can try OTC Cortizone cream twice daily for 14 days. Follow-up as needed.*** No orders of the defined types were placed in this encounter.   HTN (hypertension) BP adequately controlled. Continue amlodipine  5 mg daily and low-salt diet. Following with cardiologist regularly.  Deep vein thrombosis (DVT) of left lower extremity (HCC) Last  visit I recommended completing 6 months. Increase dose of Eliquis  from 5 mg daily to twice daily. Pending RLE VAS US . Following with cardiologist.  Hyperlipidemia LDL goal <70 Last LDL 83 on 07/10/2024. Since his last visit Repatha  140 mg q. 14 days was added. He is also on rosuvastatin  40 mg daily and Zetia  10 mg daily as well as low-fat diet. Following with cardiologist.  We discussed possible etiologies, recent episode of DVT could be a contributing factor as well as medications. For now we decided not to have blood work done, encouraged adequate intake of electrolytes through his diet. Topical IcyHot may help. He has noted that walking the treadmill daily helps. ***  Return in about 1 year (around 07/14/2025) for CPE.   Jasmyn Picha, MD [facility] [place]. [place] [facility].      [1]  Current Outpatient Medications on File Prior to Visit  Medication Sig Dispense Refill   amLODipine  (NORVASC ) 5 MG tablet Take 1  tablet (5 mg total) by mouth daily. 90 tablet 2   amLODipine  (NORVASC ) 5 MG tablet Take 1 tablet (5 mg total) by mouth daily. 90 tablet 3   apixaban  (ELIQUIS ) 5 MG TABS tablet Take 1 tablet (5 mg total) by mouth 2 (two) times daily. (Patient taking differently: Take 5 mg by mouth daily at 6 (six) AM.) 90 tablet 1   apixaban  (ELIQUIS ) 5 MG TABS tablet Take 1 tablet (5 mg total) by mouth 2 (two) times daily. 180 tablet 1   ezetimibe  (ZETIA ) 10 MG tablet Take 1 tablet (10 mg total) by mouth daily. 90 tablet 3   hydrochlorothiazide  (HYDRODIURIL ) 12.5 MG tablet Take 1 tablet (12.5 mg total) by mouth daily. 90 tablet 3   metoprolol  succinate (TOPROL -XL) 25 MG 24 hr tablet Take 1 tablet (25 mg total) by mouth daily. 90 tablet 2   Multiple Vitamins-Minerals (EQ MULTIVITAMINS ADULT GUMMY PO) Take 1 each by mouth daily.     rosuvastatin  (CRESTOR ) 40 MG tablet Take 1 tablet (40 mg total) by mouth daily. 90 tablet 3   sertraline  (ZOLOFT ) 100 MG tablet Take 1 tablet (100 mg  total) by mouth daily. 90 tablet 2   Evolocumab  (REPATHA  SURECLICK) 140 MG/ML SOAJ Inject 140 mg into the skin every 14 (fourteen) days. 6 mL 3   nitroGLYCERIN  (NITROSTAT ) 0.4 MG SL tablet Place 1 tablet (0.4 mg total) under the tongue every 5 (five) minutes as needed for chest pain. Max 3 tablets per event. (Patient not taking: Reported on 07/14/2024) 90 tablet 3   No current facility-administered medications on file prior to visit.  [2]  Allergies Allergen Reactions   Simvastatin Other (See Comments)    Myalgias    Olmesartan  Other (See Comments)    Dizziness, lightheadedness    "

## 2024-07-15 ENCOUNTER — Encounter: Payer: Self-pay | Admitting: Family Medicine

## 2024-07-17 ENCOUNTER — Other Ambulatory Visit (HOSPITAL_COMMUNITY): Payer: Self-pay

## 2024-07-17 ENCOUNTER — Ambulatory Visit (HOSPITAL_COMMUNITY)
Admission: RE | Admit: 2024-07-17 | Discharge: 2024-07-17 | Disposition: A | Discharge status: Home / Self Care | Source: Ambulatory Visit | Attending: Cardiovascular Disease | Admitting: Cardiovascular Disease

## 2024-07-17 DIAGNOSIS — Z86718 Personal history of other venous thrombosis and embolism: Secondary | ICD-10-CM | POA: Insufficient documentation

## 2024-07-31 ENCOUNTER — Other Ambulatory Visit (HOSPITAL_COMMUNITY): Payer: Self-pay
